# Patient Record
Sex: Female | Born: 1955
Health system: Southern US, Community
[De-identification: ages and names within clinical notes are randomized; demographics above are authoritative.]

## PROBLEM LIST (undated history)

## (undated) DIAGNOSIS — N943 Premenstrual tension syndrome: Secondary | ICD-10-CM

## (undated) DIAGNOSIS — N189 Chronic kidney disease, unspecified: Secondary | ICD-10-CM

## (undated) DIAGNOSIS — I10 Essential (primary) hypertension: Secondary | ICD-10-CM

## (undated) DIAGNOSIS — J309 Allergic rhinitis, unspecified: Secondary | ICD-10-CM

## (undated) DIAGNOSIS — E7800 Pure hypercholesterolemia, unspecified: Secondary | ICD-10-CM

## (undated) DIAGNOSIS — N951 Menopausal and female climacteric states: Secondary | ICD-10-CM

## (undated) DIAGNOSIS — D649 Anemia, unspecified: Secondary | ICD-10-CM

## (undated) DIAGNOSIS — Z8249 Family history of ischemic heart disease and other diseases of the circulatory system: Secondary | ICD-10-CM

## (undated) DIAGNOSIS — E039 Hypothyroidism, unspecified: Secondary | ICD-10-CM

## (undated) DIAGNOSIS — J45909 Unspecified asthma, uncomplicated: Secondary | ICD-10-CM

## (undated) DIAGNOSIS — R519 Headache, unspecified: Secondary | ICD-10-CM

## (undated) DIAGNOSIS — K219 Gastro-esophageal reflux disease without esophagitis: Secondary | ICD-10-CM

## (undated) DIAGNOSIS — R51 Headache: Secondary | ICD-10-CM

## (undated) DIAGNOSIS — E669 Obesity, unspecified: Secondary | ICD-10-CM

## (undated) DIAGNOSIS — R0602 Shortness of breath: Secondary | ICD-10-CM

## (undated) DIAGNOSIS — G8929 Other chronic pain: Secondary | ICD-10-CM

## (undated) HISTORY — DX: Headache, unspecified: R51.9

## (undated) HISTORY — DX: Anemia, unspecified: D64.9

## (undated) HISTORY — DX: Pure hypercholesterolemia, unspecified: E78.00

## (undated) HISTORY — DX: Chronic kidney disease, unspecified: N18.9

## (undated) HISTORY — DX: Allergic rhinitis, unspecified: J30.9

## (undated) HISTORY — DX: Premenstrual tension syndrome: N94.3

## (undated) HISTORY — DX: Shortness of breath: R06.02

## (undated) HISTORY — DX: Family history of ischemic heart disease and other diseases of the circulatory system: Z82.49

## (undated) HISTORY — DX: Other chronic pain: G89.29

## (undated) HISTORY — DX: Obesity, unspecified: E66.9

## (undated) HISTORY — DX: Essential (primary) hypertension: I10

## (undated) HISTORY — DX: Unspecified asthma, uncomplicated: J45.909

## (undated) HISTORY — DX: Headache: R51

## (undated) HISTORY — PX: OTHER SURGICAL HISTORY: SHX169

## (undated) HISTORY — PX: CARDIOVASCULAR STRESS TEST: SHX262

## (undated) HISTORY — DX: Gastro-esophageal reflux disease without esophagitis: K21.9

## (undated) HISTORY — DX: Hypothyroidism, unspecified: E03.9

## (undated) HISTORY — PX: TONSILLECTOMY: SUR1361

## (undated) HISTORY — DX: Menopausal and female climacteric states: N95.1

---

## 2006-12-25 ENCOUNTER — Ambulatory Visit: Payer: Self-pay | Admitting: Family Medicine

## 2006-12-25 LAB — CONVERTED CEMR LAB
AST: 19 units/L (ref 0–37)
Albumin: 4.1 g/dL (ref 3.5–5.2)
BUN: 14 mg/dL (ref 6–23)
Chloride: 104 meq/L (ref 96–112)
GFR calc non Af Amer: 46 mL/min
HDL: 59.1 mg/dL (ref >39.0)
LDL Cholesterol: 59 mg/dL (ref 0–99)
Potassium: 3.5 meq/L (ref 3.5–5.1)
Sodium: 139 meq/L (ref 135–145)
TSH: 3.93 microintl units/mL (ref 0.35–5.50)
VLDL: 27 mg/dL (ref 0–40)

## 2007-01-08 ENCOUNTER — Other Ambulatory Visit: Admission: RE | Admit: 2007-01-08 | Discharge: 2007-01-08 | Payer: Self-pay | Admitting: Family Medicine

## 2007-01-08 ENCOUNTER — Encounter: Payer: Self-pay | Admitting: Family Medicine

## 2007-01-08 ENCOUNTER — Ambulatory Visit: Payer: Self-pay | Admitting: Family Medicine

## 2007-01-08 LAB — CONVERTED CEMR LAB: Pap Smear: NORMAL

## 2007-01-17 ENCOUNTER — Ambulatory Visit: Payer: Self-pay | Admitting: Family Medicine

## 2007-01-21 ENCOUNTER — Encounter: Payer: Self-pay | Admitting: Family Medicine

## 2007-01-21 DIAGNOSIS — J309 Allergic rhinitis, unspecified: Secondary | ICD-10-CM | POA: Insufficient documentation

## 2007-01-21 DIAGNOSIS — N951 Menopausal and female climacteric states: Secondary | ICD-10-CM | POA: Insufficient documentation

## 2007-01-21 DIAGNOSIS — K219 Gastro-esophageal reflux disease without esophagitis: Secondary | ICD-10-CM | POA: Insufficient documentation

## 2007-01-21 DIAGNOSIS — J45909 Unspecified asthma, uncomplicated: Secondary | ICD-10-CM | POA: Insufficient documentation

## 2007-01-21 DIAGNOSIS — N943 Premenstrual tension syndrome: Secondary | ICD-10-CM | POA: Insufficient documentation

## 2007-01-21 DIAGNOSIS — E7800 Pure hypercholesterolemia, unspecified: Secondary | ICD-10-CM

## 2007-01-21 DIAGNOSIS — I10 Essential (primary) hypertension: Secondary | ICD-10-CM | POA: Insufficient documentation

## 2007-02-01 ENCOUNTER — Ambulatory Visit: Payer: Self-pay | Admitting: Family Medicine

## 2007-02-08 ENCOUNTER — Encounter: Payer: Self-pay | Admitting: Family Medicine

## 2007-04-05 ENCOUNTER — Encounter: Payer: Self-pay | Admitting: Family Medicine

## 2007-05-22 ENCOUNTER — Ambulatory Visit: Payer: Self-pay | Admitting: Family Medicine

## 2007-05-22 DIAGNOSIS — E039 Hypothyroidism, unspecified: Secondary | ICD-10-CM

## 2007-05-23 LAB — CONVERTED CEMR LAB
T3 Uptake Ratio: 38.9 % — ABNORMAL HIGH (ref 22.5–37.0)
T3, Free: 2.6 pg/mL (ref 2.3–4.2)

## 2007-08-05 ENCOUNTER — Encounter: Payer: Self-pay | Admitting: Family Medicine

## 2007-08-13 ENCOUNTER — Encounter: Payer: Self-pay | Admitting: Family Medicine

## 2007-09-04 ENCOUNTER — Telehealth: Payer: Self-pay | Admitting: Family Medicine

## 2007-11-18 ENCOUNTER — Ambulatory Visit: Payer: Self-pay | Admitting: Family Medicine

## 2007-11-22 LAB — CONVERTED CEMR LAB
AST: 24 units/L (ref 0–37)
Albumin: 3.9 g/dL (ref 3.5–5.2)
Alkaline Phosphatase: 56 units/L (ref 39–117)
Total Bilirubin: 0.5 mg/dL (ref 0.3–1.2)

## 2008-02-11 ENCOUNTER — Telehealth: Payer: Self-pay | Admitting: Family Medicine

## 2008-03-11 ENCOUNTER — Telehealth: Payer: Self-pay | Admitting: Family Medicine

## 2008-03-23 ENCOUNTER — Ambulatory Visit: Payer: Self-pay | Admitting: Family Medicine

## 2008-03-25 ENCOUNTER — Telehealth: Payer: Self-pay | Admitting: Family Medicine

## 2008-05-07 ENCOUNTER — Ambulatory Visit: Payer: Self-pay | Admitting: Family Medicine

## 2008-05-13 ENCOUNTER — Ambulatory Visit: Payer: Self-pay | Admitting: Family Medicine

## 2008-05-13 ENCOUNTER — Encounter: Payer: Self-pay | Admitting: Family Medicine

## 2008-05-14 ENCOUNTER — Encounter (INDEPENDENT_AMBULATORY_CARE_PROVIDER_SITE_OTHER): Payer: Self-pay | Admitting: *Deleted

## 2008-10-30 ENCOUNTER — Ambulatory Visit: Payer: Self-pay | Admitting: Family Medicine

## 2009-03-24 ENCOUNTER — Encounter: Payer: Self-pay | Admitting: Family Medicine

## 2009-06-08 ENCOUNTER — Ambulatory Visit: Payer: Self-pay | Admitting: Family Medicine

## 2009-06-09 ENCOUNTER — Encounter: Payer: Self-pay | Admitting: Family Medicine

## 2009-07-23 ENCOUNTER — Ambulatory Visit: Payer: Self-pay | Admitting: Family Medicine

## 2009-07-23 DIAGNOSIS — H6060 Unspecified chronic otitis externa, unspecified ear: Secondary | ICD-10-CM

## 2009-08-03 ENCOUNTER — Ambulatory Visit: Payer: Self-pay | Admitting: Family Medicine

## 2009-08-04 ENCOUNTER — Encounter (INDEPENDENT_AMBULATORY_CARE_PROVIDER_SITE_OTHER): Payer: Self-pay | Admitting: *Deleted

## 2009-08-11 ENCOUNTER — Encounter: Payer: Self-pay | Admitting: Family Medicine

## 2009-08-11 ENCOUNTER — Ambulatory Visit: Payer: Self-pay | Admitting: Family Medicine

## 2009-08-18 ENCOUNTER — Encounter (INDEPENDENT_AMBULATORY_CARE_PROVIDER_SITE_OTHER): Payer: Self-pay | Admitting: *Deleted

## 2009-09-16 ENCOUNTER — Ambulatory Visit: Payer: Self-pay | Admitting: Family Medicine

## 2009-09-17 LAB — CONVERTED CEMR LAB
ALT: 20 units/L (ref 0–35)
AST: 24 units/L (ref 0–37)
Bilirubin, Direct: 0 mg/dL (ref 0.0–0.3)
Chloride: 102 meq/L (ref 96–112)
Potassium: 3.3 meq/L — ABNORMAL LOW (ref 3.5–5.1)
Total Bilirubin: 0.8 mg/dL (ref 0.3–1.2)
Total CHOL/HDL Ratio: 3
VLDL: 41.2 mg/dL — ABNORMAL HIGH (ref 0.0–40.0)

## 2010-04-05 ENCOUNTER — Encounter: Payer: Self-pay | Admitting: Family Medicine

## 2010-05-03 ENCOUNTER — Telehealth: Payer: Self-pay | Admitting: Family Medicine

## 2010-05-04 ENCOUNTER — Encounter (INDEPENDENT_AMBULATORY_CARE_PROVIDER_SITE_OTHER): Payer: Self-pay | Admitting: *Deleted

## 2010-05-12 ENCOUNTER — Encounter (INDEPENDENT_AMBULATORY_CARE_PROVIDER_SITE_OTHER): Payer: Self-pay | Admitting: *Deleted

## 2010-05-12 LAB — CONVERTED CEMR LAB
AST: 21 units/L
Alkaline Phosphatase: 69 units/L
Calcium: 9.9 mg/dL
Creatinine, Ser: 1.19 mg/dL
Magnesium: 1.84 mg/dL
Total Bilirubin: 0.4 mg/dL
Triglyceride fasting, serum: 294 mg/dL — ABNORMAL HIGH

## 2010-08-11 LAB — HM MAMMOGRAPHY: HM Mammogram: NORMAL

## 2010-11-01 ENCOUNTER — Encounter: Payer: Self-pay | Admitting: Family Medicine

## 2010-11-01 ENCOUNTER — Other Ambulatory Visit: Payer: Self-pay | Admitting: Family Medicine

## 2010-11-01 ENCOUNTER — Telehealth (INDEPENDENT_AMBULATORY_CARE_PROVIDER_SITE_OTHER): Payer: Self-pay | Admitting: *Deleted

## 2010-11-01 ENCOUNTER — Ambulatory Visit
Admission: RE | Admit: 2010-11-01 | Discharge: 2010-11-01 | Payer: Self-pay | Source: Home / Self Care | Attending: Family Medicine | Admitting: Family Medicine

## 2010-11-01 LAB — HEPATIC FUNCTION PANEL
ALT: 37 U/L — ABNORMAL HIGH (ref 0–35)
AST: 41 U/L — ABNORMAL HIGH (ref 0–37)
Alkaline Phosphatase: 60 U/L (ref 39–117)
Bilirubin, Direct: 0 mg/dL (ref 0.0–0.3)
Total Protein: 7.1 g/dL (ref 6.0–8.3)

## 2010-11-01 LAB — BASIC METABOLIC PANEL
CO2: 31 mEq/L (ref 19–32)
Calcium: 9.3 mg/dL (ref 8.4–10.5)
Chloride: 100 mEq/L (ref 96–112)
Potassium: 3.7 mEq/L (ref 3.5–5.1)
Sodium: 140 mEq/L (ref 135–145)

## 2010-11-01 LAB — CONVERTED CEMR LAB: LDL Cholesterol: 30 mg/dL

## 2010-11-01 LAB — LIPID PANEL: Total CHOL/HDL Ratio: 2

## 2010-11-08 NOTE — Miscellaneous (Signed)
Summary: med list update- change from vytorin to crestor  Clinical Lists Changes  Medications: Changed medication from VYTORIN 10-20 MG TABS (EZETIMIBE-SIMVASTATIN) Take 1 tablet by mouth once a day to CRESTOR 40 MG TABS (ROSUVASTATIN CALCIUM) take one by mouth at bedtime     Prior Medications: HYDROCHLOROTHIAZIDE 25 MG TABS (HYDROCHLOROTHIAZIDE) Take 1 tablet by mouth once a day IMITREX 20 MG/ACT SOLN (SUMATRIPTAN) Spray 1 spray into one nostril once a day ATENOLOL 50 MG  TABS (ATENOLOL) Take 1 tablet by mouth once a day PRILOSEC OTC 20 MG  TBEC (OMEPRAZOLE MAGNESIUM) .Take 1 tablet by mouth once a day CALCIUM CARBONATE-VITAMIN D 600-400 MG-UNIT  TABS (CALCIUM CARBONATE-VITAMIN D) Take 1 tablet by mouth once a day DICLOFENAC SODIUM 75 MG TBEC (DICLOFENAC SODIUM) 1 tab by mouth two times a day HYDROCORTISONE-ACETIC ACID 1-2 % SOLN (HYDROCORTISONE-ACETIC ACID) 5 gtt in right ear three times a day x 5-7 days Current Allergies: * PENICILLINS GROUP * SULFA (SULFONAMIDES) GROUP

## 2010-11-08 NOTE — Progress Notes (Signed)
Summary: prior Berkley Harvey is needed for vytorin  Phone Note From Pharmacy   Caller: [facility]  [phone]*/ [facility] Summary of Call: Prior Berkley Harvey is needed for vytorin, form is on your desk.  Insurance prefers [employer]. Initial call taken by: Lowella Petties CMA,  May 03, 2010 1:19 PM  Follow-up for Phone Call        let patient know  Insurance company and I agree. High potency statin drugs have been found to be more favorable in preventing cardiac events - the 1 component in Vytorin (Zetia) has not been found to be helpful.  Would recommend med change. Follow-up by: Hannah Beat MD,  May 03, 2010 1:30 PM  Additional Follow-up for Phone Call Additional follow up Details #1::        Advised pt, she is agreeable to change.             Lowella Petties CMA  May 04, 2010 11:04 AM

## 2010-11-08 NOTE — Miscellaneous (Signed)
  Clinical Lists Changes  Observations: Added new observation of MAGNESIUM: 1.84 mg/dL (91/47/8295 6:21) Added new observation of CALCIUM: 9.9 mg/dL (30/86/5784 6:96) Added new observation of CREATININE: 1.19 mg/dL (29/52/8413 2:44) Added new observation of BUN: 23 mg/dL (10/11/7251 6:64) Added new observation of CL SERUM: 103 meq/L (05/12/2010 8:19) Added new observation of K SERUM: 4.2 meq/L (05/12/2010 8:19) Added new observation of NA: 142 meq/L (05/12/2010 8:19) Added new observation of BG RANDOM: 96 mg/dL (40/34/7425 9:56) Added new observation of TSH: 4.94 microintl units/mL (05/12/2010 8:19) Added new observation of BILI TOTAL: 0.4 mg/dL (38/75/6433 2:95) Added new observation of ALK PHOS: 69 units/L (05/12/2010 8:19) Added new observation of SGPT (ALT): 14 units/L (05/12/2010 8:19) Added new observation of SGOT (AST): 21 units/L (05/12/2010 8:19) Added new observation of TRIGLYCERIDE: 294 mg/dL (18/84/1660 6:30) Added new observation of LDL: 146 mg/dL (16/10/930 3:55) Added new observation of HDL: 53 mg/dL (73/22/0254 2:70) Added new observation of CHOLESTEROL: 258 mg/dL (62/37/6283 1:51)

## 2010-11-10 NOTE — Progress Notes (Signed)
----   Converted from flag ---- ---- 10/31/2010 10:54 PM, Kerby Nora MD wrote: Francisca December, lipids Dx 401.1  ---- 10/28/2010 11:31 AM, Liane Comber CMA (AAMA) wrote: Lab orders please! Good Morning! This pt is scheduled for cpx labs Tuesday, which labs to draw and dx codes to use? Thanks Tasha ------------------------------

## 2010-12-02 ENCOUNTER — Other Ambulatory Visit: Payer: Self-pay | Admitting: Family Medicine

## 2010-12-02 ENCOUNTER — Other Ambulatory Visit (HOSPITAL_COMMUNITY)
Admission: RE | Admit: 2010-12-02 | Discharge: 2010-12-02 | Disposition: A | Discharge status: Home / Self Care | Payer: Managed Care, Other (non HMO) | Source: Ambulatory Visit | Attending: Family Medicine | Admitting: Family Medicine

## 2010-12-02 ENCOUNTER — Encounter (INDEPENDENT_AMBULATORY_CARE_PROVIDER_SITE_OTHER): Payer: Managed Care, Other (non HMO) | Admitting: Family Medicine

## 2010-12-02 ENCOUNTER — Encounter: Payer: Self-pay | Admitting: Family Medicine

## 2010-12-02 DIAGNOSIS — Z01419 Encounter for gynecological examination (general) (routine) without abnormal findings: Secondary | ICD-10-CM

## 2010-12-02 DIAGNOSIS — R740 Nonspecific elevation of levels of transaminase and lactic acid dehydrogenase [LDH]: Secondary | ICD-10-CM

## 2010-12-02 DIAGNOSIS — Z1159 Encounter for screening for other viral diseases: Secondary | ICD-10-CM | POA: Insufficient documentation

## 2010-12-02 DIAGNOSIS — Z0000 Encounter for general adult medical examination without abnormal findings: Secondary | ICD-10-CM

## 2010-12-02 DIAGNOSIS — L408 Other psoriasis: Secondary | ICD-10-CM | POA: Insufficient documentation

## 2010-12-02 LAB — CONVERTED CEMR LAB
Cholesterol, target level: 200 mg/dL
HDL goal, serum: 40 mg/dL
LDL Goal: 160 mg/dL

## 2010-12-02 LAB — HM PAP SMEAR

## 2010-12-06 NOTE — Assessment & Plan Note (Signed)
Summary: CPX/AETNA/CYD   Vital Signs:  Patient profile:   55 year old female Height:      67 inches Weight:      201 pounds BMI:     31.59 Temp:     98.3 degrees F oral Pulse rate:   74 / minute Pulse rhythm:   regular BP sitting:   120 / 77  (left arm) Cuff size:   large  Vitals Entered By: Melody Comas (December 02, 2010 2:21 PM) CC: cpx , Lipid Management   History of Present Illness: The patient is here for annual wellness exam and preventative care.    Last seen this pt for CPX in 2010.   She has the following chronic issues:  HTN, well controlled on atenolol and HCTZ.Marland Kitchen good numbers at home as well.  Hypothyroid: well controlled. Last TSH 4.94 05/2010  Has started daily exercsie routine.  Lipid Management History:      Positive NCEP/ATP III risk factors include hypertension.  Negative NCEP/ATP III risk factors include female age less than 66 years old and non-tobacco-user status.        Comments include: LDL very low.. consider decreasing dose from such a high level given elevation of LFTS.  The patient notes symptoms to suggest myopathy or liver disease.  Comments include: Elevated LFTs on crestor 40 mg daily .  Comments: Triglcyerides remain high.   Problems Prior to Update: 1)  Otitis Externa, Chronic  (ICD-380.23) 2)  Trochanteric Bursitis, Left  (ICD-726.5) 3)  Hip Pain, Left  (ICD-719.45) 4)  Routine Gynecological Examination  (ICD-V72.31) 5)  Other Screening Mammogram  (ICD-V76.12) 6)  Well Adult Exam  (ICD-V70.0) 7)  Hypothyroidism Nos  (ICD-244.9) 8)  Family History of Cad Female 1st Degree Relative <50  (ICD-V17.3) 9)  Renal Insufficiency, Chronic 1.2 - 1.3  (ICD-585.9) 10)  Obesity, Bmi 31  (ICD-278.00) 11)  Gerd  (ICD-530.81) 12)  Asthma, Childhood  (ICD-493.00) 13)  Allergic Rhinitis  (ICD-477.9) 14)  Menopausal Syndrome  (ICD-627.2) 15)  Migraine, Menstrual  (ICD-625.4) 16)  Hypertension  (ICD-401.9) 17)  Hypercholesterolemia   (ICD-272.0)  Current Medications (verified): 1)  Hydrochlorothiazide 25 Mg Tabs (Hydrochlorothiazide) .... Take 1 Tablet By Mouth Once A Day 2)  Crestor 40 Mg Tabs (Rosuvastatin Calcium) .... Take One By Mouth At Bedtime 3)  Imitrex 20 Mg/act Soln (Sumatriptan) .... Spray 1 Spray Into One Nostril Once A Day 4)  Atenolol 50 Mg  Tabs (Atenolol) .... Take 1 Tablet By Mouth Once A Day 5)  Prilosec Otc 20 Mg  Tbec (Omeprazole Magnesium) .... .take 1 Tablet By Mouth Once A Day 6)  Calcium Carbonate-Vitamin D 600-400 Mg-Unit  Tabs (Calcium Carbonate-Vitamin D) .... Take 1 Tablet By Mouth Once A Day 7)  Diclofenac Sodium 75 Mg Tbec (Diclofenac Sodium) .Marland Kitchen.. 1 Tab By Mouth Two Times A Day 8)  Hydrocortisone-Acetic Acid 1-2 % Soln (Hydrocortisone-Acetic Acid) .... 5 Gtt in Right Ear Three Times A Day X 5-7 Days 9)  Fluocinonide 0.05 % Crea (Fluocinonide) .... Apply To Scalp 1 To 2 Times Daily  Allergies: 1)  * Penicillins Group 2)  * Sulfa (Sulfonamides) Group  Past History:  Past medical, surgical, family and social histories (including risk factors) reviewed, and no changes noted (except as noted below).  Past Medical History: Reviewed history from 10/30/2008 and no changes required.  HYPOTHYROIDISM NOS (ICD-244.9) FAMILY HISTORY OF CAD FEMALE 1ST DEGREE RELATIVE <50 (ICD-V17.3) RENAL INSUFFICIENCY, CHRONIC  1.2 - 1.3 (ICD-585.9) OBESITY, BMI 31 (ICD-278.00)  GERD (ICD-530.81) ASTHMA, CHILDHOOD (ICD-493.00) ALLERGIC RHINITIS (ICD-477.9) MENOPAUSAL SYNDROME (ICD-627.2) MIGRAINE, MENSTRUAL (ICD-625.4) HYPERTENSION (ICD-401.9) HYPERCHOLESTEROLEMIA (ICD-272.0)    Past Surgical History: Reviewed history from 01/21/2007 and no changes required. 1975 TONSILLECTOMY 1996 EXP. LAP. - LEFT OVARIAN REPAIR 2004 EGD:  GERD  Family History: Reviewed history from 01/21/2007 and no changes required. Father: ALIVE 102 CAD, CABG, DM, MI AT AGE 5 Mother: DIED 18, HTN, CVA, DM Siblings: 3 BROTHERS HTN,  HTN, DRUG ABUSE CV:  PATERNAL SIDE Family History of CAD Female 1st degree relative  Social History: Reviewed history from 01/21/2007 and no changes required. Marital Status: Married Children: 2 GROWN, HEALTHY, FIBROMYALGIA Occupation: Clinical biochemist, [employer] DIET:  OCC. FAST FOOD, FRUITS AND VEGGIES, + H20 Never Smoked Alcohol use-no Regular exercise-no  Review of Systems General:  Denies fatigue and fever. CV:  Denies chest pain or discomfort. Resp:  Denies shortness of breath. GI:  Denies abdominal pain, bloody stools, and constipation. GU:  Denies abnormal vaginal bleeding and dysuria. MS:  intermittant B hip pain.Marland Kitchen in past has had trochanteric bursitis.Marland Kitchen Psych:  Denies anxiety and depression.  Physical Exam  General:  overweight appearing female in NAD  Eyes:  No corneal or conjunctival inflammation noted. EOMI. Perrla. Funduscopic exam benign, without hemorrhages, exudates or papilledema. Vision grossly normal. Ears:  External ear exam shows no significant lesions or deformities.  Otoscopic examination reveals clear canals, tympanic membranes are intact bilaterally without bulging, retraction, inflammation or discharge. Hearing is grossly normal bilaterally. Nose:  External nasal examination shows no deformity or inflammation. Nasal mucosa are pink and moist without lesions or exudates. Mouth:  Oral mucosa and oropharynx without lesions or exudates.  Teeth in good repair. Neck:  no carotid bruit or thyromegaly no cervical or supraclavicular lymphadenopathy  Chest Wall:  No deformities, masses, or tenderness noted. Breasts:  No mass, nodules, thickening, tenderness, bulging, retraction, inflamation, nipple discharge or skin changes noted.   Lungs:  Normal respiratory effort, chest expands symmetrically. Lungs are clear to auscultation, no crackles or wheezes. Heart:  Normal rate and regular rhythm. S1 and S2 normal without gallop, murmur, click, rub or other extra  sounds. Abdomen:  Bowel sounds positive,abdomen soft and non-tender without masses, organomegaly or hernias noted. Genitalia:  Pelvic Exam:        External: normal female genitalia without lesions or masses        Vagina: normal without lesions or masses        Cervix: normal without lesions or masses        Adnexa: normal bimanual exam without masses or fullness        Uterus: normal by palpation        Pap smear: performed Msk:  No deformity or scoliosis noted of thoracic or lumbar spine.   ttp B trochanteric bursa.  Pulses:  R and L posterior tibial pulses are full and equal bilaterally  Extremities:  no edema Skin:  Intact without suspicious lesions or rashes Psych:  Cognition and judgment appear intact. Alert and cooperative with normal attention span and concentration. No apparent delusions, illusions, hallucinations   Impression & Recommendations:  Problem # 1:  WELL ADULT EXAM (ICD-V70.0) The patient's preventative maintenance and recommended screening tests for an annual wellness exam were reviewed in full today. Brought up to date unless services declined.  Counselled on the importance of diet, exercise, and its role in overall health and mortality. The patient's FH and SH was reviewed, including their home life, tobacco status, and drug  and alcohol status.     Problem # 2:  ROUTINE GYNECOLOGICAL EXAMINATION (ICD-V72.31) PAP pending.   Problem # 3:  HYPOTHYROIDISM NOS (ICD-244.9) Well controlled. Continue current medication.   Problem # 4:  HYPERTENSION (ICD-401.9) Well controlled. Continue current medication.  Her updated medication list for this problem includes:    Hydrochlorothiazide 25 Mg Tabs (Hydrochlorothiazide) .Marland Kitchen... Take 1 tablet by mouth once a day    Atenolol 50 Mg Tabs (Atenolol) .Marland Kitchen... Take 1 tablet by mouth once a day  Problem # 5:  HYPERCHOLESTEROLEMIA (ICD-272.0) Slight increase in LFTS... goal LDL simply <100-130... will decrease to crestor 20 mg  daily to see if chol remoians at goal on lower dose and LFTs return to nml.  Her updated medication list for this problem includes:    Crestor 40 Mg Tabs (Rosuvastatin calcium) .Marland Kitchen... Change to 1/2 tab by mouth daily  Problem # 6:  RENAL INSUFFICIENCY, CHRONIC  1.2 - 1.3 (ICD-585.9) Stable.   Complete Medication List: 1)  Hydrochlorothiazide 25 Mg Tabs (Hydrochlorothiazide) .... Take 1 tablet by mouth once a day 2)  Crestor 40 Mg Tabs (Rosuvastatin calcium) .... Change to 1/2 tab by mouth daily 3)  Imitrex 20 Mg/act Soln (Sumatriptan) .... Spray 1 spray into one nostril once a day 4)  Atenolol 50 Mg Tabs (Atenolol) .... Take 1 tablet by mouth once a day 5)  Prilosec Otc 20 Mg Tbec (Omeprazole magnesium) .... .take 1 tablet by mouth once a day 6)  Calcium Carbonate-vitamin D 600-400 Mg-unit Tabs (Calcium carbonate-vitamin d) .... Take 1 tablet by mouth once a day 7)  Diclofenac Sodium 75 Mg Tbec (Diclofenac sodium) .Marland Kitchen.. 1 tab by mouth two times a day 8)  Hydrocortisone-acetic Acid 1-2 % Soln (Hydrocortisone-acetic acid) .... 5 gtt in right ear three times a day x 5-7 days 9)  Fluocinonide 0.05 % Crea (Fluocinonide) .... Apply to scalp 1 to 2 times daily  Other Orders: Radiology Referral (Radiology)  Lipid Assessment/Plan:      Based on NCEP/ATP III, the patient's risk factor category is "0-1 risk factors".  The patient's lipid goals are as follows: Total cholesterol goal is 200; LDL cholesterol goal is 160; HDL cholesterol goal is 40; Triglyceride goal is 150.  Her LDL cholesterol goal has been met.    Patient Instructions: 1)  Start back fish oil 2000 mg daily... try orange or lemon.  2)  Decrease crestor to 1/2 tab by mouth daily. 3)  Recheck fasting LIPIDS, AST, ALT  in 3 months Dx 272.0    4)  Work on [employer] and weight loss. Low chol low fat diet. 5)   Referral Appointment Information 6)  Day/Date: 7)  Time: 8)  Place/MD: 9)  Address: 10)  Phone/Fax: 11)  Patient given  appointment information. Information/Orders faxed/mailed.    Orders Added: 1)  Radiology Referral [Radiology] 2)  Est. Patient 40-64 years [[id number]]    Current Allergies (reviewed today): * PENICILLINS GROUP * SULFA (SULFONAMIDES) GROUP   Past Medical History:    Reviewed history from 10/30/2008 and no changes required:              HYPOTHYROIDISM NOS (ICD-244.9)       FAMILY HISTORY OF CAD FEMALE 1ST DEGREE RELATIVE <50 (ICD-V17.3)       RENAL INSUFFICIENCY, CHRONIC  1.2 - 1.3 (ICD-585.9)       OBESITY, BMI 31 (ICD-278.00)       GERD (ICD-530.81)       ASTHMA, CHILDHOOD (ICD-493.00)  ALLERGIC RHINITIS (ICD-477.9)       MENOPAUSAL SYNDROME (ICD-627.2)       MIGRAINE, MENSTRUAL (ICD-625.4)       HYPERTENSION (ICD-401.9)       HYPERCHOLESTEROLEMIA (ICD-272.0)          Past Surgical History:    Reviewed history from 01/21/2007 and no changes required:       1975 TONSILLECTOMY       1996 EXP. LAP. - LEFT OVARIAN REPAIR       2004 EGD:  GERD   Flu Vaccine Next Due:  Refused Last LDL:  146 (05/12/2010 8:19:28 AM) LDL Result Date:  11/01/2010 LDL Result:  30 LDL Next Due:  12 wk

## 2010-12-06 NOTE — Letter (Signed)
Summary: [facility]: Immunoassay Fecal Occult Blood (iFOB) Order Form  [place] [facility]  [address] [place], [place] [address]   Phone: [phone]  Fax: [phone]      [facility]: Immunoassay Fecal Occult Blood (iFOB) Order Form   December 02, 2010 MRN: [id number]   SOJOURNER BEHRINGER [birth year]   Physicican Name:_______________bedsole__________  Diagnosis Code:________________v76.41__________      Kerby Nora MD

## 2010-12-12 ENCOUNTER — Encounter (INDEPENDENT_AMBULATORY_CARE_PROVIDER_SITE_OTHER): Payer: Self-pay | Admitting: *Deleted

## 2010-12-20 NOTE — Letter (Signed)
Summary: Results Follow up Letter  [place] [facility]  [address] [place], [place] [address]   Phone: [phone]  Fax: [phone]    12/12/2010 MRN: [id number]     [facility] [address] [place], [place]  [address]  [place]    Dear Ms. BAZIN,  The following are the results of your recent test(s):  Test         Result    Pap Smear:        Normal __x___  Not Normal _____ Comments:Repeat in 1 year ______________________________________________________ Cholesterol: LDL(Bad cholesterol):         Your goal is less than:         HDL (Good cholesterol):       Your goal is more than: Comments:  ______________________________________________________ Mammogram:        Normal _____  Not Normal _____ Comments:  ___________________________________________________________________ Hemoccult:        Normal _____  Not normal _______ Comments:    _____________________________________________________________________ Other Tests:    We routinely do not discuss normal results over the telephone.  If you desire a copy of the results, or you have any questions about this information we can discuss them at your next office visit.   Sincerely,  Kerby Nora MD

## 2011-01-04 ENCOUNTER — Other Ambulatory Visit: Payer: Managed Care, Other (non HMO)

## 2011-01-04 ENCOUNTER — Other Ambulatory Visit (INDEPENDENT_AMBULATORY_CARE_PROVIDER_SITE_OTHER): Payer: Managed Care, Other (non HMO) | Admitting: Family Medicine

## 2011-01-04 DIAGNOSIS — Z1211 Encounter for screening for malignant neoplasm of colon: Secondary | ICD-10-CM

## 2011-01-04 LAB — FECAL OCCULT BLOOD, IMMUNOCHEMICAL: Fecal Occult Bld: NEGATIVE

## 2011-01-26 ENCOUNTER — Other Ambulatory Visit: Payer: Self-pay | Admitting: Podiatry

## 2011-01-30 ENCOUNTER — Ambulatory Visit: Payer: Self-pay | Admitting: Family Medicine

## 2011-02-21 ENCOUNTER — Encounter: Payer: Self-pay | Admitting: Family Medicine

## 2011-02-23 ENCOUNTER — Ambulatory Visit (INDEPENDENT_AMBULATORY_CARE_PROVIDER_SITE_OTHER): Payer: Managed Care, Other (non HMO) | Admitting: Family Medicine

## 2011-02-23 ENCOUNTER — Encounter: Payer: Self-pay | Admitting: Family Medicine

## 2011-02-23 VITALS — BP 110/80 | HR 60 | Temp 97.8°F | Ht 67.0 in | Wt 199.8 lb

## 2011-02-23 DIAGNOSIS — M21619 Bunion of unspecified foot: Secondary | ICD-10-CM

## 2011-02-23 DIAGNOSIS — M2020 Hallux rigidus, unspecified foot: Secondary | ICD-10-CM

## 2011-02-23 DIAGNOSIS — M109 Gout, unspecified: Secondary | ICD-10-CM | POA: Insufficient documentation

## 2011-02-23 MED ORDER — ALLOPURINOL 300 MG PO TABS: 300.0000 mg | ORAL_TABLET | Freq: Every day | ORAL | Status: DC

## 2011-02-23 MED ORDER — ALLOPURINOL 100 MG PO TABS: ORAL_TABLET | ORAL | Status: DC

## 2011-02-23 NOTE — Patient Instructions (Signed)
Allopurinol 100 mg, take one tablet for 2 weeks, then increase to 2 tablets a day for 2 weeks.  Then fill the refill: 300 mg tablet once a day

## 2011-02-23 NOTE — Progress Notes (Signed)
55 year old, f/u gout:  Since March, started slowly, went to see Dr. Ether Griffins.  Went on a week of steroids, and then on the thrid visit. Found out it wsa gout.   Better, but it wsa still not great.  Right now, improved but some baseline MTP pain  Gout: Patient here for evaluation of acute gouty arthritis. The patient reports 3 attacks involving the left first MTP joint. Attacks occur primarily in the left first MTP joint. Patient reports her chronic pain is improved, her joint stiffness is ongoing and her joint swelling is improved. Limitation on activities include none. The patient is not avoiding high purine foods and reports consuming 0 alcoholic drinks per days.  Crystal proven gout on aspirate. S/p rounds of colchicine, NSAIDS, oral steroids, most recently with intraarticular MTP injection. Mostly improved, but patient has her baseline pain and a baseline significant bunion and bunionette formation.  The PMH, PSH, Social History, Family History, Medications, and allergies have been reviewed in [facility], and have been updated if relevant.  REVIEW OF SYSTEMS  GEN: No fevers, chills. Nontoxic. Primarily MSK c/o today. MSK: Detailed in the HPI GI: tolerating PO intake without difficulty Neuro: No numbness, parasthesias, or tingling associated. Otherwise the pertinent positives of the ROS are noted above.   GEN: Well-developed,well-nourished,in no acute distress; alert,appropriate and cooperative throughout examination HEENT: Normocephalic and atraumatic without obvious abnormalities. Ears, externally no deformities PULM: Breathing comfortably in no respiratory distress EXT: No clubbing, cyanosis, or edema PSYCH: Normally interactive. Cooperative during the interview. Pleasant. Friendly and conversant. Not anxious or depressed appearing. Normal, full affect.  FEET: B Echymosis: no Edema: no ROM: full LE B Gait: heel toe, non-antalgic MT pain: no Lateral Mall: NT Medial Mall: NT Talus:  NT Navicular: NT Cuboid: NT Calcaneous: NT Metatarsals: NT 5th MT: NT Phalanges: NT Achilles: NT Plantar Fascia: NT Fat Pad: NT Peroneals: NT Post Tib: NT Great Toe: decreased motion, significant bunion, L > R, Mild MTP joint line pain at 1st on L Ant Drawer: neg ATFL: NT CFL: NT Deltoid: NT Sensation: intact  A/P: chronic gout. The patient seems as if she is over her acute flare. We'll start allopurinol and titrate up dosing until 300 mg is achieved. Recheck uric acid level at next office visit.  Significant bunion formation. Notable bunionette formation as well. Significant transverse arch collapse. She is considering bunionectomy. Recommended that she fully discuss surgical outcome literature with operating physician and recovery time.  Hallux rigidus. Suspect a significant amount of her pain is due to chronic changes in the MTP as well as bunion formation as well.  If the patient does acutely flared on allopurinol, in the next few days, we'll have to stop this and then reinitiate aggressive acute gout treatment.

## 2011-02-24 NOTE — Assessment & Plan Note (Signed)
[facility]                           [facility] OFFICE NOTE   NAME:Virginia Wilson, Virginia Wilson                           MRN:          [id number]  DATE:12/25/2006                            DOB:          [birth year]    NEW PATIENT HISTORY AND PHYSICAL:   CHIEF COMPLAINT:  [age]-year-old white female, here to establish new  doctor.   HISTORY OF PRESENT ILLNESS:  [name] moved from [place] with her  husband to be closer to her daughter.  She comes to clinic for the  following chronic issues:   1. Hypercholesterolemia, well-controlled:  She currently takes Vytorin      10/20 mg daily with well-controlled cholesterol.  Her cholesterol      was last evaluated in June 2007 and LDL was 83, HDL 47 and      triglycerides slightly elevated at 193.  She denies any myalgia or      arthralgia on Vytorin.  2. Hypertension, poor control:  She stopped temporarily her blood      pressure medication off and on since she moved, but noticed that      her blood pressure did increase and her migraines did increase, as      well, with the elevation in blood pressure.  She did have a blood      pressure measurement of 153/112.  When she takes the      hydrochlorothiazide 25 mg daily, she states her blood pressure      usually runs 130/90.  She denies any side effects from      hydrochlorothiazide.  She has been out of her medication now for      the last week.  3. Elevated TSH:  She comes to clinic with her last blood test that      shows an elevated TSH of 6.56 that suggests possible      hypothyroidism.  She denies fatigue, cold intolerance, coarse hair      or new dry skin.  She is unsure of any family history of low      thyroid.  She states that it had been elevated somewhat, but it was      being followed by her previous doctor.   REVIEW OF SYSTEMS:  Headaches, approximately one every few months.  Wears glasses.  No hearing problems.  No shortness of breath, no recent  chest pain, no cough, no nausea, vomiting or diarrhea, diarrhea,  constipation or rectal bleeding.  She does have some dysphagia if she  misses her Nexium dose and has been unable to wean off this in the past.  She did have an EGD in the past that was normal, except for reflux.   PAST MEDICAL HISTORY:  1. Hypercholesterolemia.  2. Hypertension.  3. Menstrual migraines.  4. Menopausal syndrome.  5. Allergic rhinitis.  6. Childhood asthma.  7. GERD.   HOSPITALIZATIONS SURGERIES PROCEDURES:  1. In 1975, tonsillectomy.  2. In 1996, exploratory laparotomy, laparoscopy, left ovarian repair.  3. In 2004, EGD, showing GERD.   ALLERGIES:  To SULFA and PENICILLIN.  MEDICATIONS:  1. Hydrochlorothiazide 25 mg daily.  2. Nexium 40 mg daily.  3. Vytorin 10/20 mg daily.  4. Imitrex 20 mg nasal spray p.r.n.   SOCIAL HISTORY:  No tobacco, no alcohol, no drug use.  She works in  Clinical biochemist at [employer].  She is married.  She has two  grown kids, who are healthy, except one does have fibromyalgia.  She  does not get regular exercise.  She occasionally eats fast food and only  occasionally gets fruits and vegetables.  She describes her diet as  poor.  She does drink lots of water.   FAMILY HISTORY:  Father alive at age 46 with first heart attack at age  11.  He has coronary artery disease and is now status post two coronary  artery bypass grafts.  He also has diabetes.  Mother deceased at age 33  with hypertension, massive stroke and diabetes.  She has three brothers,  two with hypertension and one with drug abuse.  There is also a family  history of coronary artery disease on her paternal side.  There is no  family history of any type of cancer.   PHYSICAL EXAM:  VITAL SIGNS:  Height 5 feet 7 inches, weight 207, making  BMI 31.  Blood pressure 135/95, pulse 76, temperature 98.3.  GENERAL:  Healthy-appearing female, in no apparent distress.  HEENT:  PERRLA.  Extraocular  muscles are intact.  Oropharynx clear.  Tympanic membranes clear.  No papilledema, no thyromegaly, no  lymphadenopathy.  CARDIOVASCULAR:  Regular rate and rhythm, no murmurs, rubs or gallops.  Normal PMI, 2+ peripheral pulses, no peripheral edema.  PULMONARY:  Clear to auscultation bilaterally, no wheezes, rales or  rhonchi.  ABDOMEN:  Soft, nontender, no active bowel sounds, no  hepatosplenomegaly.  MUSCULOSKELETAL:  Strength 5/5 in upper and lower extremities.  NEUROLOGIC:  Cranial nerves II through XII grossly intact.  Reflexes:  Patellar 2+, sensation intact in upper and lower extremities.   ASSESSMENT AND PLAN:  1. Hypertension, poor control:  We will restart her on      hydrochlorothiazide 25 mg daily.  She will let me know if this does      not bring her blood pressure down over the next few weeks.  We also      checked a basic metabolic panel to make sure her electrolytes are      in balance.  2. Hypercholesterolemia, well-controlled:  We will continue Vytorin.      She is due for a check on her LFTs and would also like to have her      fasting lipid panel evaluated.  Refill of Vytorin given.  3. Elevated TSH:  I will re-evaluate a TSH and if this still is      elevated or is further elevated, we can consider evaluating with      free T3 and free T4.  The patient is asymptomatic at this point in      time.  4. Migraines, chronic:  Refill of Imitrex nasal spray given.   PREVENTION:  She will have a mammogram scheduled.  She is scheduled to  return for a Pap smear.  We briefly discussed colon cancer screening and  she will consider her options between colonoscopy and Hemoccult cards.  I will obtain records to review when her last tetanus was done and if  anything else is pending.  We did discuss healthy eating habits and  regular exercise and I have  instructed her to begin addressing these.   NOTE:  Nexium was also refilled today.  Patient will return following an  appointment for her Pap smear for six-  months high blood pressure check.     Kerby Nora, MD  Electronically Signed    AB/MedQ  DD: 12/25/2006  DT: 12/25/2006  Job #: [id number]

## 2011-03-01 ENCOUNTER — Encounter: Payer: Self-pay | Admitting: Family Medicine

## 2011-03-01 ENCOUNTER — Other Ambulatory Visit (INDEPENDENT_AMBULATORY_CARE_PROVIDER_SITE_OTHER): Payer: Managed Care, Other (non HMO) | Admitting: Family Medicine

## 2011-03-01 DIAGNOSIS — E7800 Pure hypercholesterolemia, unspecified: Secondary | ICD-10-CM

## 2011-03-01 LAB — LIPID PANEL
Cholesterol: 188 mg/dL (ref 0–200)
Total CHOL/HDL Ratio: 3

## 2011-03-01 LAB — AST: AST: 31 U/L (ref 0–37)

## 2011-03-01 LAB — LDL CHOLESTEROL, DIRECT: Direct LDL: 67.8 mg/dL

## 2011-03-02 ENCOUNTER — Other Ambulatory Visit: Payer: Managed Care, Other (non HMO)

## 2011-03-05 ENCOUNTER — Telehealth: Payer: Self-pay | Admitting: Family Medicine

## 2011-03-05 ENCOUNTER — Other Ambulatory Visit: Payer: Self-pay | Admitting: Family Medicine

## 2011-03-05 DIAGNOSIS — E782 Mixed hyperlipidemia: Secondary | ICD-10-CM

## 2011-03-05 NOTE — Telephone Encounter (Signed)
Notify pt that liver function tests are back to normla on lower dose of crestor 20 mg daily. Triglycerides are elevated further though. LDL remains at goal.  Have her start fish oil 2000 mg divided daily to treat and continue lower dose crestor. Recheck chol in 3 months

## 2011-03-07 NOTE — Telephone Encounter (Signed)
Patient advised and will call for appt later

## 2011-04-05 ENCOUNTER — Telehealth: Payer: Self-pay | Admitting: *Deleted

## 2011-04-05 NOTE — Telephone Encounter (Signed)
Patient dropped off lab results from labs she had done at work. Copy of labs are on your desk.

## 2011-04-07 LAB — LIPID PANEL: Triglycerides: 173 mg/dL — AB (ref 40–160)

## 2011-04-07 LAB — BASIC METABOLIC PANEL: Glucose: 111 mg/dL

## 2011-04-27 ENCOUNTER — Encounter: Payer: Self-pay | Admitting: Family Medicine

## 2011-04-28 ENCOUNTER — Other Ambulatory Visit: Payer: Self-pay | Admitting: *Deleted

## 2011-04-28 MED ORDER — ALLOPURINOL 300 MG PO TABS: 300.0000 mg | ORAL_TABLET | Freq: Every day | ORAL | Status: DC

## 2011-05-17 ENCOUNTER — Ambulatory Visit: Payer: Self-pay | Admitting: Podiatry

## 2011-06-08 ENCOUNTER — Other Ambulatory Visit: Payer: Self-pay | Admitting: *Deleted

## 2011-06-08 MED ORDER — ROSUVASTATIN CALCIUM 20 MG PO TABS: 20.0000 mg | ORAL_TABLET | Freq: Every day | ORAL | Status: DC

## 2011-06-15 ENCOUNTER — Telehealth: Payer: Self-pay | Admitting: *Deleted

## 2011-06-15 NOTE — Telephone Encounter (Signed)
Received fax stating that Crestor 20mg  is not covered under patients insurance plan without prior authorization.  They offer several alternatives, form in your IN box.  Please advise.

## 2011-06-22 ENCOUNTER — Telehealth: Payer: Self-pay | Admitting: *Deleted

## 2011-06-22 NOTE — Telephone Encounter (Signed)
Prior Virginia Wilson is needed for crestor, unless it's changed to a preferred alternative.  Forms for prior auth or changing are on your desk.

## 2011-06-22 NOTE — Telephone Encounter (Signed)
Forms completed and in inbox.

## 2011-06-27 ENCOUNTER — Other Ambulatory Visit (INDEPENDENT_AMBULATORY_CARE_PROVIDER_SITE_OTHER): Payer: Managed Care, Other (non HMO)

## 2011-06-27 DIAGNOSIS — E782 Mixed hyperlipidemia: Secondary | ICD-10-CM

## 2011-06-27 LAB — COMPREHENSIVE METABOLIC PANEL
BUN: 18 mg/dL (ref 6–23)
CO2: 27 mEq/L (ref 19–32)
Calcium: 9.5 mg/dL (ref 8.4–10.5)
Chloride: 103 mEq/L (ref 96–112)
Creatinine, Ser: 1.1 mg/dL (ref 0.4–1.2)
GFR: 54.25 mL/min — ABNORMAL LOW (ref >60.00)
Total Bilirubin: 0.5 mg/dL (ref 0.3–1.2)

## 2011-06-27 LAB — LIPID PANEL
Cholesterol: 274 mg/dL — ABNORMAL HIGH (ref 0–200)
HDL: 53.3 mg/dL (ref >39.00)
Triglycerides: 251 mg/dL — ABNORMAL HIGH (ref 0.0–149.0)

## 2011-06-28 ENCOUNTER — Other Ambulatory Visit: Payer: Self-pay | Admitting: Family Medicine

## 2011-06-28 DIAGNOSIS — E7800 Pure hypercholesterolemia, unspecified: Secondary | ICD-10-CM

## 2011-06-28 MED ORDER — ATORVASTATIN CALCIUM 40 MG PO TABS: 40.0000 mg | ORAL_TABLET | Freq: Every day | ORAL | Status: DC

## 2011-08-10 ENCOUNTER — Other Ambulatory Visit: Payer: Self-pay | Admitting: Family Medicine

## 2011-09-25 ENCOUNTER — Other Ambulatory Visit (INDEPENDENT_AMBULATORY_CARE_PROVIDER_SITE_OTHER): Payer: Managed Care, Other (non HMO)

## 2011-09-25 DIAGNOSIS — E7800 Pure hypercholesterolemia, unspecified: Secondary | ICD-10-CM

## 2011-09-25 LAB — LIPID PANEL
LDL Cholesterol: 72 mg/dL (ref 0–99)
Total CHOL/HDL Ratio: 3
Triglycerides: 166 mg/dL — ABNORMAL HIGH (ref 0.0–149.0)

## 2011-09-25 LAB — COMPREHENSIVE METABOLIC PANEL
ALT: 19 U/L (ref 0–35)
AST: 19 U/L (ref 0–37)
Calcium: 9.2 mg/dL (ref 8.4–10.5)
Chloride: 107 mEq/L (ref 96–112)
Creatinine, Ser: 1.1 mg/dL (ref 0.4–1.2)
Sodium: 142 mEq/L (ref 135–145)
Total Protein: 7.1 g/dL (ref 6.0–8.3)

## 2012-02-05 ENCOUNTER — Other Ambulatory Visit: Payer: Self-pay | Admitting: Family Medicine

## 2012-04-09 ENCOUNTER — Other Ambulatory Visit: Payer: Self-pay | Admitting: Family Medicine

## 2012-04-25 ENCOUNTER — Other Ambulatory Visit: Payer: Self-pay | Admitting: Family Medicine

## 2012-05-23 ENCOUNTER — Other Ambulatory Visit: Payer: Self-pay | Admitting: Family Medicine

## 2012-05-23 NOTE — Telephone Encounter (Signed)
Left message advising patient to call and schedule appt for physical and advise up front to let me know this has been scheduled so medication can be refilled until then

## 2012-05-23 NOTE — Telephone Encounter (Signed)
Overdue for CPX, not seen in over a year. MAke appt and refill until then.

## 2012-06-07 ENCOUNTER — Encounter: Payer: Self-pay | Admitting: Family Medicine

## 2012-06-07 ENCOUNTER — Ambulatory Visit (INDEPENDENT_AMBULATORY_CARE_PROVIDER_SITE_OTHER): Payer: Managed Care, Other (non HMO) | Admitting: Family Medicine

## 2012-06-07 VITALS — BP 110/78 | HR 64 | Temp 97.9°F | Resp 20 | Ht 67.0 in | Wt 201.5 lb

## 2012-06-07 DIAGNOSIS — E7800 Pure hypercholesterolemia, unspecified: Secondary | ICD-10-CM

## 2012-06-07 DIAGNOSIS — E039 Hypothyroidism, unspecified: Secondary | ICD-10-CM

## 2012-06-07 DIAGNOSIS — M109 Gout, unspecified: Secondary | ICD-10-CM

## 2012-06-07 DIAGNOSIS — I10 Essential (primary) hypertension: Secondary | ICD-10-CM

## 2012-06-07 NOTE — Progress Notes (Signed)
  Subjective:    Patient ID: Virginia Wilson, female    DOB: [birth year], 56 y.o.   MRN: [id number]  HPI  56 year old female presents for follow up.  She has not been seen in over a year.   Gout:  Taking allopurinol daily for prevention, no longer on colcrys.  Had surgery on left  foot 05/2011.  No problems since.  Has noted  week of swelling in left lateral ankle. Nontender, no redness.  Hypertension:  Well controlled out of atenolol or the last week.  Using medication without problems or lightheadedness: None Chest pain with exertion:none Edema:once after traveling Short of breath:None Average home BPs:120/70-80 off med. Other issues:  Cholesterol:  On lipitor an fish oil daily, overdue for re-eval.  Hypothyroid: Due for re-eval.   Regular exercsie, healthy diet.    Review of Systems  Constitutional: Negative for fever and fatigue.  HENT: Negative for ear pain.   Eyes: Negative for pain.  Respiratory: Negative for chest tightness and shortness of breath.   Cardiovascular: Negative for chest pain, palpitations and leg swelling.  Gastrointestinal: Negative for abdominal pain.  Genitourinary: Negative for dysuria.       Objective:   Physical Exam  Constitutional: Vital signs are normal. She appears well-developed and well-nourished. She is cooperative.  Non-toxic appearance. She does not appear ill. No distress.  HENT:  Head: Normocephalic.  Right Ear: Hearing, tympanic membrane, external ear and ear canal normal. Tympanic membrane is not erythematous, not retracted and not bulging.  Left Ear: Hearing, tympanic membrane, external ear and ear canal normal. Tympanic membrane is not erythematous, not retracted and not bulging.  Nose: No mucosal edema or rhinorrhea. Right sinus exhibits no maxillary sinus tenderness and no frontal sinus tenderness. Left sinus exhibits no maxillary sinus tenderness and no frontal sinus tenderness.  Mouth/Throat: Uvula is midline, oropharynx is  clear and moist and mucous membranes are normal.  Eyes: Conjunctivae, EOM and lids are normal. Pupils are equal, round, and reactive to light. No foreign bodies found.  Neck: Trachea normal and normal range of motion. Neck supple. Carotid bruit is not present. No mass and no thyromegaly present.  Cardiovascular: Normal rate, regular rhythm, S1 normal, S2 normal, normal heart sounds, intact distal pulses and normal pulses.  Exam reveals no gallop and no friction rub.   No murmur heard. Pulmonary/Chest: Effort normal and breath sounds normal. Not tachypneic. No respiratory distress. She has no decreased breath sounds. She has no wheezes. She has no rhonchi. She has no rales.  Abdominal: Soft. Normal appearance and bowel sounds are normal. There is no tenderness.  Musculoskeletal:       Left ankle: She exhibits swelling. She exhibits normal range of motion, no ecchymosis, no deformity and no laceration. no tenderness. No lateral malleolus and no medial malleolus tenderness found.       Lateral swelling at left lateral malleolus but no pain  Neurological: She is alert.  Skin: Skin is warm, dry and intact. No rash noted.  Psychiatric: Her speech is normal and behavior is normal. Judgment and thought content normal. Her mood appears not anxious. Cognition and memory are normal. She does not exhibit a depressed mood.          Assessment & Plan:

## 2012-06-07 NOTE — Assessment & Plan Note (Signed)
Left ankle swelling does not appear associated with redness and pain so doubt due to gout. We will check uric acid to make sure allopurinol at correct dose.

## 2012-06-07 NOTE — Patient Instructions (Addendum)
Stop atenolol given BP running well off this medication. Follow BP daily at home .Marland Kitchen Goal BP <140/90. Return for fasting labs. Elevate left foot, given swelling more time to resolve.

## 2012-06-07 NOTE — Assessment & Plan Note (Signed)
Due for re-eval. Encouraged exercise, weight loss, healthy eating habits.  

## 2012-06-07 NOTE — Assessment & Plan Note (Signed)
Improved control off atenolol. Stay off this med and follow Bps closely at home.

## 2012-06-07 NOTE — Assessment & Plan Note (Signed)
Due for re-eval. 

## 2012-06-13 ENCOUNTER — Other Ambulatory Visit (INDEPENDENT_AMBULATORY_CARE_PROVIDER_SITE_OTHER): Payer: Managed Care, Other (non HMO)

## 2012-06-13 DIAGNOSIS — E7800 Pure hypercholesterolemia, unspecified: Secondary | ICD-10-CM

## 2012-06-13 DIAGNOSIS — M109 Gout, unspecified: Secondary | ICD-10-CM

## 2012-06-13 DIAGNOSIS — E039 Hypothyroidism, unspecified: Secondary | ICD-10-CM

## 2012-06-13 LAB — CBC WITH DIFFERENTIAL/PLATELET
Eosinophils Relative: 2.6 % (ref 0.0–5.0)
HCT: 40.3 % (ref 36.0–46.0)
Hemoglobin: 13.2 g/dL (ref 12.0–15.0)
Lymphocytes Relative: 23.3 % (ref 12.0–46.0)
Lymphs Abs: 1.7 10*3/uL (ref 0.7–4.0)
Monocytes Relative: 6.4 % (ref 3.0–12.0)
Neutro Abs: 5 10*3/uL (ref 1.4–7.7)
RBC: 4.27 Mil/uL (ref 3.87–5.11)
WBC: 7.5 10*3/uL (ref 4.5–10.5)

## 2012-06-13 LAB — COMPREHENSIVE METABOLIC PANEL
ALT: 28 U/L (ref 0–35)
AST: 23 U/L (ref 0–37)
Albumin: 4 g/dL (ref 3.5–5.2)
BUN: 19 mg/dL (ref 6–23)
Calcium: 9.2 mg/dL (ref 8.4–10.5)
Chloride: 106 mEq/L (ref 96–112)
Potassium: 4.1 mEq/L (ref 3.5–5.1)
Sodium: 140 mEq/L (ref 135–145)
Total Protein: 6.9 g/dL (ref 6.0–8.3)

## 2012-06-13 LAB — TSH: TSH: 6.87 u[IU]/mL — ABNORMAL HIGH (ref 0.35–5.50)

## 2012-06-13 LAB — URIC ACID: Uric Acid, Serum: 4.7 mg/dL (ref 2.4–7.0)

## 2012-06-13 LAB — LIPID PANEL: Total CHOL/HDL Ratio: 3

## 2012-07-02 ENCOUNTER — Ambulatory Visit (INDEPENDENT_AMBULATORY_CARE_PROVIDER_SITE_OTHER): Payer: Managed Care, Other (non HMO) | Admitting: Family Medicine

## 2012-07-02 ENCOUNTER — Encounter: Payer: Self-pay | Admitting: Family Medicine

## 2012-07-02 VITALS — BP 101/77 | HR 67 | Temp 98.2°F | Ht 67.0 in | Wt 201.0 lb

## 2012-07-02 DIAGNOSIS — E7800 Pure hypercholesterolemia, unspecified: Secondary | ICD-10-CM

## 2012-07-02 DIAGNOSIS — E039 Hypothyroidism, unspecified: Secondary | ICD-10-CM

## 2012-07-02 DIAGNOSIS — I10 Essential (primary) hypertension: Secondary | ICD-10-CM

## 2012-07-02 DIAGNOSIS — M109 Gout, unspecified: Secondary | ICD-10-CM

## 2012-07-02 DIAGNOSIS — Z1231 Encounter for screening mammogram for malignant neoplasm of breast: Secondary | ICD-10-CM

## 2012-07-02 DIAGNOSIS — Z1212 Encounter for screening for malignant neoplasm of rectum: Secondary | ICD-10-CM

## 2012-07-02 MED ORDER — ATORVASTATIN CALCIUM 40 MG PO TABS: 40.0000 mg | ORAL_TABLET | Freq: Every day | ORAL | Status: DC

## 2012-07-02 MED ORDER — ALLOPURINOL 300 MG PO TABS: 300.0000 mg | ORAL_TABLET | Freq: Every day | ORAL | Status: DC

## 2012-07-02 NOTE — Assessment & Plan Note (Signed)
Well controlled. Continue current medication.  

## 2012-07-02 NOTE — Assessment & Plan Note (Signed)
Pt has never been on med.. Not sure if this is correct diagnosis. Will check t3 and t4 since tsh up.

## 2012-07-02 NOTE — Progress Notes (Signed)
Subjective:    Patient ID: Virginia Wilson, female    DOB: December 21, 1955, 56 y.o.   MRN: [id number]  HPI The patient is here for annual wellness exam and preventative care.     Hypothyroid:  Slightly above goal on no medication. Lab Results  Component Value Date   TSH 6.87* 06/13/2012   Hypertension:  Well controlled on no medicaiton.  Using medication without problems or lightheadedness: None Chest pain with exertion:None Edema:None Short of breath:None Average home BPs:123/80s Other issues:  Elevated Cholesterol: at goal on lipitor 40 mg daily Lab Results  Component Value Date   CHOL 141 06/13/2012   HDL 54.00 06/13/2012   LDLCALC 47 06/13/2012   LDLDIRECT 128.4 06/27/2011   TRIG 199.0* 06/13/2012   CHOLHDL 3 06/13/2012    Using medications without problems:No Muscle aches: None Diet compliance:Good Exercise:walking twice a week Other complaints:  Gout: on allopurinol. Uric acid 4.7    Review of Systems  Constitutional: Negative for fever, fatigue and unexpected weight change.  HENT: Negative for ear pain, congestion, sore throat, sneezing, trouble swallowing and sinus pressure.   Eyes: Negative for pain and itching.  Respiratory: Negative for cough, shortness of breath and wheezing.   Cardiovascular: Negative for chest pain, palpitations and leg swelling.  Gastrointestinal: Negative for nausea, abdominal pain, diarrhea, constipation and blood in stool.  Genitourinary: Negative for dysuria, hematuria, vaginal discharge, difficulty urinating and menstrual problem.  Skin: Negative for rash.  Neurological: Negative for syncope, weakness, light-headedness, numbness and headaches.  Psychiatric/Behavioral: Negative for confusion and dysphoric mood. The patient is not nervous/anxious.    Hemorrhoids.    Objective:   Physical Exam  Constitutional: Vital signs are normal. She appears well-developed and well-nourished. She is cooperative.  Non-toxic appearance. She does not appear ill. No  distress.  HENT:  Head: Normocephalic.  Right Ear: Hearing, tympanic membrane, external ear and ear canal normal.  Left Ear: Hearing, tympanic membrane, external ear and ear canal normal.  Nose: Nose normal.  Eyes: Conjunctivae normal, EOM and lids are normal. Pupils are equal, round, and reactive to light. No foreign bodies found.  Neck: Trachea normal and normal range of motion. Neck supple. Carotid bruit is not present. No mass and no thyromegaly present.  Cardiovascular: Normal rate, regular rhythm, S1 normal, S2 normal, normal heart sounds and intact distal pulses.  Exam reveals no gallop.   No murmur heard. Pulmonary/Chest: Effort normal and breath sounds normal. No respiratory distress. She has no wheezes. She has no rhonchi. She has no rales.  Abdominal: Soft. Normal appearance and bowel sounds are normal. She exhibits no distension, no fluid wave, no abdominal bruit and no mass. There is no hepatosplenomegaly. There is no tenderness. There is no rebound, no guarding and no CVA tenderness. No hernia.  Genitourinary: Vagina normal and uterus normal. No breast swelling, tenderness, discharge or bleeding. Pelvic exam was performed with patient supine. There is no rash, tenderness or lesion on the right labia. There is no rash, tenderness or lesion on the left labia. Uterus is not enlarged and not tender. Right adnexum displays no mass, no tenderness and no fullness. Left adnexum displays no mass, no tenderness and no fullness.  Lymphadenopathy:    She has no cervical adenopathy.    She has no axillary adenopathy.  Neurological: She is alert. She has normal strength. No cranial nerve deficit or sensory deficit.  Skin: Skin is warm, dry and intact. No rash noted.  Psychiatric: Her speech is normal and behavior is  normal. Judgment normal. Her mood appears not anxious. Cognition and memory are normal. She does not exhibit a depressed mood.          Assessment & Plan:  The patient's  preventative maintenance and recommended screening tests for an annual wellness exam were reviewed in full today. Brought up to date unless services declined.  Counselled on the importance of diet, exercise, and its role in overall health and mortality. The patient's FH and SH was reviewed, including their home life, tobacco status, and drug and alcohol status.   Vaccines:uptodate with TD, refuses flu  Mammo:last nml 2012 PAP/DVE: last pap 2012, on q 2 year schedule, yearly DVE. ifob 2012  Nonsmoker

## 2012-07-02 NOTE — Assessment & Plan Note (Signed)
Stable on allopurinol. Uric acid 4.7

## 2012-07-02 NOTE — Assessment & Plan Note (Signed)
Well controlled on no medication  

## 2012-07-02 NOTE — Patient Instructions (Addendum)
Stop by lab on way out to get T3 and t4 drawn, and to pick up ifob.  Stop at front desk on your way out to schedule mammogram.

## 2012-07-03 LAB — T3, FREE: T3, Free: 2.5 pg/mL (ref 2.3–4.2)

## 2012-07-05 ENCOUNTER — Encounter: Payer: Managed Care, Other (non HMO) | Admitting: Family Medicine

## 2012-08-06 ENCOUNTER — Ambulatory Visit: Payer: Self-pay | Admitting: Family Medicine

## 2012-08-08 ENCOUNTER — Encounter: Payer: Self-pay | Admitting: Family Medicine

## 2013-04-08 LAB — LIPID PANEL
Cholesterol: 141 mg/dL (ref 0–200)
LDL Cholesterol: 67 mg/dL
Triglycerides: 154 mg/dL (ref 40–160)

## 2013-04-08 LAB — BASIC METABOLIC PANEL: Glucose: 87 mg/dL

## 2013-04-16 ENCOUNTER — Encounter: Payer: Self-pay | Admitting: Family Medicine

## 2013-07-03 ENCOUNTER — Other Ambulatory Visit: Payer: Self-pay | Admitting: *Deleted

## 2013-07-03 MED ORDER — ATORVASTATIN CALCIUM 40 MG PO TABS: 40.0000 mg | ORAL_TABLET | Freq: Every day | ORAL | Status: DC

## 2013-07-03 MED ORDER — ALLOPURINOL 300 MG PO TABS: 300.0000 mg | ORAL_TABLET | Freq: Every day | ORAL | Status: DC

## 2013-10-22 ENCOUNTER — Encounter: Payer: Self-pay | Admitting: Family Medicine

## 2013-10-22 ENCOUNTER — Ambulatory Visit (INDEPENDENT_AMBULATORY_CARE_PROVIDER_SITE_OTHER): Payer: Managed Care, Other (non HMO) | Admitting: Family Medicine

## 2013-10-22 VITALS — BP 120/90 | HR 80 | Temp 98.8°F | Ht 67.0 in | Wt 185.5 lb

## 2013-10-22 DIAGNOSIS — H109 Unspecified conjunctivitis: Secondary | ICD-10-CM

## 2013-10-22 DIAGNOSIS — H1089 Other conjunctivitis: Secondary | ICD-10-CM

## 2013-10-22 DIAGNOSIS — H60399 Other infective otitis externa, unspecified ear: Secondary | ICD-10-CM

## 2013-10-22 DIAGNOSIS — A499 Bacterial infection, unspecified: Secondary | ICD-10-CM

## 2013-10-22 DIAGNOSIS — B9689 Other specified bacterial agents as the cause of diseases classified elsewhere: Secondary | ICD-10-CM

## 2013-10-22 MED ORDER — CIPROFLOXACIN-DEXAMETHASONE 0.3-0.1 % OT SUSP: 4.0000 [drp] | Freq: Two times a day (BID) | OTIC | Status: DC

## 2013-10-22 MED ORDER — MOXIFLOXACIN HCL 0.5 % OP SOLN: 1.0000 [drp] | Freq: Three times a day (TID) | OPHTHALMIC | Status: DC

## 2013-10-22 NOTE — Progress Notes (Signed)
Pre-visit discussion using our clinic review tool. No additional management support is needed unless otherwise documented below in the visit note.  

## 2013-10-22 NOTE — Progress Notes (Signed)
Date:  10/22/2013   Name:  Virginia Wilson   DOB:  1956/05/25   MRN:  [id number] Gender: female Age: 58 y.o.  Primary Physician:  Eliezer Lofts, MD   Chief Complaint: Conjunctivitis and Otitis Media   Subjective:   History of Present Illness:  Virginia Wilson is a 58 y.o. very pleasant female patient who presents with the following:  Pleasant lady who over the last 4 or 5 days has had worsening of some significant eye redness and pain. 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 She has no known exposure to conjunctivitis.  She also has some severe pain in her bilateral ear canals without fever. She felt a little bump on the right 1, but then she began to have some diffuse pain in each canal.  Past Medical History, Surgical History, Social History, Family History, Problem List, Medications, and Allergies have been reviewed and updated if relevant.  Review of Systems: ROS: GEN: Acute illness details above GI: Tolerating PO intake GU: maintaining adequate hydration and urination Pulm: No SOB Interactive and getting along well at home.  Otherwise, ROS is as per the HPI.   Objective:   Physical Examination: BP 120/90  Pulse 80  Temp(Src) 98.8 F (37.1 C) (Oral)  Ht 5\' 7"  (1.702 m)  Wt 185 lb 8 oz (84.142 kg)  BMI 29.05 kg/m2   Gen: WDWN, NAD; A & O x3, cooperative. Pleasant.Globally Non-toxic HEENT: Normocephalic and atraumatic. CONJUNCTIVA ARE VERY PINK/RED, MOST MEDIALLY. PERRLA AND EOMI. Throat clear, w/o exudate, R TM clear BUT CANAL IS VERY SWOLLEN AND TENDER, L TM - good landmarks, No fluid present, BUT CANAL IS VERY SWOLLEN AND MOST OF EAR IS TENDER.  rhinnorhea.  MMM Frontal sinuses: NT Max sinuses: NT NECK: Anterior cervical  LAD is absent CV: RRR, No M/G/R, cap refill <2 sec PULM: Breathing comfortably in no respiratory distress. no wheezing, crackles,  rhonchi EXT: No c/c/e PSYCH: Friendly, good eye contact MSK: Nml gait     Laboratory and Imaging Data:  Assessment & Plan:    Otitis, externa, infective  Conjunctivitis, bacterial  Severe appearing B conjunctivitis and B OE, very tender. Do not think OM.  OOW until Friday.  Knows to call if worsen  New medications, updates to list, dose adjustments: Meds ordered this encounter  Medications  . Omega-3 Fatty Acids (FISH OIL) 1200 MG CAPS    Sig: Take 1 capsule by mouth daily.  . ciprofloxacin-dexamethasone (CIPRODEX) otic suspension    Sig: Place 4 drops into both ears 2 (two) times daily.    Dispense:  7.5 mL    Refill:  0  . moxifloxacin (VIGAMOX) 0.5 % ophthalmic solution    Sig: Place 1 drop into both eyes 3 (three) times daily.    Dispense:  3 mL    Refill:  0    Signed,  Shavaughn Seidl T. Aima Mcwhirt, MD, [place] [facility] [place] [address] Phone: [phone] Fax: [phone]    Medication List       This list is accurate as of: 10/22/13  2:48 PM.  Always use your most recent med list.               allopurinol 300 MG tablet  Commonly known as:  ZYLOPRIM  Take 1 tablet (300 mg total) by  mouth daily.     atorvastatin 40 MG tablet  Commonly known as:  LIPITOR  Take 1 tablet (40 mg total) by mouth daily.     ciprofloxacin-dexamethasone otic suspension  Commonly known as:  CIPRODEX  Place 4 drops into both ears 2 (two) times daily.     Fish Oil 1200 MG Caps  Take 1 capsule by mouth daily.     moxifloxacin 0.5 % ophthalmic solution  Commonly known as:  VIGAMOX  Place 1 drop into both eyes 3 (three) times daily.     omeprazole 20 MG capsule  Commonly known as:  PRILOSEC  Take 20 mg by mouth daily.

## 2013-12-02 ENCOUNTER — Ambulatory Visit: Payer: Managed Care, Other (non HMO) | Admitting: Family Medicine

## 2013-12-09 ENCOUNTER — Ambulatory Visit (INDEPENDENT_AMBULATORY_CARE_PROVIDER_SITE_OTHER): Payer: Managed Care, Other (non HMO) | Admitting: Family Medicine

## 2013-12-09 VITALS — BP 130/90 | HR 57 | Temp 98.0°F | Ht 67.0 in | Wt 182.5 lb

## 2013-12-09 DIAGNOSIS — E039 Hypothyroidism, unspecified: Secondary | ICD-10-CM

## 2013-12-09 DIAGNOSIS — E7800 Pure hypercholesterolemia, unspecified: Secondary | ICD-10-CM

## 2013-12-09 DIAGNOSIS — I10 Essential (primary) hypertension: Secondary | ICD-10-CM

## 2013-12-09 DIAGNOSIS — M109 Gout, unspecified: Secondary | ICD-10-CM

## 2013-12-09 MED ORDER — ALLOPURINOL 300 MG PO TABS: 300.0000 mg | ORAL_TABLET | Freq: Every day | ORAL | Status: DC

## 2013-12-09 MED ORDER — ATORVASTATIN CALCIUM 40 MG PO TABS: 40.0000 mg | ORAL_TABLET | Freq: Every day | ORAL | Status: DC

## 2013-12-09 NOTE — Assessment & Plan Note (Signed)
Due for re-eval. 

## 2013-12-09 NOTE — Assessment & Plan Note (Signed)
Due for re-eval. Tolerating statin.

## 2013-12-09 NOTE — Assessment & Plan Note (Signed)
Well controlled, due for uric acid re-eval

## 2013-12-09 NOTE — Assessment & Plan Note (Signed)
Well controlled on no medication  

## 2013-12-09 NOTE — Progress Notes (Signed)
   58 year old female presents for med refill. Hypothyroid: Due for re-eval.  Lab Results  Component Value Date   TSH 6.87* 06/13/2012    Hypertension: Borderline control on no medicaiton.  BP Readings from Last 3 Encounters:  12/09/13 130/90  10/22/13 120/90  07/02/12 101/77  Using medication without problems or lightheadedness: None  Chest pain with exertion:None  Edema:None  Short of breath:None  Average home BPs:120/80s Other issues:   Elevated Cholesterol:  Due for re-eval on lipitor 40 mg daily  Lab Results  Component Value Date   CHOL 141 04/08/2013   HDL 44 04/08/2013   LDLCALC 67 04/08/2013   LDLDIRECT 128.4 06/27/2011   TRIG 154 04/08/2013   CHOLHDL 3 06/13/2012  Using medications without problems:No  Muscle aches: None  Diet compliance:Good  Exercise:walking twice a week  Other complaints:  Wt Readings from Last 3 Encounters:  12/09/13 182 lb 8 oz (82.781 kg)  10/22/13 185 lb 8 oz (84.142 kg)  07/02/12 201 lb (91.173 kg)    Gout: on allopurinol.due for-re-eval.  No flare in the last year.  Review of Systems  Constitutional: Negative for fever, fatigue and unexpected weight change.  HENT: Negative for ear pain, congestion, sore throat, sneezing, trouble swallowing and sinus pressure.  Eyes: Negative for pain and itching.  Respiratory: Negative for cough, shortness of breath and wheezing.  Cardiovascular: Negative for chest pain, palpitations and leg swelling.  Gastrointestinal: Negative for nausea, abdominal pain, diarrhea, constipation and blood in stool.  Genitourinary: Negative for dysuria, hematuria, vaginal discharge, difficulty urinating and menstrual problem.  Skin: Negative for rash.  Neurological: Negative for syncope, weakness, light-headedness, numbness and headaches.  Psychiatric/Behavioral: Negative for confusion and dysphoric mood. The patient is not nervous/anxious.  Hemorrhoids.  Objective:   Physical Exam  Constitutional: Vital signs are normal.  She appears well-developed and well-nourished. She is cooperative. Non-toxic appearance. She does not appear ill. No distress.  HENT:  Head: Normocephalic.  Right Ear: Hearing, tympanic membrane, external ear and ear canal normal.  Left Ear: Hearing, tympanic membrane, external ear and ear canal normal.  Nose: Nose normal.  Eyes: Conjunctivae normal, EOM and lids are normal. Pupils are equal, round, and reactive to light. No foreign bodies found.  Neck: Trachea normal and normal range of motion. Neck supple. Carotid bruit is not present. No mass and no thyromegaly present.  Cardiovascular: Normal rate, regular rhythm, S1 normal, S2 normal, normal heart sounds and intact distal pulses. Exam reveals no gallop.  No murmur heard.  Pulmonary/Chest: Effort normal and breath sounds normal. No respiratory distress. She has no wheezes. She has no rhonchi. She has no rales.  Abdominal: Soft. Normal appearance and bowel sounds are normal. She exhibits no distension, no fluid wave, no abdominal bruit and no mass. There is no hepatosplenomegaly. There is no tenderness. There is no rebound, no guarding and no CVA tenderness. No hernia.  Lymphadenopathy:  She has no cervical adenopathy.  She has no axillary adenopathy.  Neurological: She is alert. She has normal strength. No cranial nerve deficit or sensory deficit.  Skin: Skin is warm, dry and intact. No rash noted.  Psychiatric: Her speech is normal and behavior is normal. Judgment normal. Her mood appears not anxious. Cognition and memory are normal. She does not exhibit a depressed mood.

## 2013-12-09 NOTE — Patient Instructions (Addendum)
Schedule CPX, no labs prior. Stop at lab on way out. Work on [employer] and regular exercise.

## 2013-12-09 NOTE — Progress Notes (Signed)
Pre visit review using our clinic review tool, if applicable. No additional management support is needed unless otherwise documented below in the visit note. 

## 2013-12-10 ENCOUNTER — Telehealth: Payer: Self-pay | Admitting: Family Medicine

## 2013-12-10 LAB — LIPID PANEL
CHOL/HDL RATIO: 3
CHOLESTEROL: 140 mg/dL (ref 0–200)
HDL: 53.8 mg/dL (ref >39.00)
LDL CALC: 65 mg/dL (ref 0–99)
TRIGLYCERIDES: 108 mg/dL (ref 0.0–149.0)
VLDL: 21.6 mg/dL (ref 0.0–40.0)

## 2013-12-10 LAB — COMPREHENSIVE METABOLIC PANEL
ALK PHOS: 72 U/L (ref 39–117)
ALT: 18 U/L (ref 0–35)
AST: 21 U/L (ref 0–37)
Albumin: 4.2 g/dL (ref 3.5–5.2)
BUN: 16 mg/dL (ref 6–23)
CO2: 28 mEq/L (ref 19–32)
CREATININE: 1.2 mg/dL (ref 0.4–1.2)
Calcium: 9.4 mg/dL (ref 8.4–10.5)
Chloride: 104 mEq/L (ref 96–112)
GFR: 48.68 mL/min — ABNORMAL LOW (ref >60.00)
Glucose, Bld: 68 mg/dL — ABNORMAL LOW (ref 70–99)
Potassium: 4.1 mEq/L (ref 3.5–5.1)
Sodium: 139 mEq/L (ref 135–145)
Total Bilirubin: 1 mg/dL (ref 0.3–1.2)
Total Protein: 7.2 g/dL (ref 6.0–8.3)

## 2013-12-10 LAB — TSH: TSH: 2.23 u[IU]/mL (ref 0.35–5.50)

## 2013-12-10 LAB — URIC ACID: Uric Acid, Serum: 4.8 mg/dL (ref 2.4–7.0)

## 2013-12-10 NOTE — Telephone Encounter (Signed)
Relevant patient education assigned to patient using Emmi. ° °

## 2013-12-12 ENCOUNTER — Encounter: Payer: Self-pay | Admitting: *Deleted

## 2014-02-06 ENCOUNTER — Ambulatory Visit (INDEPENDENT_AMBULATORY_CARE_PROVIDER_SITE_OTHER): Payer: Managed Care, Other (non HMO) | Admitting: Family Medicine

## 2014-02-06 ENCOUNTER — Other Ambulatory Visit (HOSPITAL_COMMUNITY)
Admission: RE | Admit: 2014-02-06 | Discharge: 2014-02-06 | Disposition: A | Discharge status: Home / Self Care | Payer: Managed Care, Other (non HMO) | Source: Ambulatory Visit | Attending: Family Medicine | Admitting: Family Medicine

## 2014-02-06 ENCOUNTER — Encounter: Payer: Self-pay | Admitting: Family Medicine

## 2014-02-06 VITALS — BP 151/94 | HR 94 | Temp 97.9°F | Ht 66.5 in | Wt 182.2 lb

## 2014-02-06 DIAGNOSIS — N183 Chronic kidney disease, stage 3 unspecified: Secondary | ICD-10-CM | POA: Insufficient documentation

## 2014-02-06 DIAGNOSIS — Z1211 Encounter for screening for malignant neoplasm of colon: Secondary | ICD-10-CM

## 2014-02-06 DIAGNOSIS — M109 Gout, unspecified: Secondary | ICD-10-CM

## 2014-02-06 DIAGNOSIS — E039 Hypothyroidism, unspecified: Secondary | ICD-10-CM

## 2014-02-06 DIAGNOSIS — Z1151 Encounter for screening for human papillomavirus (HPV): Secondary | ICD-10-CM | POA: Insufficient documentation

## 2014-02-06 DIAGNOSIS — Z124 Encounter for screening for malignant neoplasm of cervix: Secondary | ICD-10-CM

## 2014-02-06 DIAGNOSIS — E7800 Pure hypercholesterolemia, unspecified: Secondary | ICD-10-CM

## 2014-02-06 DIAGNOSIS — Z0000 Encounter for general adult medical examination without abnormal findings: Secondary | ICD-10-CM

## 2014-02-06 DIAGNOSIS — Z01419 Encounter for gynecological examination (general) (routine) without abnormal findings: Secondary | ICD-10-CM | POA: Insufficient documentation

## 2014-02-06 DIAGNOSIS — I10 Essential (primary) hypertension: Secondary | ICD-10-CM

## 2014-02-06 DIAGNOSIS — N181 Chronic kidney disease, stage 1: Secondary | ICD-10-CM

## 2014-02-06 MED ORDER — FLUTICASONE PROPIONATE 50 MCG/ACT NA SUSP: 2.0000 | Freq: Every day | NASAL | Status: DC

## 2014-02-06 NOTE — Assessment & Plan Note (Signed)
No family history of renal issues.  Follow BP to make sure not running high. No DM.

## 2014-02-06 NOTE — Assessment & Plan Note (Signed)
Elevated today, pt state white coat HTN element... Follow at home.. likely will need to start medication to treat. Need good control given renal insufficiency.

## 2014-02-06 NOTE — Assessment & Plan Note (Signed)
Well controlled on no medication  

## 2014-02-06 NOTE — Assessment & Plan Note (Signed)
Well controlled. Continue current medication.  

## 2014-02-06 NOTE — Addendum Note (Signed)
Addended by: Carter Kitten on: 02/06/2014 03:19 PM   Modules accepted: Orders

## 2014-02-06 NOTE — Progress Notes (Signed)
The patient is here for annual wellness exam and preventative care.   Allergic rhinitis: Claritin not helping much. A lot nasal congestion, sneezing, minimal eye symptoms.  Hypothyroid: Stable control on no medication. Lab Results  Component Value Date   TSH 2.23 12/09/2013    Hypertension: Poor control today on no medicaiton.  BP Readings from Last 3 Encounters:  02/06/14 151/94  12/09/13 130/90  10/22/13 120/90  Using medication without problems or lightheadedness: None  Chest pain with exertion:None  Edema:None  Short of breath:None  Average home BPs:120/75  Wt Readings from Last 3 Encounters:  02/06/14 182 lb 4 oz (82.668 kg)  12/09/13 182 lb 8 oz (82.781 kg)  10/22/13 185 lb 8 oz (84.142 kg)   Other issues:   Elevated Cholesterol: at goal on lipitor 40 mg daily  Lab Results  Component Value Date   CHOL 140 12/09/2013   HDL 53.80 12/09/2013   LDLCALC 65 12/09/2013   LDLDIRECT 128.4 06/27/2011   TRIG 108.0 12/09/2013   CHOLHDL 3 12/09/2013  Using medications without problems:No  Muscle aches: None  Diet compliance:Moderate control. Exercise:walking twice a week  Other complaints:   Gout: On allopurinol. No flares in over 1 year. Uric acid 4.8  Review of Systems  Constitutional: Negative for fever, fatigue and unexpected weight change.  HENT: Negative for ear pain, congestion, sore throat, sneezing, trouble swallowing and sinus pressure.  Eyes: Negative for pain and itching.  Respiratory: Negative for cough, shortness of breath and wheezing.  Cardiovascular: Negative for chest pain, palpitations and leg swelling.  Gastrointestinal: Negative for nausea, abdominal pain, diarrhea, constipation and blood in stool.  Genitourinary: Negative for dysuria, hematuria, vaginal discharge, difficulty urinating and menstrual problem.  Skin: Negative for rash.  Neurological: Negative for syncope, weakness, light-headedness, numbness and headaches.  Psychiatric/Behavioral: Negative for  confusion and dysphoric mood. The patient is not nervous/anxious.  Hemorrhoids.  Objective:   Physical Exam  Constitutional: Vital signs are normal. She appears well-developed and well-nourished. She is cooperative. Non-toxic appearance. She does not appear ill. No distress.  HENT:  Head: Normocephalic.  Right Ear: Hearing, tympanic membrane, external ear and ear canal normal.  Left Ear: Hearing, tympanic membrane, external ear and ear canal normal.  Nose: Nose normal.  Eyes: Conjunctivae normal, EOM and lids are normal. Pupils are equal, round, and reactive to light. No foreign bodies found.  Neck: Trachea normal and normal range of motion. Neck supple. Carotid bruit is not present. No mass and no thyromegaly present.  Cardiovascular: Normal rate, regular rhythm, S1 normal, S2 normal, normal heart sounds and intact distal pulses. Exam reveals no gallop.  No murmur heard.  Pulmonary/Chest: Effort normal and breath sounds normal. No respiratory distress. She has no wheezes. She has no rhonchi. She has no rales.  Abdominal: Soft. Normal appearance and bowel sounds are normal. She exhibits no distension, no fluid wave, no abdominal bruit and no mass. There is no hepatosplenomegaly. There is no tenderness. There is no rebound, no guarding and no CVA tenderness. No hernia.  Genitourinary: Vagina normal and uterus normal. No breast swelling, tenderness, discharge or bleeding. Pelvic exam was performed with patient supine. There is no rash, tenderness or lesion on the right labia. There is no rash, tenderness or lesion on the left labia. Uterus is not enlarged and not tender. Right adnexum displays no mass, no tenderness and no fullness. Left adnexum displays no mass, no tenderness and no fullness.  Lymphadenopathy:  She has no cervical adenopathy.  She  has no axillary adenopathy.  Neurological: She is alert. She has normal strength. No cranial nerve deficit or sensory deficit.  Skin: Skin is warm, dry  and intact. No rash noted.  Psychiatric: Her speech is normal and behavior is normal. Judgment normal. Her mood appears not anxious. Cognition and memory are normal. She does not exhibit a depressed mood.  Assessment & Plan:   The patient's preventative maintenance and recommended screening tests for an annual wellness exam were reviewed in full today.  Brought up to date unless services declined.  Counselled on the importance of diet, exercise, and its role in overall health and mortality.  The patient's FH and SH was reviewed, including their home life, tobacco status, and drug and alcohol status.   Vaccines:uptodate with TD. Mammo:last nml 2013  PAP/DVE: last pap 2012, on q 3 year schedule, due this year, yearly DVE.  ifob 2012  Nonsmoker DEXA: No family history of osteoporosis, low risk, start age 50. Colon: yearly ifob

## 2014-02-06 NOTE — Progress Notes (Signed)
Pre visit review using our clinic review tool, if applicable. No additional management support is needed unless otherwise documented below in the visit note. 

## 2014-02-06 NOTE — Patient Instructions (Addendum)
Change to zyrtec at bedtime. Add nasal steroid spray 2 sprays per nostril daily. Check BP more frequently 1-2 weeks at different times of the day. Call if have 3 measurements >  or equal to 140/90.  Call to schedule mammogram on your own.

## 2014-02-06 NOTE — Assessment & Plan Note (Signed)
Stable on allopurinol

## 2014-02-12 ENCOUNTER — Encounter: Payer: Self-pay | Admitting: *Deleted

## 2014-02-13 ENCOUNTER — Other Ambulatory Visit (INDEPENDENT_AMBULATORY_CARE_PROVIDER_SITE_OTHER): Payer: Managed Care, Other (non HMO)

## 2014-02-13 ENCOUNTER — Other Ambulatory Visit: Payer: Managed Care, Other (non HMO)

## 2014-02-13 DIAGNOSIS — Z1211 Encounter for screening for malignant neoplasm of colon: Secondary | ICD-10-CM

## 2014-02-13 LAB — FECAL OCCULT BLOOD, IMMUNOCHEMICAL: Fecal Occult Bld: NEGATIVE

## 2014-02-25 ENCOUNTER — Other Ambulatory Visit: Payer: Self-pay | Admitting: *Deleted

## 2014-02-25 MED ORDER — ATORVASTATIN CALCIUM 40 MG PO TABS: 40.0000 mg | ORAL_TABLET | Freq: Every day | ORAL | Status: DC

## 2014-02-27 MED ORDER — ATORVASTATIN CALCIUM 40 MG PO TABS: 40.0000 mg | ORAL_TABLET | Freq: Every day | ORAL | Status: DC

## 2014-02-27 NOTE — Addendum Note (Signed)
Addended by: Carter Kitten on: 02/27/2014 04:48 PM   Modules accepted: Orders

## 2014-03-03 ENCOUNTER — Other Ambulatory Visit: Payer: Self-pay

## 2014-03-03 NOTE — Telephone Encounter (Signed)
[facility] left v/m request a refill allopurinol 300 mg. Ref # [id number].

## 2014-03-04 MED ORDER — ALLOPURINOL 300 MG PO TABS: 300.0000 mg | ORAL_TABLET | Freq: Every day | ORAL | Status: DC

## 2014-03-04 NOTE — Telephone Encounter (Signed)
[facility] left v/m requesting refill allopurinol 300 mg electonically to [facility] [id number] [place].Please advise.

## 2014-08-14 ENCOUNTER — Other Ambulatory Visit: Payer: Self-pay | Admitting: Family Medicine

## 2014-08-19 ENCOUNTER — Other Ambulatory Visit: Payer: Self-pay | Admitting: Family Medicine

## 2014-11-10 ENCOUNTER — Other Ambulatory Visit: Payer: Self-pay | Admitting: *Deleted

## 2014-11-10 MED ORDER — ATORVASTATIN CALCIUM 40 MG PO TABS: 40.0000 mg | ORAL_TABLET | Freq: Every day | ORAL | Status: DC

## 2014-12-17 ENCOUNTER — Ambulatory Visit: Payer: Self-pay | Admitting: Family Medicine

## 2014-12-18 ENCOUNTER — Encounter: Payer: Self-pay | Admitting: Family Medicine

## 2014-12-22 ENCOUNTER — Ambulatory Visit: Payer: Self-pay | Admitting: Family Medicine

## 2014-12-23 ENCOUNTER — Encounter: Payer: Self-pay | Admitting: Family Medicine

## 2015-02-10 ENCOUNTER — Telehealth: Payer: Self-pay | Admitting: Family Medicine

## 2015-02-10 ENCOUNTER — Other Ambulatory Visit (INDEPENDENT_AMBULATORY_CARE_PROVIDER_SITE_OTHER): Payer: Managed Care, Other (non HMO)

## 2015-02-10 DIAGNOSIS — E7800 Pure hypercholesterolemia, unspecified: Secondary | ICD-10-CM

## 2015-02-10 DIAGNOSIS — E038 Other specified hypothyroidism: Secondary | ICD-10-CM

## 2015-02-10 DIAGNOSIS — M109 Gout, unspecified: Secondary | ICD-10-CM

## 2015-02-10 LAB — COMPREHENSIVE METABOLIC PANEL
ALBUMIN: 4.2 g/dL (ref 3.5–5.2)
ALT: 23 U/L (ref 0–35)
AST: 29 U/L (ref 0–37)
Alkaline Phosphatase: 74 U/L (ref 39–117)
BUN: 17 mg/dL (ref 6–23)
CHLORIDE: 106 meq/L (ref 96–112)
CO2: 29 mEq/L (ref 19–32)
Calcium: 9.3 mg/dL (ref 8.4–10.5)
Creatinine, Ser: 1.17 mg/dL (ref 0.40–1.20)
GFR: 50.39 mL/min — ABNORMAL LOW (ref >60.00)
Glucose, Bld: 107 mg/dL — ABNORMAL HIGH (ref 70–99)
Potassium: 4.1 mEq/L (ref 3.5–5.1)
SODIUM: 140 meq/L (ref 135–145)
TOTAL PROTEIN: 6.9 g/dL (ref 6.0–8.3)
Total Bilirubin: 0.5 mg/dL (ref 0.2–1.2)

## 2015-02-10 LAB — LIPID PANEL
CHOL/HDL RATIO: 3
Cholesterol: 136 mg/dL (ref 0–200)
HDL: 48.5 mg/dL (ref >39.00)
LDL CALC: 63 mg/dL (ref 0–99)
NonHDL: 87.5
Triglycerides: 125 mg/dL (ref 0.0–149.0)
VLDL: 25 mg/dL (ref 0.0–40.0)

## 2015-02-10 LAB — TSH: TSH: 6.81 u[IU]/mL — ABNORMAL HIGH (ref 0.35–4.50)

## 2015-02-10 LAB — URIC ACID: Uric Acid, Serum: 3.9 mg/dL (ref 2.4–7.0)

## 2015-02-10 NOTE — Telephone Encounter (Signed)
-----   Message from Marchia Bond sent at 02/05/2015  3:58 PM EDT ----- Regarding: cpx labs on Wed 5/4, need orders please :-) Please order  future cpx labs for pt's upcoming lab appt. Thanks Aniceto Boss

## 2015-02-12 ENCOUNTER — Encounter: Payer: Managed Care, Other (non HMO) | Admitting: Family Medicine

## 2015-02-18 ENCOUNTER — Other Ambulatory Visit: Payer: Self-pay | Admitting: *Deleted

## 2015-02-18 MED ORDER — ALLOPURINOL 300 MG PO TABS: 300.0000 mg | ORAL_TABLET | Freq: Every day | ORAL | Status: DC

## 2015-02-18 MED ORDER — ATORVASTATIN CALCIUM 40 MG PO TABS: 40.0000 mg | ORAL_TABLET | Freq: Every day | ORAL | Status: DC

## 2015-02-23 ENCOUNTER — Ambulatory Visit (INDEPENDENT_AMBULATORY_CARE_PROVIDER_SITE_OTHER): Payer: Managed Care, Other (non HMO) | Admitting: Family Medicine

## 2015-02-23 ENCOUNTER — Encounter: Payer: Self-pay | Admitting: Family Medicine

## 2015-02-23 VITALS — BP 128/88 | HR 64 | Temp 97.7°F | Ht 66.5 in | Wt 200.5 lb

## 2015-02-23 DIAGNOSIS — Z0000 Encounter for general adult medical examination without abnormal findings: Secondary | ICD-10-CM

## 2015-02-23 DIAGNOSIS — I10 Essential (primary) hypertension: Secondary | ICD-10-CM

## 2015-02-23 DIAGNOSIS — M109 Gout, unspecified: Secondary | ICD-10-CM | POA: Diagnosis not present

## 2015-02-23 DIAGNOSIS — E038 Other specified hypothyroidism: Secondary | ICD-10-CM | POA: Diagnosis not present

## 2015-02-23 DIAGNOSIS — R7303 Prediabetes: Secondary | ICD-10-CM

## 2015-02-23 DIAGNOSIS — E7800 Pure hypercholesterolemia, unspecified: Secondary | ICD-10-CM

## 2015-02-23 LAB — T4, FREE: Free T4: 0.59 ng/dL — ABNORMAL LOW (ref 0.60–1.60)

## 2015-02-23 LAB — T3, FREE: T3 FREE: 3.1 pg/mL (ref 2.3–4.2)

## 2015-02-23 MED ORDER — FLUOCINONIDE 0.05 % EX SOLN: 1.0000 "application " | Freq: Every day | CUTANEOUS | Status: DC | PRN

## 2015-02-23 MED ORDER — BETAMETHASONE DIPROPIONATE 0.05 % EX CREA: TOPICAL_CREAM | Freq: Two times a day (BID) | CUTANEOUS | Status: DC

## 2015-02-23 NOTE — Assessment & Plan Note (Signed)
Well controlled. Continue current medication.  

## 2015-02-23 NOTE — Assessment & Plan Note (Signed)
Well controlled. Encouraged exercise, weight loss, healthy eating habits.

## 2015-02-23 NOTE — Addendum Note (Signed)
Addended byEliezer Lofts E on: 02/23/2015 04:26 PM   Modules accepted: Miquel Dunn

## 2015-02-23 NOTE — Progress Notes (Signed)
Subjective:    Patient ID: Virginia Wilson, female    DOB: [birth year], 59 y.o.   MRN: [id number]  HPI   The patient is here for annual wellness exam and preventative care.    Doing well overall.  She lost her brother and husband in last 6 months. She has been very emotional. She feels like she is grieving well.  Prediabetes: New issue with weight gain. Wt Readings from Last 3 Encounters:  02/23/15 200 lb 8 oz (90.946 kg)  02/06/14 182 lb 4 oz (82.668 kg)  12/09/13 182 lb 8 oz (82.781 kg)     Elevated Cholesterol:  Well controlled on 40- mg of lipitor Lab Results  Component Value Date   CHOL 136 02/10/2015   HDL 48.50 02/10/2015   LDLCALC 63 02/10/2015   LDLDIRECT 128.4 06/27/2011   TRIG 125.0 02/10/2015   CHOLHDL 3 02/10/2015  Using medications without problems: Muscle aches:  Diet compliance:Moderate Exercise: none Other complaints:   Hypothyroid:  Inadequate control on no med. Lab Results  Component Value Date   TSH 6.81* 02/10/2015   Hypertension:   Well controlled on no med. BP Readings from Last 3 Encounters:  02/23/15 128/88  02/06/14 151/94  12/09/13 130/90  Using medication without problems or lightheadedness:  None Chest pain with exertion: None Edema:None Short of breath:None Average home BP: 120/70s Other issues:   CRI: improved.  Gout:  Low uric acid on allopurinol. No gout flares in last year.  Need refill of psoriasis scalp med lidex sol. She has use diprolene AP  for flaking in ears , need s refill.     Review of Systems  Constitutional: Negative for fever and fatigue.  HENT: Negative for ear pain.   Eyes: Negative for pain.  Respiratory: Negative for chest tightness and shortness of breath.   Cardiovascular: Negative for chest pain, palpitations and leg swelling.  Gastrointestinal: Negative for abdominal pain.  Genitourinary: Negative for dysuria.       Objective:   Physical Exam  Constitutional: Vital signs are normal. She  appears well-developed and well-nourished. She is cooperative.  Non-toxic appearance. She does not appear ill. No distress.  HENT:  Head: Normocephalic.  Right Ear: Hearing, tympanic membrane, external ear and ear canal normal.  Left Ear: Hearing, tympanic membrane, external ear and ear canal normal.  Nose: Nose normal.  Eyes: Conjunctivae, EOM and lids are normal. Pupils are equal, round, and reactive to light. Lids are everted and swept, no foreign bodies found.  Neck: Trachea normal and normal range of motion. Neck supple. Carotid bruit is not present. No thyroid mass and no thyromegaly present.  Cardiovascular: Normal rate, regular rhythm, S1 normal, S2 normal, normal heart sounds and intact distal pulses.  Exam reveals no gallop.   No murmur heard. Pulmonary/Chest: Effort normal and breath sounds normal. No respiratory distress. She has no wheezes. She has no rhonchi. She has no rales.  Abdominal: Soft. Normal appearance and bowel sounds are normal. She exhibits no distension, no fluid wave, no abdominal bruit and no mass. There is no hepatosplenomegaly. There is no tenderness. There is no rebound, no guarding and no CVA tenderness. No hernia.  Genitourinary: Vagina normal and uterus normal. No breast swelling, tenderness, discharge or bleeding. Pelvic exam was performed with patient supine. There is no rash, tenderness or lesion on the right labia. There is no rash, tenderness or lesion on the left labia. Uterus is not enlarged and not tender. Right adnexum displays no mass, no  tenderness and no fullness. Left adnexum displays no mass, no tenderness and no fullness.  Lymphadenopathy:    She has no cervical adenopathy.    She has no axillary adenopathy.  Neurological: She is alert. She has normal strength. No cranial nerve deficit or sensory deficit.  Skin: Skin is warm, dry and intact. No rash noted.  Psychiatric: Her speech is normal and behavior is normal. Judgment normal. Her mood appears  not anxious. Cognition and memory are normal. She does not exhibit a depressed mood.          Assessment & Plan:  The patient's preventative maintenance and recommended screening tests for an annual wellness exam were reviewed in full today. Brought up to date unless services declined.  Counselled on the importance of diet, exercise, and its role in overall health and mortality. The patient's FH and SH was reviewed, including their home life, tobacco status, and drug and alcohol status.   PAP/DVE: 02/2014, nml, no HPV, repeat in 3 years Mammo: nml 12/2014 Colon:Refuses any type of screen.Never had colonoscopy.  Vaccines: Uptodate.  Hep C: refused at this time.  IRC:VELFYBO.

## 2015-02-23 NOTE — Progress Notes (Signed)
Pre visit review using our clinic review tool, if applicable. No additional management support is needed unless otherwise documented below in the visit note. 

## 2015-02-23 NOTE — Assessment & Plan Note (Signed)
Eval free t3 and t4.

## 2015-02-23 NOTE — Patient Instructions (Addendum)
Get back to exercise as able.  Work on [employer]. Stop at lab on way out.

## 2015-02-25 ENCOUNTER — Telehealth: Payer: Self-pay | Admitting: Family Medicine

## 2015-02-25 MED ORDER — LEVOTHYROXINE SODIUM 25 MCG PO TABS: 25.0000 ug | ORAL_TABLET | Freq: Every day | ORAL | Status: DC

## 2015-02-25 NOTE — Telephone Encounter (Signed)
Lab appointment scheduled for 03/24/2015 at 8:35 am to recheck TSH.

## 2015-02-25 NOTE — Telephone Encounter (Signed)
-----   Message from Carter Kitten, [place] sent at 02/25/2015  9:57 AM EDT ----- Ms. Logiudice notified as instructed by telephone.  She would like to go ahead and start a low dose thyroid medication.  She request Rx be sent to [facility].

## 2015-02-25 NOTE — Telephone Encounter (Signed)
Make sure pt has appt to schedule recheck TSH lab only in 4 weeks.

## 2015-03-15 ENCOUNTER — Other Ambulatory Visit: Payer: Self-pay | Admitting: Family Medicine

## 2015-03-15 DIAGNOSIS — E038 Other specified hypothyroidism: Secondary | ICD-10-CM

## 2015-03-24 ENCOUNTER — Other Ambulatory Visit (INDEPENDENT_AMBULATORY_CARE_PROVIDER_SITE_OTHER): Payer: Managed Care, Other (non HMO)

## 2015-03-24 DIAGNOSIS — E038 Other specified hypothyroidism: Secondary | ICD-10-CM | POA: Diagnosis not present

## 2015-03-24 LAB — T3, FREE: T3, Free: 2.8 pg/mL (ref 2.3–4.2)

## 2015-03-24 LAB — TSH: TSH: 7.41 u[IU]/mL — ABNORMAL HIGH (ref 0.35–4.50)

## 2015-03-24 LAB — T4, FREE: Free T4: 0.69 ng/dL (ref 0.60–1.60)

## 2015-03-25 ENCOUNTER — Encounter: Payer: Self-pay | Admitting: *Deleted

## 2015-04-13 ENCOUNTER — Other Ambulatory Visit: Payer: Self-pay | Admitting: Family Medicine

## 2015-09-30 ENCOUNTER — Other Ambulatory Visit: Payer: Self-pay | Admitting: Family Medicine

## 2016-02-23 ENCOUNTER — Other Ambulatory Visit: Payer: Self-pay | Admitting: Family Medicine

## 2016-03-28 ENCOUNTER — Telehealth: Payer: Self-pay | Admitting: Family Medicine

## 2016-03-28 NOTE — Telephone Encounter (Signed)
Patient called and scheduled follow up appointment with Dr.Bedsole to get her medications refilled on 04/06/16.  Patient wants to know if Dr.Bedsole would like her to have lab work done before the appointment.

## 2016-03-28 NOTE — Telephone Encounter (Signed)
yes

## 2016-03-29 ENCOUNTER — Other Ambulatory Visit: Payer: Self-pay | Admitting: Family Medicine

## 2016-03-29 NOTE — Telephone Encounter (Signed)
Lab appointment scheduled for 04/04/16 at 8:35 am.

## 2016-04-02 ENCOUNTER — Other Ambulatory Visit: Payer: Self-pay | Admitting: Family Medicine

## 2016-04-04 ENCOUNTER — Other Ambulatory Visit (INDEPENDENT_AMBULATORY_CARE_PROVIDER_SITE_OTHER): Payer: Managed Care, Other (non HMO)

## 2016-04-04 ENCOUNTER — Telehealth: Payer: Self-pay | Admitting: Family Medicine

## 2016-04-04 DIAGNOSIS — E038 Other specified hypothyroidism: Secondary | ICD-10-CM | POA: Diagnosis not present

## 2016-04-04 DIAGNOSIS — R7303 Prediabetes: Secondary | ICD-10-CM

## 2016-04-04 DIAGNOSIS — E7800 Pure hypercholesterolemia, unspecified: Secondary | ICD-10-CM

## 2016-04-04 DIAGNOSIS — Z1159 Encounter for screening for other viral diseases: Secondary | ICD-10-CM

## 2016-04-04 LAB — COMPREHENSIVE METABOLIC PANEL
ALK PHOS: 82 U/L (ref 39–117)
ALT: 15 U/L (ref 0–35)
AST: 23 U/L (ref 0–37)
Albumin: 4.2 g/dL (ref 3.5–5.2)
BILIRUBIN TOTAL: 0.4 mg/dL (ref 0.2–1.2)
BUN: 18 mg/dL (ref 6–23)
CO2: 30 mEq/L (ref 19–32)
Calcium: 9.3 mg/dL (ref 8.4–10.5)
Chloride: 107 mEq/L (ref 96–112)
Creatinine, Ser: 1.2 mg/dL (ref 0.40–1.20)
GFR: 48.75 mL/min — ABNORMAL LOW (ref >60.00)
GLUCOSE: 99 mg/dL (ref 70–99)
Potassium: 4.2 mEq/L (ref 3.5–5.1)
SODIUM: 141 meq/L (ref 135–145)
TOTAL PROTEIN: 6.8 g/dL (ref 6.0–8.3)

## 2016-04-04 LAB — T4, FREE: Free T4: 0.7 ng/dL (ref 0.60–1.60)

## 2016-04-04 LAB — LIPID PANEL
CHOL/HDL RATIO: 3
Cholesterol: 123 mg/dL (ref 0–200)
HDL: 46 mg/dL (ref >39.00)
LDL CALC: 54 mg/dL (ref 0–99)
NONHDL: 77.32
TRIGLYCERIDES: 118 mg/dL (ref 0.0–149.0)
VLDL: 23.6 mg/dL (ref 0.0–40.0)

## 2016-04-04 LAB — T3, FREE: T3 FREE: 2.5 pg/mL (ref 2.3–4.2)

## 2016-04-04 LAB — TSH: TSH: 4.32 u[IU]/mL (ref 0.35–4.50)

## 2016-04-04 LAB — HEMOGLOBIN A1C: Hgb A1c MFr Bld: 5.9 % (ref 4.6–6.5)

## 2016-04-04 NOTE — Telephone Encounter (Signed)
-----   Message from Marchia Bond sent at 04/03/2016  4:58 PM EDT ----- Regarding: f/u labs tomorrow, need orders. Thanks! :-) Please order  future f/u labs for pt's upcoming lab appt. Thanks Aniceto Boss

## 2016-04-05 LAB — HEPATITIS C ANTIBODY: HCV Ab: NEGATIVE

## 2016-04-06 ENCOUNTER — Ambulatory Visit (INDEPENDENT_AMBULATORY_CARE_PROVIDER_SITE_OTHER): Payer: Managed Care, Other (non HMO) | Admitting: Family Medicine

## 2016-04-06 ENCOUNTER — Encounter: Payer: Self-pay | Admitting: Family Medicine

## 2016-04-06 VITALS — BP 132/82 | HR 64 | Temp 97.9°F | Ht 66.5 in | Wt 193.8 lb

## 2016-04-06 DIAGNOSIS — E038 Other specified hypothyroidism: Secondary | ICD-10-CM

## 2016-04-06 DIAGNOSIS — I10 Essential (primary) hypertension: Secondary | ICD-10-CM | POA: Diagnosis not present

## 2016-04-06 DIAGNOSIS — Z0000 Encounter for general adult medical examination without abnormal findings: Secondary | ICD-10-CM

## 2016-04-06 DIAGNOSIS — N181 Chronic kidney disease, stage 1: Secondary | ICD-10-CM

## 2016-04-06 DIAGNOSIS — N189 Chronic kidney disease, unspecified: Secondary | ICD-10-CM

## 2016-04-06 MED ORDER — LEVOTHYROXINE SODIUM 25 MCG PO TABS: ORAL_TABLET | ORAL | Status: DC

## 2016-04-06 NOTE — Patient Instructions (Addendum)
Work on [employer] exercise 150 min per week. Healthy eating, drinking water and weight loss.

## 2016-04-06 NOTE — Progress Notes (Signed)
The patient is here for annual wellness exam and preventative care.   Doing well overall.  She has been taking care of father with dementia in [place]. She has been very stressed out with these issues.  She is doing fairly well with her mental state. Still dealing with husband's death fairly well.  BP Readings from Last 3 Encounters:  04/06/16 132/82  02/23/15 128/88  02/06/14 151/94   Prediabetes: improved Wt Readings from Last 3 Encounters:  04/06/16 193 lb 12 oz (87.884 kg)  02/23/15 200 lb 8 oz (90.946 kg)  02/06/14 182 lb 4 oz (82.668 kg)   Lab Results  Component Value Date   HGBA1C 5.9 04/04/2016    Elevated Cholesterol: Well controlled on 40- mg of lipitor Lab Results  Component Value Date   CHOL 123 04/04/2016   HDL 46.00 04/04/2016   LDLCALC 54 04/04/2016   LDLDIRECT 128.4 06/27/2011   TRIG 118.0 04/04/2016   CHOLHDL 3 04/04/2016  Using medications without problems: Muscle aches:  Diet compliance:Moderate Exercise: none Other complaints:  Hypothyroid: good control on current dose of medication. Lab Results  Component Value Date   TSH 4.32 04/04/2016   Hypertension:  Well controlled on no med. BP Readings from Last 3 Encounters:  04/06/16 132/82  02/23/15 128/88  02/06/14 151/94  Using medication without problems or lightheadedness: None Chest pain with exertion: None Edema:None Short of breath:None Average home BP: 120/70s Other issues:  Great control of cholesterol.   [id number] on recheck.   Gout: Low uric acid on allopurinol in 2016 No gout flares in last year.  Social History /Family History/Past Medical History reviewed and updated if needed.   Review of Systems  Constitutional: Negative for fever and fatigue.  HENT: Negative for ear pain.  Eyes: Negative for pain.  Respiratory: Negative for chest tightness and shortness of breath.  Cardiovascular: Negative for chest pain, palpitations and leg swelling.   Gastrointestinal: Negative for abdominal pain.  Genitourinary: Negative for dysuria.       Objective:   Physical Exam  Constitutional: Vital signs are normal. She appears well-developed and well-nourished. She is cooperative. Non-toxic appearance. She does not appear ill. No distress.  HENT:  Head: Normocephalic.  Right Ear: Hearing, tympanic membrane, external ear and ear canal normal.  Left Ear: Hearing, tympanic membrane, external ear and ear canal normal.  Nose: Nose normal.  Eyes: Conjunctivae, EOM and lids are normal. Pupils are equal, round, and reactive to light. Lids are everted and swept, no foreign bodies found.  Neck: Trachea normal and normal range of motion. Neck supple. Carotid bruit is not present. No thyroid mass and no thyromegaly present.  Cardiovascular: Normal rate, regular rhythm, S1 normal, S2 normal, normal heart sounds and intact distal pulses. Exam reveals no gallop.  No murmur heard. Pulmonary/Chest: Effort normal and breath sounds normal. No respiratory distress. She has no wheezes. She has no rhonchi. She has no rales.  Abdominal: Soft. Normal appearance and bowel sounds are normal. She exhibits no distension, no fluid wave, no abdominal bruit and no mass. There is no hepatosplenomegaly. There is no tenderness. There is no rebound, no guarding and no CVA tenderness. No hernia.  Genitourinary: Vagina normal and uterus normal. No breast swelling, tenderness, discharge or bleeding. Pelvic exam was performed with patient supine. There is no rash, tenderness or lesion on the right labia. There is no rash, tenderness or lesion on the left labia. Uterus is not enlarged and not tender. Right adnexum displays no mass, no  tenderness and no fullness. Left adnexum displays no mass, no tenderness and no fullness.  Lymphadenopathy:   She has no cervical adenopathy.   She has no axillary adenopathy.  Neurological: She is alert. She has normal strength. No cranial  nerve deficit or sensory deficit.  Skin: Skin is warm, dry and intact. No rash noted.  Psychiatric: Her speech is normal and behavior is normal. Judgment normal. Her mood appears not anxious. Cognition and memory are normal. She does not exhibit a depressed mood.          Assessment & Plan:  The patient's preventative maintenance and recommended screening tests for an annual wellness exam were reviewed in full today. Brought up to date unless services declined.  Counselled on the importance of diet, exercise, and its role in overall health and mortality. The patient's FH and SH was reviewed, including their home life, tobacco status, and drug and alcohol status.   PAP/DVE: 02/2014, nml, no HPV, repeat in 5 years, yearly DVE Mammo: nml 12/2014, plan repeat every 2 years. Colon:Refuses any type of screen.Never had colonoscopy. Vaccines: Uptodate. Hep C: neg [id number]. Nonsmoker

## 2016-04-06 NOTE — Progress Notes (Signed)
Pre visit review using our clinic review tool, if applicable. No additional management support is needed unless otherwise documented below in the visit note. 

## 2016-04-06 NOTE — Assessment & Plan Note (Signed)
Avoid nephrotoxic meds, drink water, watch BP and sugar.

## 2016-04-18 ENCOUNTER — Other Ambulatory Visit: Payer: Self-pay | Admitting: Family Medicine

## 2016-04-25 ENCOUNTER — Other Ambulatory Visit: Payer: Self-pay | Admitting: Family Medicine

## 2016-08-22 ENCOUNTER — Encounter: Payer: Self-pay | Admitting: Family Medicine

## 2016-08-22 ENCOUNTER — Ambulatory Visit (INDEPENDENT_AMBULATORY_CARE_PROVIDER_SITE_OTHER): Payer: Managed Care, Other (non HMO) | Admitting: Family Medicine

## 2016-08-22 VITALS — BP 178/96 | HR 67 | Temp 97.9°F | Ht 66.5 in | Wt 198.0 lb

## 2016-08-22 DIAGNOSIS — G4700 Insomnia, unspecified: Secondary | ICD-10-CM | POA: Insufficient documentation

## 2016-08-22 DIAGNOSIS — F5104 Psychophysiologic insomnia: Secondary | ICD-10-CM | POA: Diagnosis not present

## 2016-08-22 DIAGNOSIS — I10 Essential (primary) hypertension: Secondary | ICD-10-CM

## 2016-08-22 LAB — CBC WITH DIFFERENTIAL/PLATELET
BASOS ABS: 0 10*3/uL (ref 0.0–0.1)
Basophils Relative: 0.5 % (ref 0.0–3.0)
EOS ABS: 0.2 10*3/uL (ref 0.0–0.7)
Eosinophils Relative: 1.7 % (ref 0.0–5.0)
HCT: 36.4 % (ref 36.0–46.0)
Hemoglobin: 11.8 g/dL — ABNORMAL LOW (ref 12.0–15.0)
LYMPHS ABS: 2.2 10*3/uL (ref 0.7–4.0)
LYMPHS PCT: 24.1 % (ref 12.0–46.0)
MCHC: 32.5 g/dL (ref 30.0–36.0)
MCV: 87 fl (ref 78.0–100.0)
MONOS PCT: 4.1 % (ref 3.0–12.0)
Monocytes Absolute: 0.4 10*3/uL (ref 0.1–1.0)
NEUTROS PCT: 69.6 % (ref 43.0–77.0)
Neutro Abs: 6.3 10*3/uL (ref 1.4–7.7)
PLATELETS: 358 10*3/uL (ref 150.0–400.0)
RBC: 4.18 Mil/uL (ref 3.87–5.11)
RDW: 15.9 % — ABNORMAL HIGH (ref 11.5–15.5)
WBC: 9.1 10*3/uL (ref 4.0–10.5)

## 2016-08-22 LAB — COMPREHENSIVE METABOLIC PANEL
ALT: 18 U/L (ref 0–35)
AST: 22 U/L (ref 0–37)
Albumin: 4.4 g/dL (ref 3.5–5.2)
Alkaline Phosphatase: 93 U/L (ref 39–117)
BUN: 16 mg/dL (ref 6–23)
CHLORIDE: 105 meq/L (ref 96–112)
CO2: 28 mEq/L (ref 19–32)
Calcium: 9.6 mg/dL (ref 8.4–10.5)
Creatinine, Ser: 1.13 mg/dL (ref 0.40–1.20)
GFR: 52.18 mL/min — ABNORMAL LOW (ref >60.00)
GLUCOSE: 91 mg/dL (ref 70–99)
POTASSIUM: 4.2 meq/L (ref 3.5–5.1)
SODIUM: 141 meq/L (ref 135–145)
Total Bilirubin: 0.5 mg/dL (ref 0.2–1.2)
Total Protein: 7.3 g/dL (ref 6.0–8.3)

## 2016-08-22 LAB — T4, FREE: Free T4: 0.75 ng/dL (ref 0.60–1.60)

## 2016-08-22 LAB — TSH: TSH: 2.76 u[IU]/mL (ref 0.35–4.50)

## 2016-08-22 LAB — T3, FREE: T3, Free: 2.9 pg/mL (ref 2.3–4.2)

## 2016-08-22 MED ORDER — LISINOPRIL-HYDROCHLOROTHIAZIDE 20-25 MG PO TABS: 1.0000 | ORAL_TABLET | Freq: Every day | ORAL | 3 refills | Status: DC

## 2016-08-22 NOTE — Patient Instructions (Signed)
Work on exercise weight loss and healthy low salt diet.  Stop at lab on way out.  Start lisinopril HCTZ daily.  Follow BP at home , record and bring back to next OV.

## 2016-08-22 NOTE — Addendum Note (Signed)
Addended by: Eliezer Lofts E on: 08/22/2016 11:27 AM   Modules accepted: Orders

## 2016-08-22 NOTE — Progress Notes (Signed)
Subjective:    Patient ID: Virginia Wilson, female    DOB: 1956-06-28, 60 y.o.   MRN: [id number]  HPI    60 year old female with HTN presents with elevated BPs.  Previously well controlled BP on no med. She has noted BP has been elevated in last few weeks.  Intermittently lightheaded and nauseous. Occ headache , mild , no vision change, no new neuro changes.  BP at home has been 170/100. No swelling.   She has been under a lot of stress lately with selling fathers house. Poor sleep at night.  She tried melatonin for sleep.  When she is busy she is not anxious.. She does have some issues.   BP Readings from Last 3 Encounters:  08/22/16 (!) 178/96  04/06/16 132/82  02/23/15 128/88   Wt Readings from Last 3 Encounters:  08/22/16 198 lb (89.8 kg)  04/06/16 193 lb 12 oz (87.9 kg)  02/23/15 200 lb 8 oz (90.9 kg)   Not on any new meds.  No increase in salt in diet.   HX of CKD stage 3  Nonsmoker. Was on atenolol in past and HCTZ.  Review of Systems  Constitutional: Negative for fatigue and fever.  HENT: Negative for ear pain.   Eyes: Negative for pain.  Respiratory: Negative for chest tightness and shortness of breath.   Cardiovascular: Negative for chest pain, palpitations and leg swelling.  Gastrointestinal: Negative for abdominal pain.  Genitourinary: Negative for dysuria.       Objective:   Physical Exam  Constitutional: Vital signs are normal. She appears well-developed and well-nourished. She is cooperative.  Non-toxic appearance. She does not appear ill. No distress.  HENT:  Head: Normocephalic.  Right Ear: Hearing, tympanic membrane, external ear and ear canal normal. Tympanic membrane is not erythematous, not retracted and not bulging.  Left Ear: Hearing, tympanic membrane, external ear and ear canal normal. Tympanic membrane is not erythematous, not retracted and not bulging.  Nose: No mucosal edema or rhinorrhea. Right sinus exhibits no maxillary sinus  tenderness and no frontal sinus tenderness. Left sinus exhibits no maxillary sinus tenderness and no frontal sinus tenderness.  Mouth/Throat: Uvula is midline, oropharynx is clear and moist and mucous membranes are normal.  Eyes: Conjunctivae, EOM and lids are normal. Pupils are equal, round, and reactive to light. Lids are everted and swept, no foreign bodies found.  Fundoscopic exam:      The right eye shows no AV nicking.       The left eye shows no AV nicking.  Neck: Trachea normal and normal range of motion. Neck supple. Carotid bruit is not present. No thyroid mass and no thyromegaly present.  Cardiovascular: Normal rate, regular rhythm, S1 normal, S2 normal, normal heart sounds, intact distal pulses and normal pulses.  Exam reveals no gallop and no friction rub.   No murmur heard. Pulmonary/Chest: Effort normal and breath sounds normal. No tachypnea. No respiratory distress. She has no decreased breath sounds. She has no wheezes. She has no rhonchi. She has no rales.  Abdominal: Soft. Normal appearance and bowel sounds are normal. There is no tenderness.  Neurological: She is alert.  Skin: Skin is warm, dry and intact. No rash noted.  Psychiatric: Her speech is normal and behavior is normal. Judgment and thought content normal. Her mood appears not anxious. Cognition and memory are normal. She does not exhibit a depressed mood.          Assessment & Plan:

## 2016-08-22 NOTE — Progress Notes (Signed)
Pre visit review using our clinic review tool, if applicable. No additional management support is needed unless otherwise documented below in the visit note. 

## 2016-08-22 NOTE — Assessment & Plan Note (Addendum)
Eval for secondary cause with labs. May be due to stress increase and weight gain.  Start ACEI/duretic given CKD. Recheck cr in 2 weeks.

## 2016-08-22 NOTE — Assessment & Plan Note (Signed)
Decrease stress.  Work on [employer]. Info on sleep protocol provided.

## 2016-09-07 ENCOUNTER — Ambulatory Visit (INDEPENDENT_AMBULATORY_CARE_PROVIDER_SITE_OTHER): Payer: Managed Care, Other (non HMO) | Admitting: Family Medicine

## 2016-09-07 ENCOUNTER — Encounter: Payer: Self-pay | Admitting: Family Medicine

## 2016-09-07 VITALS — BP 115/75 | HR 59 | Temp 97.6°F | Ht 66.5 in | Wt 194.5 lb

## 2016-09-07 DIAGNOSIS — I10 Essential (primary) hypertension: Secondary | ICD-10-CM | POA: Diagnosis not present

## 2016-09-07 LAB — BASIC METABOLIC PANEL
BUN: 26 mg/dL — AB (ref 6–23)
CALCIUM: 10.2 mg/dL (ref 8.4–10.5)
CO2: 28 mEq/L (ref 19–32)
CREATININE: 1.45 mg/dL — AB (ref 0.40–1.20)
Chloride: 102 mEq/L (ref 96–112)
GFR: 39.13 mL/min — AB (ref >60.00)
Glucose, Bld: 95 mg/dL (ref 70–99)
Potassium: 4.7 mEq/L (ref 3.5–5.1)
Sodium: 139 mEq/L (ref 135–145)

## 2016-09-07 NOTE — Patient Instructions (Signed)
Continue current BP medication.   Please stop at the front desk or lab to set up referral or to have labs drawn.

## 2016-09-07 NOTE — Assessment & Plan Note (Signed)
Excellent control on lisinopril HCTZ. Still slight dizziness but improving.  Check Cr today.

## 2016-09-07 NOTE — Progress Notes (Signed)
Pre visit review using our clinic review tool, if applicable. No additional management support is needed unless otherwise documented below in the visit note. 

## 2016-09-07 NOTE — Progress Notes (Signed)
   Subjective:    Patient ID: Virginia Wilson, female    DOB: 1955/12/13, 60 y.o.   MRN: [id number]  HPI  60 year old female presents for  2 week follow up HTN.   Hypertension:  Excellent control on lisinopril/HCTZ.  No SE.   BP Readings from Last 3 Encounters:  09/07/16 115/75  08/22/16 (!) 178/96  04/06/16 132/82  Using medication without problems or lightheadedness:  Occ when she is active. This SE may be improving some with time. Chest pain with exertion: None Edema:None Short of breath: None Average home BPs: 115-139/69-81 Other issues:   Sleep issues have improved .  Stress improved overall.   Review of Systems  Constitutional: Negative for fatigue and fever.  HENT: Negative for ear pain.   Eyes: Negative for pain.  Respiratory: Negative for chest tightness and shortness of breath.   Cardiovascular: Negative for chest pain, palpitations and leg swelling.  Gastrointestinal: Negative for abdominal pain.  Genitourinary: Negative for dysuria.       Objective:   Physical Exam  Constitutional: Vital signs are normal. She appears well-developed and well-nourished. She is cooperative.  Non-toxic appearance. She does not appear ill. No distress.  HENT:  Head: Normocephalic.  Right Ear: Hearing, tympanic membrane, external ear and ear canal normal. Tympanic membrane is not erythematous, not retracted and not bulging.  Left Ear: Hearing, tympanic membrane, external ear and ear canal normal. Tympanic membrane is not erythematous, not retracted and not bulging.  Nose: No mucosal edema or rhinorrhea. Right sinus exhibits no maxillary sinus tenderness and no frontal sinus tenderness. Left sinus exhibits no maxillary sinus tenderness and no frontal sinus tenderness.  Mouth/Throat: Uvula is midline, oropharynx is clear and moist and mucous membranes are normal.  Eyes: Conjunctivae, EOM and lids are normal. Pupils are equal, round, and reactive to light. Lids are everted and swept, no  foreign bodies found.  Neck: Trachea normal and normal range of motion. Neck supple. Carotid bruit is not present. No thyroid mass and no thyromegaly present.  Cardiovascular: Normal rate, regular rhythm, S1 normal, S2 normal, normal heart sounds, intact distal pulses and normal pulses.  Exam reveals no gallop and no friction rub.   No murmur heard. Pulmonary/Chest: Effort normal and breath sounds normal. No tachypnea. No respiratory distress. She has no decreased breath sounds. She has no wheezes. She has no rhonchi. She has no rales.  Abdominal: Soft. Normal appearance and bowel sounds are normal. There is no tenderness.  Neurological: She is alert.  Skin: Skin is warm, dry and intact. No rash noted.  Psychiatric: Her speech is normal and behavior is normal. Judgment and thought content normal. Her mood appears not anxious. Cognition and memory are normal. She does not exhibit a depressed mood.          Assessment & Plan:

## 2016-09-11 ENCOUNTER — Other Ambulatory Visit: Payer: Self-pay | Admitting: *Deleted

## 2016-09-11 MED ORDER — LISINOPRIL-HYDROCHLOROTHIAZIDE 20-25 MG PO TABS: 1.0000 | ORAL_TABLET | Freq: Every day | ORAL | 1 refills | Status: DC

## 2016-09-19 ENCOUNTER — Other Ambulatory Visit: Payer: Self-pay | Admitting: Family Medicine

## 2016-09-19 DIAGNOSIS — N289 Disorder of kidney and ureter, unspecified: Secondary | ICD-10-CM

## 2016-09-20 ENCOUNTER — Other Ambulatory Visit (INDEPENDENT_AMBULATORY_CARE_PROVIDER_SITE_OTHER): Payer: Managed Care, Other (non HMO)

## 2016-09-20 DIAGNOSIS — I10 Essential (primary) hypertension: Secondary | ICD-10-CM

## 2016-09-20 LAB — BASIC METABOLIC PANEL
BUN: 32 mg/dL — ABNORMAL HIGH (ref 6–23)
CHLORIDE: 102 meq/L (ref 96–112)
CO2: 27 mEq/L (ref 19–32)
Calcium: 9.6 mg/dL (ref 8.4–10.5)
Creatinine, Ser: 1.46 mg/dL — ABNORMAL HIGH (ref 0.40–1.20)
GFR: 38.82 mL/min — AB (ref >60.00)
GLUCOSE: 105 mg/dL — AB (ref 70–99)
POTASSIUM: 4.2 meq/L (ref 3.5–5.1)
SODIUM: 137 meq/L (ref 135–145)

## 2016-09-21 NOTE — Addendum Note (Signed)
Addended by: Carter Kitten on: 09/21/2016 01:23 PM   Modules accepted: Orders

## 2016-11-21 ENCOUNTER — Telehealth: Payer: Self-pay

## 2016-11-21 ENCOUNTER — Other Ambulatory Visit: Payer: Self-pay | Admitting: Family Medicine

## 2016-11-21 MED ORDER — LISINOPRIL 10 MG PO TABS: 10.0000 mg | ORAL_TABLET | Freq: Every day | ORAL | 3 refills | Status: DC

## 2016-11-21 NOTE — Telephone Encounter (Signed)
Pt left v/m requesting lower dosage of lisnopril HCTZ to [facility]. See lab result note 09/20/16.

## 2016-11-21 NOTE — Telephone Encounter (Signed)
Let pt know.. I was misleading with my last recommendation. There is no smaller size tablet of the lisinopril HCTZ.. We would simply eliminate one of the medications ( the HCTZ ) from the combo pill. If BP still running low on lisinopril 10 mg alone.. We could decrease to lisinopril 5 mg daily.  Let me know if Virginia Wilson is agreeable.

## 2016-11-21 NOTE — Telephone Encounter (Signed)
Virginia Wilson notified as instructed by telephone.  She is agreeable to changing to Lisinopril 10 mg daily.  Please send in prescription to [facility].

## 2017-03-12 ENCOUNTER — Other Ambulatory Visit: Payer: Self-pay | Admitting: Family Medicine

## 2017-04-24 ENCOUNTER — Emergency Department
Admission: EM | Admit: 2017-04-24 | Discharge: 2017-04-24 | Disposition: A | Discharge status: Home / Self Care | Payer: 59 | Attending: Emergency Medicine | Admitting: Emergency Medicine

## 2017-04-24 ENCOUNTER — Encounter: Payer: Self-pay | Admitting: Emergency Medicine

## 2017-04-24 ENCOUNTER — Ambulatory Visit: Payer: Self-pay | Admitting: Primary Care

## 2017-04-24 DIAGNOSIS — R609 Edema, unspecified: Secondary | ICD-10-CM | POA: Diagnosis present

## 2017-04-24 DIAGNOSIS — Z0289 Encounter for other administrative examinations: Secondary | ICD-10-CM

## 2017-04-24 DIAGNOSIS — J45909 Unspecified asthma, uncomplicated: Secondary | ICD-10-CM | POA: Insufficient documentation

## 2017-04-24 DIAGNOSIS — Z79899 Other long term (current) drug therapy: Secondary | ICD-10-CM | POA: Diagnosis not present

## 2017-04-24 DIAGNOSIS — T782XXA Anaphylactic shock, unspecified, initial encounter: Secondary | ICD-10-CM | POA: Diagnosis not present

## 2017-04-24 DIAGNOSIS — I129 Hypertensive chronic kidney disease with stage 1 through stage 4 chronic kidney disease, or unspecified chronic kidney disease: Secondary | ICD-10-CM | POA: Insufficient documentation

## 2017-04-24 DIAGNOSIS — N181 Chronic kidney disease, stage 1: Secondary | ICD-10-CM | POA: Insufficient documentation

## 2017-04-24 MED ORDER — METHYLPREDNISOLONE SODIUM SUCC 125 MG IJ SOLR
125.0000 mg | Freq: Once | INTRAMUSCULAR | Status: AC
  Administered 2017-04-24: 125 mg via INTRAVENOUS

## 2017-04-24 MED ORDER — EPINEPHRINE 0.3 MG/0.3ML IJ SOAJ
0.3000 mg | Freq: Once | INTRAMUSCULAR | Status: AC
  Administered 2017-04-24: 0.3 mg via INTRAMUSCULAR

## 2017-04-24 MED ORDER — FAMOTIDINE IN NACL 20-0.9 MG/50ML-% IV SOLN
INTRAVENOUS | Status: AC
  Administered 2017-04-24: 20 mg via INTRAVENOUS
  Filled 2017-04-24: qty 50

## 2017-04-24 MED ORDER — EPINEPHRINE 0.3 MG/0.3ML IJ SOAJ: 0.3000 mg | Freq: Once | INTRAMUSCULAR | 1 refills | Status: AC

## 2017-04-24 MED ORDER — DIPHENHYDRAMINE HCL 50 MG/ML IJ SOLN
25.0000 mg | Freq: Once | INTRAMUSCULAR | Status: AC
  Administered 2017-04-24: 25 mg via INTRAVENOUS

## 2017-04-24 MED ORDER — FAMOTIDINE 20 MG PO TABS: 20.0000 mg | ORAL_TABLET | Freq: Every day | ORAL | 0 refills | Status: DC

## 2017-04-24 MED ORDER — METHYLPREDNISOLONE SODIUM SUCC 125 MG IJ SOLR
INTRAMUSCULAR | Status: AC
  Filled 2017-04-24: qty 2

## 2017-04-24 MED ORDER — FAMOTIDINE IN NACL 20-0.9 MG/50ML-% IV SOLN
20.0000 mg | Freq: Once | INTRAVENOUS | Status: AC
  Administered 2017-04-24: 20 mg via INTRAVENOUS

## 2017-04-24 MED ORDER — SODIUM CHLORIDE 0.9 % IV BOLUS (SEPSIS)
1000.0000 mL | Freq: Once | INTRAVENOUS | Status: AC
  Administered 2017-04-24: 1000 mL via INTRAVENOUS

## 2017-04-24 MED ORDER — ALBUTEROL SULFATE (2.5 MG/3ML) 0.083% IN NEBU
5.0000 mg | INHALATION_SOLUTION | Freq: Once | RESPIRATORY_TRACT | Status: AC
  Administered 2017-04-24: 5 mg via RESPIRATORY_TRACT

## 2017-04-24 MED ORDER — ALBUTEROL SULFATE (2.5 MG/3ML) 0.083% IN NEBU
INHALATION_SOLUTION | RESPIRATORY_TRACT | Status: AC
  Administered 2017-04-24: 5 mg via RESPIRATORY_TRACT
  Filled 2017-04-24: qty 6

## 2017-04-24 MED ORDER — PREDNISONE 20 MG PO TABS: 60.0000 mg | ORAL_TABLET | Freq: Every day | ORAL | 0 refills | Status: AC

## 2017-04-24 MED ORDER — DIPHENHYDRAMINE HCL 50 MG/ML IJ SOLN
INTRAMUSCULAR | Status: AC
  Administered 2017-04-24: 25 mg via INTRAVENOUS
  Filled 2017-04-24: qty 1

## 2017-04-24 MED ORDER — EPINEPHRINE 0.3 MG/0.3ML IJ SOAJ
INTRAMUSCULAR | Status: AC
  Filled 2017-04-24: qty 0.3

## 2017-04-24 NOTE — ED Provider Notes (Signed)
[facility] Emergency Department Provider Note  ____________________________________________  Time seen: Approximately 1:10 PM  I have reviewed the triage vital signs and the nursing notes.   HISTORY  Chief Complaint Angioedema   HPI Virginia Wilson is a 61 y.o. female with history of allergic rhinitis, GERD, asthma, hypertension who presents for evaluation of swelling to her eyelids and difficulty breathing. Patient reports that she was sitting outside her office next to a construction site where a roof was being redone when she started feeling itching around her eyes. When she walked back into her office she noticed that her eyes were swollen and itchy. She is also complaining of mild swelling of her tongue, chest tightness, and difficulty breathing. She denies wheezing. She is complaining of itching sensation in her palms. She denies hives. She denies prior history of anaphylaxis. Patient is on lisinopril. No new medications. No new foods. Patient denies vomiting or diarrhea.  Past Medical History:  Diagnosis Date  . Allergic rhinitis, cause unspecified   . Chronic kidney disease, unspecified   . Esophageal reflux   . Extrinsic asthma, unspecified   . Family history of ischemic heart disease   . Obesity, unspecified   . Premenstrual tension syndromes   . Pure hypercholesterolemia   . Symptomatic menopausal or female climacteric states   . Unspecified essential hypertension   . Unspecified hypothyroidism     Patient Active Problem List   Diagnosis Date Noted  . Anaphylactic syndrome 04/29/2017  . Insomnia 08/22/2016  . Prediabetes 02/23/2015  . Chronic renal insufficiency, stage I 02/06/2014  . Gout with manifestations 02/23/2011  . PSORIASIS, SCALP 12/02/2010  . Hypothyroidism 05/22/2007  . HYPERCHOLESTEROLEMIA 01/21/2007  . Essential hypertension, benign 01/21/2007  . ALLERGIC RHINITIS 01/21/2007  . ASTHMA, CHILDHOOD 01/21/2007  . GERD  01/21/2007  . MIGRAINE, MENSTRUAL 01/21/2007  . MENOPAUSAL SYNDROME 01/21/2007    Past Surgical History:  Procedure Laterality Date  . ovarian repair    . TONSILLECTOMY      Prior to Admission medications   Medication Sig Start Date End Date Taking? Authorizing Provider  allopurinol (ZYLOPRIM) 300 MG tablet Take 1 tablet by mouth  daily 04/18/16  Yes Bedsole, Amy E, MD  atorvastatin (LIPITOR) 40 MG tablet Take 1 tablet by mouth  daily 04/18/16  Yes Bedsole, Amy E, MD  betamethasone dipropionate (DIPROLENE) 0.05 % cream Apply topically 2 (two) times daily. 02/23/15  Yes Bedsole, Amy E, MD  fluocinonide (LIDEX) 0.05 % external solution Apply 1 application topically daily as needed. 02/23/15  Yes Bedsole, Amy E, MD  levothyroxine (SYNTHROID, LEVOTHROID) 25 MCG tablet Take 1 tablet (25 mcg total) by mouth daily before breakfast. 03/13/17  Yes Bedsole, Amy E, MD  lisinopril (PRINIVIL,ZESTRIL) 10 MG tablet Take 1 tablet (10 mg total) by mouth daily. 11/21/16  Yes Bedsole, Amy E, MD  Omega-3 Fatty Acids (FISH OIL) 1200 MG CAPS Take 1 capsule by mouth daily.   Yes [provider]  omeprazole (PRILOSEC) 20 MG capsule Take 20 mg by mouth daily.     Yes [provider]  famotidine (PEPCID) 20 MG tablet Take 1 tablet (20 mg total) by mouth daily. 04/24/17 04/28/17  Rudene Re, MD    Allergies Penicillins and Sulfonamide derivatives  Family History  Problem Relation Age of Onset  . Hypertension Mother   . Diabetes Mother   . Stroke Mother   . Diabetes Father   . Coronary artery disease Father   . Heart attack Father   .  Hypertension Brother   . Hypertension Brother   . Drug abuse Brother     Social History Social History  Substance Use Topics  . Smoking status: Never Smoker  . Smokeless tobacco: Never Used  . Alcohol use No    Review of Systems  Constitutional: Negative for fever. Eyes: Negative for visual changes. ENT: Negative for sore throat. + tongue  swelling Neck: No neck pain  Cardiovascular: Negative for chest pain. Respiratory: + shortness of breath. Gastrointestinal: Negative for abdominal pain, vomiting or diarrhea. Genitourinary: Negative for dysuria. Musculoskeletal: Negative for back pain. Skin: Negative for rash. + itchy hands Neurological: Negative for headaches, weakness or numbness. Psych: No SI or HI  ____________________________________________   PHYSICAL EXAM:  VITAL SIGNS: ED Triage Vitals  Enc Vitals Group     BP 04/24/17 1237 133/75     Pulse Rate 04/24/17 1237 83     Resp 04/24/17 1237 20     Temp 04/24/17 1237 97.8 F (36.6 C)     Temp Source 04/24/17 1237 Oral     SpO2 04/24/17 1237 98 %     Weight 04/24/17 1237 195 lb (88.5 kg)     Height 04/24/17 1237 5\' 7"  (1.702 m)     Head Circumference --      Peak Flow --      Pain Score 04/24/17 1236 6     Pain Loc --      Pain Edu? --      Excl. in [place]? --     Constitutional: Alert and oriented. Well appearing and in no apparent distress. HEENT:      Head: Normocephalic and atraumatic.         Eyes: Conjunctivae are normal. Sclera is non-icteric. Bilateral eyelids are swollen       Mouth/Throat: Mucous membranes are moist. No angioedema, normal size tongue, uvula is midline with no swelling, lips are normal, no stridor, airways patent       Neck: Supple with no signs of meningismus. Cardiovascular: Regular rate and rhythm. No murmurs, gallops, or rubs. 2+ symmetrical distal pulses are present in all extremities. No JVD. Respiratory: Normal respiratory effort. Lungs are clear to auscultation bilaterally. No wheezes, crackles, or rhonchi.  Gastrointestinal: Soft, non tender, and non distended with positive bowel sounds. No rebound or guarding. Musculoskeletal: Nontender with normal range of motion in all extremities. No edema, cyanosis, or erythema of extremities. Neurologic: Normal speech and language. Face is symmetric. Moving all extremities. No gross  focal neurologic deficits are appreciated. Skin: Skin is warm, dry and intact. No rash noted. Psychiatric: Mood and affect are normal. Speech and behavior are normal.  ____________________________________________   LABS (all labs ordered are listed, but only abnormal results are displayed)  Labs Reviewed - No data to display ____________________________________________  EKG  ED ECG REPORT I, Rudene Re, the attending physician, personally viewed and interpreted this ECG.  Normal sinus rhythm, rate of 93, normal intervals, normal axis, no ST elevations or depressions. Normal EKG. ____________________________________________  RADIOLOGY  none  ____________________________________________   PROCEDURES  Procedure(s) performed: None Procedures Critical Care performed:  None ____________________________________________   INITIAL IMPRESSION / ASSESSMENT AND PLAN / ED COURSE  61 y.o. female with history of allergic rhinitis, GERD, asthma, hypertension who presents for evaluation of swelling to her eyelids and difficulty breathing, and itching to her hands after being exposed to construction dust earlier today. Patient is well-appearing, normal work of breathing, normal vital signs, normal sats, no stridor, no wheezing,  moving great air, no hives, no angioedema. She does have swelling of her eyelids bilaterally. I do not believe at this time that this is due to lisinopril and I do think this is most likely environmental exposure and anaphylaxis. Patient will be given epipen, solumedrol, pepcid, and benadryl and will closely monitored in the ED on telemetry.  Clinical Course as of Apr 30 1409  Tue Apr 24, 2017  1649 Patient appears improved, she is stable for outpatient follow-up.  [JW]    Clinical Course User Index [JW] Earleen Newport, MD   ----------------------------------------- 1:16 PM on 04/24/2017 -----------------------------------------   OBSERVATION  CARE: This patient is being placed under observation care for the following reasons: Allergic reaction/anaphylaxis requiring treatment and serial exams to determine response to treatment  ----------------------------------------- 2:16 PM on 04/24/2017 ----------------------------------------- Swelling improved after epinephrine, itching resolved, SOB improved. Will continue to monitor. Due to patient's response to epinephrine again I am more concerned that this anaphylactic reaction and not an ACE inhibitor induced reaction.  _________________________ 3:27 PM on 04/24/2017 -----------------------------------------  Patient will be monitored until 4PM and plan to dc home with epipen, pepcid, steroids, and close f/u with PCP. Care transferred to Dr. Jimmye Norman  _________________________ 5:22 PM on 04/24/2017 ----------------------------------------- Patient is improved, she'll be discharged as previously planned. Jimmye Norman, Algis Liming, MD, 04/24/17 2237)   Pertinent labs & imaging results that were available during my care of the patient were reviewed by me and considered in my medical decision making (see chart for details).    ____________________________________________   FINAL CLINICAL IMPRESSION(S) / ED DIAGNOSES  Final diagnoses:  Anaphylaxis, initial encounter      NEW MEDICATIONS STARTED DURING THIS VISIT:  Discharge Medication List as of 04/24/2017  4:55 PM    START taking these medications   Details  EPINEPHrine 0.3 mg/0.3 mL IJ SOAJ injection Inject 0.3 mLs (0.3 mg total) into the muscle once., Starting Tue 04/24/2017, Print    famotidine (PEPCID) 20 MG tablet Take 1 tablet (20 mg total) by mouth daily., Starting Tue 04/24/2017, Until Sat 04/28/2017, Print    predniSONE (DELTASONE) 20 MG tablet Take 3 tablets (60 mg total) by mouth daily., Starting Tue 04/24/2017, Until Sat 04/28/2017, Print         Note:  This document was prepared using Dragon voice recognition  software and may include unintentional dictation errors.    Alfred Levins, [place], MD 04/30/17 [phone]

## 2017-04-24 NOTE — ED Triage Notes (Signed)
Pt come into the ED via POV c/o allergic reaction after sitting outside at work.  Patient unsure what she is having an allergic reaction to.  Patient has noticeable swelling to her eyes and states she feels as though her throat is swollen.  C/o shortness of breath associated with the swelling and noticeable swelling to the tongue.  Patient calm and cooperative at this time and took 25 mg benadryl 20 minutes prior to arrival.

## 2017-04-24 NOTE — ED Provider Notes (Signed)
Patient is improved, she'll be discharged as previously planned.   Earleen Newport, MD 04/24/17 2237

## 2017-04-24 NOTE — ED Notes (Signed)
Patient denies pain and is resting comfortably.  

## 2017-04-27 ENCOUNTER — Encounter: Payer: Self-pay | Admitting: Family Medicine

## 2017-04-27 ENCOUNTER — Ambulatory Visit (INDEPENDENT_AMBULATORY_CARE_PROVIDER_SITE_OTHER): Payer: 59 | Admitting: Family Medicine

## 2017-04-27 VITALS — BP 122/82 | HR 80 | Temp 98.4°F | Wt 202.5 lb

## 2017-04-27 DIAGNOSIS — T782XXD Anaphylactic shock, unspecified, subsequent encounter: Secondary | ICD-10-CM

## 2017-04-27 NOTE — Patient Instructions (Signed)
Rosaria Ferries will call about your referral. If you have any similar symptoms then use the epipen and go to the ER.  Take care.  Glad to see you.  I'll update Bedsole.

## 2017-04-27 NOTE — Progress Notes (Signed)
Presumed environmental allergy with itching, SOB, eyelid swelling, B finger swelling. Sx started and evolved within minutes.  This happened when she was at work. Clearly improved with epinephrine at ER.   Emergency room course discussed with patient. No hx allergy sx like this prev.  She clearly feels better now.  She has an epipen.    PMH and SH reviewed FH noted.  Grandson with severe nut allergy.    ROS: Per HPI unless specifically indicated in ROS section   Meds, vitals, and allergies reviewed.   GEN: nad, alert and oriented HEENT: mucous membranes moist, she reports that her lower eyelid still feels slightly puffy but it does not look obvious to me on exam. No lip or tongue swelling NECK: supple w/o LA, no stridor CV: rrr.  no murmur PULM: ctab, no inc wob ABD: soft, +bs EXT: no edema SKIN: no acute rash

## 2017-04-29 ENCOUNTER — Encounter: Payer: Self-pay | Admitting: Family Medicine

## 2017-04-29 DIAGNOSIS — T782XXA Anaphylactic shock, unspecified, initial encounter: Secondary | ICD-10-CM | POA: Insufficient documentation

## 2017-04-29 NOTE — Assessment & Plan Note (Signed)
Discussed with patient about allergy/anaphylaxis pathophysiology. Presumed environmental allergy. Continue with histamine blockade. She is finishing prednisone treatment. Refer to allergy clinic. She has EpiPen to use. Discussed routine indication for use with ER evaluation if needed. Okay for outpatient follow-up. She agrees. Routed to PCP for FYI. >25 minutes spent in face to face time with patient, >50% spent in counselling or coordination of care.

## 2017-05-17 ENCOUNTER — Ambulatory Visit (INDEPENDENT_AMBULATORY_CARE_PROVIDER_SITE_OTHER): Payer: 59 | Admitting: Family Medicine

## 2017-05-17 ENCOUNTER — Encounter: Payer: Self-pay | Admitting: Family Medicine

## 2017-05-17 ENCOUNTER — Telehealth: Payer: Self-pay | Admitting: Internal Medicine

## 2017-05-17 DIAGNOSIS — R252 Cramp and spasm: Secondary | ICD-10-CM | POA: Diagnosis not present

## 2017-05-17 DIAGNOSIS — M79671 Pain in right foot: Secondary | ICD-10-CM | POA: Insufficient documentation

## 2017-05-17 DIAGNOSIS — R059 Cough, unspecified: Secondary | ICD-10-CM | POA: Insufficient documentation

## 2017-05-17 DIAGNOSIS — R05 Cough: Secondary | ICD-10-CM

## 2017-05-17 LAB — CBC
HCT: 25.2 % — ABNORMAL LOW (ref 36.0–46.0)
MCHC: 31.2 g/dL (ref 30.0–36.0)
MCV: 84.3 fl (ref 78.0–100.0)
Platelets: 481 10*3/uL — ABNORMAL HIGH (ref 150.0–400.0)
RBC: 2.99 Mil/uL — ABNORMAL LOW (ref 3.87–5.11)
RDW: 15.9 % — AB (ref 11.5–15.5)
WBC: 7.5 10*3/uL (ref 4.0–10.5)

## 2017-05-17 LAB — TSH: TSH: 2.06 u[IU]/mL (ref 0.35–4.50)

## 2017-05-17 LAB — CK: Total CK: 185 U/L — ABNORMAL HIGH (ref 7–177)

## 2017-05-17 LAB — BASIC METABOLIC PANEL
BUN: 16 mg/dL (ref 6–23)
CHLORIDE: 107 meq/L (ref 96–112)
CO2: 28 meq/L (ref 19–32)
Calcium: 9.2 mg/dL (ref 8.4–10.5)
Creatinine, Ser: 1.16 mg/dL (ref 0.40–1.20)
GFR: 50.51 mL/min — ABNORMAL LOW (ref >60.00)
Glucose, Bld: 88 mg/dL (ref 70–99)
Potassium: 4.1 mEq/L (ref 3.5–5.1)
SODIUM: 140 meq/L (ref 135–145)

## 2017-05-17 NOTE — Progress Notes (Signed)
Subjective:    Patient ID: Virginia Wilson, female    DOB: 04/17/1956, 61 y.o.   MRN: [id number]  HPI   61 year old female patient with history do HTN, CRI, hypothyroid, prediabetes presents with new onset leg cramps.  She has hx of leg cramps off and on for years, worse in last few weeks.  Has improved some now Tightness, burning in calves with activity, bilaterally.   She is also having foot pain  on right x 2 weeks. Pain over dorsum of foot.  no fall or no injury  Hx of gout.. No redness or swelling in foot.  No new medications.  Drinks a lot of water. Has been on Lipitor for a long time.  She was also seen at urgent care 4 days ago for dry cough x 2 weeks.Marland Kitchen  VIRAL BRONCHITIS.Marland Kitchentreated with cough syrup.  Feels good otherwise. No fever. No SOB, occ chest heaviness. No ear pain, no face pain.   She stopped her lisinopril given possible anaphylactic response.  Blood pressure 120/86, pulse 79, temperature 98.8 F (37.1 C), temperature source Oral, height 5' 6.5" (1.689 m), weight 201 lb (91.2 kg).   Review of Systems  Constitutional: Positive for fatigue. Negative for fever.  HENT: Negative for ear pain.   Eyes: Negative for pain.  Respiratory: Negative for chest tightness and shortness of breath.   Cardiovascular: Negative for chest pain, palpitations and leg swelling.  Gastrointestinal: Negative for abdominal pain.  Genitourinary: Negative for dysuria.       Objective:   Physical Exam  Constitutional: Vital signs are normal. She appears well-developed and well-nourished. She is cooperative.  Non-toxic appearance. She does not appear ill. No distress.  overweight  HENT:  Head: Normocephalic.  Right Ear: Hearing, tympanic membrane, external ear and ear canal normal. Tympanic membrane is not erythematous, not retracted and not bulging.  Left Ear: Hearing, tympanic membrane, external ear and ear canal normal. Tympanic membrane is not erythematous, not retracted and not  bulging.  Nose: No mucosal edema or rhinorrhea. Right sinus exhibits no maxillary sinus tenderness and no frontal sinus tenderness. Left sinus exhibits no maxillary sinus tenderness and no frontal sinus tenderness.  Mouth/Throat: Uvula is midline, oropharynx is clear and moist and mucous membranes are normal.  Eyes: Pupils are equal, round, and reactive to light. Conjunctivae, EOM and lids are normal. Lids are everted and swept, no foreign bodies found.  Neck: Trachea normal and normal range of motion. Neck supple. Carotid bruit is not present. No thyroid mass and no thyromegaly present.  Cardiovascular: Normal rate, regular rhythm, S1 normal, S2 normal, normal heart sounds, intact distal pulses and normal pulses.  Exam reveals no gallop and no friction rub.   No murmur heard. Pulmonary/Chest: Effort normal and breath sounds normal. No tachypnea. No respiratory distress. She has no decreased breath sounds. She has no wheezes. She has no rhonchi. She has no rales.  Abdominal: Soft. Normal appearance and bowel sounds are normal. There is no tenderness.  Musculoskeletal:       Right ankle: Normal.       Right foot: Normal. There is normal range of motion, no tenderness, no bony tenderness and no swelling.  Neurological: She is alert.  Skin: Skin is warm, dry and intact. No rash noted.  Psychiatric: Her speech is normal and behavior is normal. Judgment and thought content normal. Her mood appears not anxious. Cognition and memory are normal. She does not exhibit a depressed mood.  Assessment & Plan:

## 2017-05-17 NOTE — Assessment & Plan Note (Addendum)
Likely post infectious bronchospasm. Use cough suppressant prn.

## 2017-05-17 NOTE — Telephone Encounter (Signed)
Received page from [facility] regarding critical lab  Hgb 7.9 (baseline has previously been about 11 - 8 mo ago)  No overt bleeding by notes; has hx of CKD, HTN, and c/o burning to calves bilat with new onset leg cramping and fatigue, not found to be orthostatic or o/w hemodynamically unstable  A/P; Anemia, Normocytic of uncertain etiology         - ? possible but uncertain mild symptomatic anemia with leg complaints on ambulation, but o/w not orthostatic or unstable and no overt bleeding       -   Suspect likely relatively recent slowly evolving anemia process      - will notify PCP, will need further evaluation including iron panel and repeat CBC to further gauge chronicity, and consider transfusion for < 7 or worsening symptomatology  Dr [name] - to note above

## 2017-05-17 NOTE — Assessment & Plan Note (Signed)
Not clearly appearing like gout. Will check urin acid level. Previously controlled on allopurinol.

## 2017-05-17 NOTE — Assessment & Plan Note (Signed)
Eval with labs.  Hold statin  For 1 week as may be contributing. Restart if no improvement.  Increase water.

## 2017-05-17 NOTE — Patient Instructions (Signed)
Please stop at the lab to have labs drawn. If lab are negative..Hold liptor for 1 week.  Keep up with water intake to avoid dehydration. Use cough suppressant for cough.

## 2017-05-18 ENCOUNTER — Other Ambulatory Visit (INDEPENDENT_AMBULATORY_CARE_PROVIDER_SITE_OTHER): Payer: 59

## 2017-05-18 ENCOUNTER — Other Ambulatory Visit: Payer: Self-pay | Admitting: Family Medicine

## 2017-05-18 DIAGNOSIS — R7989 Other specified abnormal findings of blood chemistry: Secondary | ICD-10-CM

## 2017-05-18 DIAGNOSIS — R5383 Other fatigue: Secondary | ICD-10-CM | POA: Diagnosis not present

## 2017-05-18 LAB — IBC PANEL
IRON: 4 ug/dL — AB (ref 42–145)
SATURATION RATIOS: 0.8 % — AB (ref 20.0–50.0)
TRANSFERRIN: 358 mg/dL (ref 212.0–360.0)

## 2017-05-18 LAB — FERRITIN: FERRITIN: 4.1 ng/mL — AB (ref 10.0–291.0)

## 2017-05-18 NOTE — Telephone Encounter (Signed)
PLEASE NOTE: All timestamps contained within this report are represented as [place] Standard Time. CONFIDENTIALTY NOTICE: This fax transmission is intended only for the addressee. It contains information that is legally privileged, confidential or otherwise protected from use or disclosure. If you are not the intended recipient, you are strictly prohibited from reviewing, disclosing, copying using or disseminating any of this information or taking any action in reliance on or regarding this information. If you have received this fax in error, please notify us immediately by telephone so that we can arrange for its return to [place]. Phone: [phone], Toll-Free: [phone], Fax: [phone] Page: 1 of 1 Call Id: [id number] [place] Patient Name: Virginia Wilson Gender: Female DOB: [birth year] Age: 61 Y 10 M 4 D Return Phone Number: City/State/Zip: [place] [employer] [place] Night - Client Client Site [place] Physician Eliezer Lofts - MD Who Is Calling Lab Lab Name [facility] Lab Phone Number [phone] Lab Tech Name Santiago Glad Lab Reference Number Chief Complaint Lab Result (Critical or Stat) Call Type Lab Send to RN Reason for Call Report lab results Initial Comment Santiago Glad with [employer] [facility] Lab at [phone]. Caller has a critical value to report. Nurse Assessment Nurse: Cox, RN, [place] Date/Time (Eastern Time): 05/17/2017 5:25:08 PM Is there an on-call provider listed? ---Yes Please list name of person reporting value (Lab Employee) and a contact number. ---Santiago Glad [phone] Please document the following items: Lab name Lab value (read back to lab to verify) Reference range for lab value Date and time blood was drawn Collect time of birth for bilirubin results ---Hemoglobin 7.9 Collected today, 05/17/17 at 1:12 today,  received at 4:40 Please collect the patient contact information from the lab. (name, phone number and address) ---[phone] Guidelines Guideline Title Affirmed Question Disp. Time Eilene Ghazi Time) Disposition Final User 05/17/2017 5:59:27 PM Clinical Call Yes Cox, RN, [place] Phone DateTime Action Result/Outcome Notes Cathlean Cower- MD [phone] 05/17/2017 5:29:18 PM Doctor Paged Paged On Call Back to Call Center Cathlean Cower- MD 05/17/2017 5:58:32 PM Message Result Spoke with On Call - General MD informed of lab result. Verbalized understanding. No orders recieved.

## 2017-05-18 NOTE — Telephone Encounter (Signed)
Agreed -

## 2017-05-19 NOTE — Telephone Encounter (Signed)
Have pt schedule in next few weeks, CPX with labs prior.. Can re check hemoglobin then too.  Will refill

## 2017-05-19 NOTE — Telephone Encounter (Signed)
Last office visit 05/17/17.  Last CPE & Lipid 04/06/2016.  Refill?

## 2017-05-21 NOTE — Telephone Encounter (Signed)
Patient notified as instructed by telephone. Patient stated that she will schedule appointments when she stops by to pick up the stool cards.

## 2017-05-25 NOTE — Addendum Note (Signed)
Addended by: Ellamae Sia on: 05/25/2017 07:50 AM   Modules accepted: Orders

## 2017-05-28 ENCOUNTER — Telehealth: Payer: Self-pay | Admitting: Family Medicine

## 2017-05-28 ENCOUNTER — Other Ambulatory Visit: Payer: 59

## 2017-05-28 DIAGNOSIS — K921 Melena: Secondary | ICD-10-CM

## 2017-05-28 DIAGNOSIS — R7989 Other specified abnormal findings of blood chemistry: Secondary | ICD-10-CM

## 2017-05-28 LAB — HEMOCCULT SLIDES (X 3 CARDS)
Fecal Occult Blood: NEGATIVE
OCCULT 1: POSITIVE — AB
OCCULT 2: POSITIVE — AB
OCCULT 3: NEGATIVE
OCCULT 4: NEGATIVE
OCCULT 5: NEGATIVE

## 2017-05-28 NOTE — Telephone Encounter (Signed)
Virginia Wilson notified that results are not back yet.  She ask that we make a note that her stools are black and she vomited on Saturday and it was black also.

## 2017-05-28 NOTE — Telephone Encounter (Signed)
Pt called checking on results for the stool test Best number to call [phone]

## 2017-05-29 ENCOUNTER — Encounter: Payer: Self-pay | Admitting: Physician Assistant

## 2017-05-29 ENCOUNTER — Encounter: Payer: Self-pay | Admitting: Gastroenterology

## 2017-05-29 ENCOUNTER — Telehealth: Payer: Self-pay | Admitting: Family Medicine

## 2017-05-29 ENCOUNTER — Other Ambulatory Visit (INDEPENDENT_AMBULATORY_CARE_PROVIDER_SITE_OTHER): Payer: 59

## 2017-05-29 DIAGNOSIS — K921 Melena: Secondary | ICD-10-CM

## 2017-05-29 DIAGNOSIS — N289 Disorder of kidney and ureter, unspecified: Secondary | ICD-10-CM

## 2017-05-29 DIAGNOSIS — M79671 Pain in right foot: Secondary | ICD-10-CM | POA: Diagnosis not present

## 2017-05-29 LAB — CBC WITH DIFFERENTIAL/PLATELET
BASOS ABS: 0.1 10*3/uL (ref 0.0–0.1)
Basophils Relative: 0.7 % (ref 0.0–3.0)
EOS ABS: 0.2 10*3/uL (ref 0.0–0.7)
Eosinophils Relative: 2.3 % (ref 0.0–5.0)
HCT: 27.5 % — ABNORMAL LOW (ref 36.0–46.0)
LYMPHS ABS: 1.6 10*3/uL (ref 0.7–4.0)
Lymphocytes Relative: 19 % (ref 12.0–46.0)
MCHC: 30.7 g/dL (ref 30.0–36.0)
MCV: 86.1 fl (ref 78.0–100.0)
MONO ABS: 0.7 10*3/uL (ref 0.1–1.0)
Monocytes Relative: 8.4 % (ref 3.0–12.0)
NEUTROS PCT: 69.6 % (ref 43.0–77.0)
Neutro Abs: 5.8 10*3/uL (ref 1.4–7.7)
Platelets: 497 10*3/uL — ABNORMAL HIGH (ref 150.0–400.0)
RBC: 3.19 Mil/uL — AB (ref 3.87–5.11)
RDW: 18.3 % — ABNORMAL HIGH (ref 11.5–15.5)
WBC: 8.3 10*3/uL (ref 4.0–10.5)

## 2017-05-29 LAB — BASIC METABOLIC PANEL
BUN: 15 mg/dL (ref 6–23)
CO2: 29 mEq/L (ref 19–32)
CREATININE: 1.25 mg/dL — AB (ref 0.40–1.20)
Calcium: 9.2 mg/dL (ref 8.4–10.5)
Chloride: 107 mEq/L (ref 96–112)
GFR: 46.33 mL/min — AB (ref >60.00)
Glucose, Bld: 85 mg/dL (ref 70–99)
Potassium: 3.8 mEq/L (ref 3.5–5.1)
Sodium: 142 mEq/L (ref 135–145)

## 2017-05-29 LAB — URIC ACID: Uric Acid, Serum: 4.7 mg/dL (ref 2.4–7.0)

## 2017-05-29 NOTE — Telephone Encounter (Signed)
Notify pt that there was blood seen in her stool.   If further emesis or if she is dizzy or short of breath ...go to ER now, otherwise I will refer to GI ASAP AND Have her come ASAP for cbc stat..  Stop any aspirin, NSAIDS, blood thinners etc. ( none on her med list)

## 2017-05-29 NOTE — Telephone Encounter (Signed)
Lab results discussed with patient.  See result note from labs drawn 05/29/2017.

## 2017-05-29 NOTE — Telephone Encounter (Signed)
Virginia Wilson notified as instructed by telephone.  She denies anymore emesis or dizziness.  She does have SOB but that is not new.  Lab appointment scheduled for today at 10:30 am for STAT CBC.

## 2017-05-29 NOTE — Telephone Encounter (Signed)
Patient returned Donna's call. °

## 2017-05-31 ENCOUNTER — Encounter: Payer: Self-pay | Admitting: Family Medicine

## 2017-05-31 NOTE — Telephone Encounter (Signed)
Letter placed up front for Ms. Mruk to pick up.

## 2017-05-31 NOTE — Telephone Encounter (Signed)
Letter ready for pick up. Place at front as patient  Notified by MyChart.

## 2017-06-04 ENCOUNTER — Telehealth: Payer: Self-pay | Admitting: Family Medicine

## 2017-06-04 NOTE — Telephone Encounter (Signed)
Left message asking pt to call office  ? About FMLA

## 2017-06-05 ENCOUNTER — Encounter: Payer: Self-pay | Admitting: Family Medicine

## 2017-06-05 DIAGNOSIS — D509 Iron deficiency anemia, unspecified: Secondary | ICD-10-CM | POA: Insufficient documentation

## 2017-06-05 DIAGNOSIS — Z7689 Persons encountering health services in other specified circumstances: Secondary | ICD-10-CM

## 2017-06-05 NOTE — Telephone Encounter (Signed)
It is most appropriate if I do.

## 2017-06-05 NOTE — Telephone Encounter (Signed)
Paperwork in dr bedsole's in box

## 2017-06-05 NOTE — Telephone Encounter (Signed)
In outbox

## 2017-06-05 NOTE — Telephone Encounter (Signed)
Spoke with pt.  She stated she wanted this to start 06/12/17.  She has appointment with [facility] 06/12/17.  Do you want to fill out FMLA or see if  GI will.  Pt stated she would know how much or if any time she would need off until he talks to GI

## 2017-06-06 NOTE — Telephone Encounter (Signed)
Paperwork faxed Copy for file Copy for scan Copy for pt Copy for billing Left message letting pt know paperwork has been faxed and there is a copy here for her

## 2017-06-07 ENCOUNTER — Ambulatory Visit: Payer: 59 | Admitting: Gastroenterology

## 2017-06-12 ENCOUNTER — Ambulatory Visit (INDEPENDENT_AMBULATORY_CARE_PROVIDER_SITE_OTHER): Payer: 59 | Admitting: Physician Assistant

## 2017-06-12 ENCOUNTER — Encounter: Payer: Self-pay | Admitting: Physician Assistant

## 2017-06-12 ENCOUNTER — Telehealth: Payer: Self-pay

## 2017-06-12 ENCOUNTER — Other Ambulatory Visit (INDEPENDENT_AMBULATORY_CARE_PROVIDER_SITE_OTHER): Payer: 59

## 2017-06-12 VITALS — BP 126/80 | HR 92 | Ht 66.14 in | Wt 197.5 lb

## 2017-06-12 DIAGNOSIS — R0609 Other forms of dyspnea: Secondary | ICD-10-CM

## 2017-06-12 DIAGNOSIS — D508 Other iron deficiency anemias: Secondary | ICD-10-CM

## 2017-06-12 DIAGNOSIS — R5383 Other fatigue: Secondary | ICD-10-CM | POA: Diagnosis not present

## 2017-06-12 DIAGNOSIS — R195 Other fecal abnormalities: Secondary | ICD-10-CM

## 2017-06-12 DIAGNOSIS — R079 Chest pain, unspecified: Secondary | ICD-10-CM | POA: Diagnosis not present

## 2017-06-12 LAB — CBC WITH DIFFERENTIAL/PLATELET
Basophils Absolute: 0.1 10*3/uL (ref 0.0–0.1)
Basophils Relative: 1.1 % (ref 0.0–3.0)
EOS ABS: 0.2 10*3/uL (ref 0.0–0.7)
Eosinophils Relative: 2.3 % (ref 0.0–5.0)
HEMATOCRIT: 37.8 % (ref 36.0–46.0)
Hemoglobin: 11.7 g/dL — ABNORMAL LOW (ref 12.0–15.0)
LYMPHS ABS: 1.2 10*3/uL (ref 0.7–4.0)
LYMPHS PCT: 15.4 % (ref 12.0–46.0)
MCHC: 30.9 g/dL (ref 30.0–36.0)
MCV: 89.3 fl (ref 78.0–100.0)
MONOS PCT: 8.3 % (ref 3.0–12.0)
Monocytes Absolute: 0.6 10*3/uL (ref 0.1–1.0)
NEUTROS ABS: 5.6 10*3/uL (ref 1.4–7.7)
Neutrophils Relative %: 72.9 % (ref 43.0–77.0)
PLATELETS: 362 10*3/uL (ref 150.0–400.0)
RBC: 4.23 Mil/uL (ref 3.87–5.11)
RDW: 21.9 % — AB (ref 11.5–15.5)
WBC: 7.7 10*3/uL (ref 4.0–10.5)

## 2017-06-12 NOTE — Telephone Encounter (Signed)
Spoke with the patient. She says she has seen her results on the patient portal. She is pleased with the results. She will call tomorrow with an update after seeing the cardiologist.

## 2017-06-12 NOTE — Progress Notes (Signed)
Subjective:    Patient ID: Virginia Wilson, female    DOB: Oct 25, 1955, 61 y.o.   MRN: [id number]  HPI [name] is a pleasant 61 year old white female, new to GI today referred by Dr. [name] for evaluation of iron deficiency anemia and dark stools. Patient has history of hypertension, asthma and GERD. Review of her records show that she had an endoscopy done in 2000 for in [place] which did show gastric erosions on the she was H. pylori positive and appear she was treated. She has not had prior colonoscopy. Patient states that she has developed significant fatigue, shortness of breath with exertion and aching in her legs with exertion over the past couple of months. She is also having exertional chest pain. 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 she will get a pressure in her chest and she has to sit down to rest. She was seen by Dr. [name] and had labs done on 05/29/2017 showing hemoglobin 8.5 hematocrit of 27.5 MCV of 86, week or so prior to that hemoglobin was 7.9. Hemoglobin in December 2017 was 11.8. She has done Hemoccults 2 out of 3 were positive, and serum iron for TIBC 358's iron saturation of 0.8 and ferritin of 4.1 Patient says her stools have been consistently dark over the past month and she had 1 episode of vomiting a couple of weeks back that she says was very dark and blackish in color. Not occurred. She complains of abdominal bloating and discomfort and some mild mid to lower abdominal discomfort. Appetite has  been fair ,her weight is down a few pounds. She denies any heartburn indigestion or dysphagia. She had been taking quite a bit of Excedrin Migraine long-term for headaches which she stopped a couple of weeks ago. She is now on an iron supplement and Prilosec 40 mg by mouth daily. Family history is negative for GI disease far she is aware.  Review of Systems Pertinent positive and negative review of systems were noted in the above HPI section.  All other  review of systems was otherwise negative.  Outpatient Encounter Prescriptions as of 06/12/2017  Medication Sig  . allopurinol (ZYLOPRIM) 300 MG tablet TAKE 1 TABLET BY MOUTH  DAILY  . atorvastatin (LIPITOR) 40 MG tablet TAKE 1 TABLET BY MOUTH  DAILY  . betamethasone dipropionate (DIPROLENE) 0.05 % cream Apply topically 2 (two) times daily.  Marland Kitchen EPINEPHrine 0.3 mg/0.3 mL IJ SOAJ injection   . famotidine (PEPCID) 20 MG tablet Take 1 tablet (20 mg total) by mouth daily.  . ferrous sulfate 325 (65 FE) MG tablet Take 325 mg by mouth 3 (three) times daily with meals.  . fluocinonide (LIDEX) 0.05 % external solution Apply 1 application topically daily as needed.  Marland Kitchen levothyroxine (SYNTHROID, LEVOTHROID) 25 MCG tablet Take 1 tablet (25 mcg total) by mouth daily before breakfast.  . Omega-3 Fatty Acids (FISH OIL) 1200 MG CAPS Take 1 capsule by mouth daily.  Marland Kitchen omeprazole (PRILOSEC) 40 MG capsule Take 40 mg by mouth daily.  . promethazine-dextromethorphan (PROMETHAZINE-DM) 6.25-15 MG/5ML syrup   . [DISCONTINUED] lisinopril (PRINIVIL,ZESTRIL) 10 MG tablet Take 1 tablet (10 mg total) by mouth daily. (Patient not taking: Reported on 05/17/2017)  . [DISCONTINUED] omeprazole (PRILOSEC) 20 MG capsule Take 20 mg by mouth daily.     No facility-administered encounter medications on file as of 06/12/2017.    Allergies  Allergen Reactions  . Penicillins     Has patient had a PCN reaction  causing immediate rash, facial/tongue/throat swelling, SOB or lightheadedness with hypotension: Unknown Has patient had a PCN reaction causing severe rash involving mucus membranes or skin necrosis: Unknown Has patient had a PCN reaction that required hospitalization: no Has patient had a PCN reaction occurring within the last 10 years: No If all of the above answers are "NO", then may proceed with Cephalosporin use.   . Sulfonamide Derivatives     REACTION: unspecified   Patient Active Problem List   Diagnosis Date Noted  .  Anemia due to GI blood loss 06/05/2017  . Right foot pain 05/17/2017  . Leg cramps 05/17/2017  . Cough 05/17/2017  . Anaphylactic syndrome 04/29/2017  . Insomnia 08/22/2016  . Prediabetes 02/23/2015  . Chronic renal insufficiency, stage I 02/06/2014  . Gout with manifestations 02/23/2011  . PSORIASIS, SCALP 12/02/2010  . Hypothyroidism 05/22/2007  . HYPERCHOLESTEROLEMIA 01/21/2007  . Essential hypertension, benign 01/21/2007  . ALLERGIC RHINITIS 01/21/2007  . ASTHMA, CHILDHOOD 01/21/2007  . GERD 01/21/2007  . MIGRAINE, MENSTRUAL 01/21/2007  . MENOPAUSAL SYNDROME 01/21/2007   Social History   Social History  . Marital status: Widowed    Spouse name: N/A  . Number of children: 2  . Years of education: N/A   Occupational History  . 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 Smoking status: Never Smoker  . Smokeless tobacco: Never Used  . Alcohol use No  . Drug use: No  . Sexual activity: Not on file   Other Topics Concern  . Not on file   Social History Narrative  . No narrative on file    Ms. Rinke's family history includes Coronary artery disease in her father; Diabetes in her father and mother; Heart attack in her father; Hypertension in her brother, brother, and mother; Stroke in her mother.      Objective:    Vitals:   06/12/17 0952  BP: 126/80  Pulse: 92    Physical Exam  well-developed white female in no acute distress, pleasant accompanied by her daughter, blood pressure 126/80 pulse 92, height 5 foot 6, weight 197, BMI of 31.7. HEENT; nontraumatic normocephalic EOMI PERRLA sclera anicteric, Cardiovascular ;regular rate and rhythm with S1-S2 no murmur or gallop, Pulmonary ;clear bilaterally Abd; soft , she has some tenderness in the epigastrium and also in the right mid and right lower quadrant there is no guarding or rebound no palpable mass or hepatosplenomegaly bowel sounds are present, Rectal ;exam -recent Hemoccults positive,  extremities no clubbing cyanosis or edema skin warm and dry, Neuropsych; mood and affect appropriate       Assessment & Plan:   #43 61 year old female with new iron deficiency anemia, heme positive stool, and symptomatic with fatigue, exertional dyspnea dark stools and exertional chest pain over the past one to one and a half months. Patient has no previous cardiac history. Suspect anemia induced angina, rule out obstructive coronary disease Rule out gastric or colonic neoplasm/lesion, peptic ulcer disease  #2 history of hypertension #3 asthma #4 history of GERD  Plan; Patient needs cardiac evaluation, we have arranged for cardiac consultation later this week.; Continue omeprazole 40 mg by mouth every morning No aspirin or NSAIDs Repeat CBC today Continue ferrous sulfate 325 mg by mouth 3 times a day Patient will be scheduled for EGD and colonoscopy with Dr. Havery Moros. We tentatively have this scheduled for September 17 but will  need cardiac clearance prior to sedation. All the above plans were discussed in detail with  the patient and her daughter today, procedures discussed in detail including risks and benefits and she is agreeable to proceed.   Amy S Esterwood PA-C 06/12/2017   Cc: Jinny Sanders, MD

## 2017-06-12 NOTE — Telephone Encounter (Signed)
-----   Message from Virginia Ferguson, PA-C sent at 06/12/2017 12:30 PM EDT ----- Please let pt know her hgb is much better at 11 .7 - she has appt with cardiology tomorrow and is to call us after that appt to let us know their plan. Tell her we have scheduled her for EGD and Colon with DR Havery Moros on 9/17 at 10 am  She will need prep instructions etc  Which you can do or ask Pam to do . I wanted to know cardiology plan before we went over everything..she has been having exertional chest pain  With her anemia

## 2017-06-12 NOTE — Progress Notes (Signed)
Agree with assessment and plan as outlined,  

## 2017-06-12 NOTE — Patient Instructions (Addendum)
Please go to the basement level to have your labs drawn.  Continue Prilosec 40 mg, take 1 tablet by mouth every morning.  No aspirin or NSAIDS ( Ibuprofen,  Advil, Motren, ).   You have an appointment with [facility], Dr. Agustin Cree.  [address] [place] [address] Phone [phone].  Time 10:00 am, arrive at 9:45 am. Bring your insurance cards, medication list and co-pay.   Call us after your cardiology appointment, ask to speak with Amy's nurse Altha.  We will decide about the colonoscopy and endoscopy at a later time.

## 2017-06-13 ENCOUNTER — Encounter: Payer: Self-pay | Admitting: Cardiology

## 2017-06-13 ENCOUNTER — Ambulatory Visit (INDEPENDENT_AMBULATORY_CARE_PROVIDER_SITE_OTHER): Payer: 59 | Admitting: Cardiology

## 2017-06-13 VITALS — BP 118/76 | HR 64 | Resp 12 | Ht 66.5 in | Wt 198.8 lb

## 2017-06-13 DIAGNOSIS — I209 Angina pectoris, unspecified: Secondary | ICD-10-CM

## 2017-06-13 DIAGNOSIS — D500 Iron deficiency anemia secondary to blood loss (chronic): Secondary | ICD-10-CM

## 2017-06-13 DIAGNOSIS — R079 Chest pain, unspecified: Secondary | ICD-10-CM | POA: Diagnosis not present

## 2017-06-13 DIAGNOSIS — I10 Essential (primary) hypertension: Secondary | ICD-10-CM | POA: Diagnosis not present

## 2017-06-13 DIAGNOSIS — E7800 Pure hypercholesterolemia, unspecified: Secondary | ICD-10-CM

## 2017-06-13 MED ORDER — RANOLAZINE ER 500 MG PO TB12: 500.0000 mg | ORAL_TABLET | Freq: Two times a day (BID) | ORAL | 11 refills | Status: DC

## 2017-06-13 MED ORDER — NITROGLYCERIN 0.4 MG SL SUBL: 0.4000 mg | SUBLINGUAL_TABLET | SUBLINGUAL | 1 refills | Status: AC | PRN

## 2017-06-13 NOTE — Patient Instructions (Signed)
Medication Instructions:  Your physician has recommended you make the following change in your medication:  1.) START Ranexa 500 mg twice daily.  2.) START Nitroglycerin tablets 0.4 mg as needed. Place under the tongue. The proper use and anticipated side effects of nitroglycerine has been carefully explained.  If a single episode of chest pain is not relieved by one tablet, the patient will try another within 5 minutes; and if this doesn't relieve the pain, the patient is instructed to call 911 for transportation to an emergency department.  Labwork: None   Testing/Procedures: Your physician has requested that you have en exercise stress myoview. For further information please visit [email or web address]. Please follow instruction sheet, as given.   Follow-Up: Your physician recommends that you schedule a follow-up appointment in: 3 weeks   Any Other Special Instructions Will Be Listed Below (If Applicable).  Please note that any paperwork needing to be filled out by the provider will need to be addressed at the front desk prior to seeing the provider. Please note that any paperwork FMLA, Disability or other documents regarding health condition is subject to a $25.00 charge that must be received prior to completion of paperwork in the form of a money order or check.     If you need a refill on your cardiac medications before your next appointment, please call your pharmacy.

## 2017-06-13 NOTE — Telephone Encounter (Signed)
See the cardiology note. The patient has been started on new medications and will have a stress test. She agrees to make an appointment for the pre-visit and keep the procedure date. Understands if the results of the stress test indicates she cannot safely have her procedures at the [facility], she will be rescheduled.

## 2017-06-13 NOTE — Progress Notes (Signed)
Cardiology Consultation:    Date:  06/13/2017   ID:  Virginia Wilson, DOB April 24, 1956, MRN [id number]  PCP:  Jinny Sanders, MD  Cardiologist:  [name], MD   Referring MD: Jinny Sanders, MD   Chief Complaint  Patient presents with  . Chest Pain  I'm having chest pain  History of Present Illness:    Virginia Wilson is a 61 y.o. female who is being seen today for the evaluation of Chest pain at the request of Virginia Wilson, Amy E, MD. Patient is a 58 years a woman that for last few months being experiencing exertional tightness in the chest. Virginia Wilson it is predictable anytime she will try to do some effort tightness will happen she stops and minutes later sensation goes away it is associated with some shortness of breath as well as some sweating. Recently she was diagnosed with significant anemia she actually so gastroenterologist will refer her to assess. Her hemoglobin dropped below 8 luckily no blood transfusions be needed. She has still guaiac positive for blood. Overall symptoms of chest pain and not getting significantly worse it appears to be stable pattern. She does have some family history of premature coronary artery disease, she does have dyslipidemia but is successfully managed with statin, there is some borderline hypertension as well as borderline diabetes. She never smoked.  Past Medical History:  Diagnosis Date  . Allergic rhinitis, cause unspecified   . Anemia   . Chronic headaches   . Chronic kidney disease, unspecified   . Esophageal reflux   . Extrinsic asthma, unspecified   . Family history of ischemic heart disease   . Obesity, unspecified   . Premenstrual tension syndromes   . Pure hypercholesterolemia   . Symptomatic menopausal or female climacteric states   . Unspecified essential hypertension   . Unspecified hypothyroidism     Past Surgical History:  Procedure Laterality Date  . ovarian repair Left   . TONSILLECTOMY      Current Medications: Current Meds    Medication Sig  . allopurinol (ZYLOPRIM) 300 MG tablet TAKE 1 TABLET BY MOUTH  DAILY  . atorvastatin (LIPITOR) 40 MG tablet TAKE 1 TABLET BY MOUTH  DAILY  . betamethasone dipropionate (DIPROLENE) 0.05 % cream Apply topically 2 (two) times daily.  Marland Kitchen EPINEPHrine 0.3 mg/0.3 mL IJ SOAJ injection   . famotidine (PEPCID) 20 MG tablet Take 1 tablet (20 mg total) by mouth daily.  . ferrous sulfate 325 (65 FE) MG tablet Take 325 mg by mouth 3 (three) times daily with meals.  . fluocinonide (LIDEX) 0.05 % external solution Apply 1 application topically daily as needed.  Marland Kitchen levothyroxine (SYNTHROID, LEVOTHROID) 25 MCG tablet Take 1 tablet (25 mcg total) by mouth daily before breakfast.  . Omega-3 Fatty Acids (FISH OIL) 1200 MG CAPS Take 1 capsule by mouth daily.  Marland Kitchen omeprazole (PRILOSEC) 40 MG capsule Take 40 mg by mouth daily.  . promethazine-dextromethorphan (PROMETHAZINE-DM) 6.25-15 MG/5ML syrup      Allergies:   Penicillins and Sulfonamide derivatives   Social History   Social History  . Marital status: Widowed    Spouse name: N/A  . Number of children: 2  . Years of education: N/A   Occupational History  . 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 Smoking status: Never Smoker  . Smokeless tobacco: Never Used  . Alcohol use No  . Drug use: No  . Sexual activity: Not Asked   Other Topics  Concern  . None   Social History Narrative  . None     Family History: The patient's family history includes Coronary artery disease in her father; Diabetes in her father and mother; Heart attack in her father; Hypertension in her brother, brother, and mother; Stroke in her mother. ROS:   Please see the history of present illness.    All 14 point review of systems negative except as described per history of present illness.  EKGs/Labs/Other Studies Reviewed:      Recent Labs: 08/22/2016: ALT 18 05/17/2017: TSH 2.06 05/29/2017: BUN 15; Creatinine, Ser 1.25; Potassium  3.8; Sodium 142 06/12/2017: Hemoglobin 11.7; Platelets 362.0  Recent Lipid Panel    Component Value Date/Time   CHOL 123 04/04/2016 0828   TRIG 118.0 04/04/2016 0828   TRIG 294 (H) 05/12/2010 0819   HDL 46.00 04/04/2016 0828   CHOLHDL 3 04/04/2016 0828   VLDL 23.6 04/04/2016 0828   LDLCALC 54 04/04/2016 0828   LDLDIRECT 128.4 06/27/2011 1317    Physical Exam:    VS:  BP 118/76   Pulse 64   Resp 12   Ht 5' 6.5" (1.689 m)   Wt 198 lb 12.8 oz (90.2 kg)   BMI 31.61 kg/m     Wt Readings from Last 3 Encounters:  06/13/17 198 lb 12.8 oz (90.2 kg)  06/12/17 197 lb 8 oz (89.6 kg)  05/17/17 201 lb (91.2 kg)     GEN:  Well nourished, well developed in no acute distress HEENT: Normal NECK: No JVD; No carotid bruits LYMPHATICS: No lymphadenopathy CARDIAC: RRR, no murmurs, no rubs, no gallops RESPIRATORY:  Clear to auscultation without rales, wheezing or rhonchi  ABDOMEN: Soft, non-tender, non-distended MUSCULOSKELETAL:  No edema; No deformity  SKIN: Warm and dry NEUROLOGIC:  Alert and oriented x 3 PSYCHIATRIC:  Normal affect   ASSESSMENT:    1. Angina pectoris ([place])   2. Essential hypertension, benign   3. Anemia due to GI blood loss   4. HYPERCHOLESTEROLEMIA    PLAN:    In order of problems listed above:  1. Angina pectoris: Fairly typical description she gave me. Of this he still wishes quite complicated. She could be accounted different cardiac catheterization however with her GI bleeding that is ongoing I don't think it's a good option since we will not be able to do any stenting up with anticoagulation. I think we need to get some more data about her problem and in this situation stress test in my. Will be the best approach. We'll ask her to have EKG today: She is not on aspirin because of GI bleed: I will ask her to start taking her ranolazine 500 mg twice daily and she will be scheduled to have a stress test again stressed this will be trying to assess her symptoms that  could be simply related to significant anemia as well as if this truly ischemia to determine extent of the problem. If her stress test will be normal will proceed with gastroscopy, a for stressed this will show only small abnormality will proceed with gastroscopy. 2. Essential hypertension: Well-controlled with medications will continue. 3. Anemia scheduled to have gastroscopy will do stress test before. 4. His lipidemia: On statin therapy   Medication Adjustments/Labs and Tests Ordered: Current medicines are reviewed at length with the patient today.  Concerns regarding medicines are outlined above.  No orders of the defined types were placed in this encounter.  No orders of the defined types were placed in this  encounter.   Signed, Park Liter, MD, [facility]. 06/13/2017 10:46 AM    [place]

## 2017-06-14 ENCOUNTER — Telehealth (HOSPITAL_COMMUNITY): Payer: Self-pay | Admitting: *Deleted

## 2017-06-14 NOTE — Telephone Encounter (Signed)
Understands if the results of the stress test indicates she cannot safely have her procedures at the [facility], she will be rescheduled. We discussed this at length.

## 2017-06-14 NOTE — Telephone Encounter (Signed)
Virginia Wilson - looks like stress test scheduled for 9/11- we will need results   Prior to going ahead with Colon

## 2017-06-14 NOTE — Telephone Encounter (Signed)
Left message on voicemail per DPR in reference to upcoming appointment scheduled on 06/19/17 with detailed instructions given per Myocardial Perfusion Study Information Sheet for the test. LM to arrive 15 minutes early, and that it is imperative to arrive on time for appointment to keep from having the test rescheduled. If you need to cancel or reschedule your appointment, please call the office within 24 hours of your appointment. Failure to do so may result in a cancellation of your appointment, and a $50 no show fee. Phone number given for call back for any questions. Virginia Wilson, [name]    

## 2017-06-19 ENCOUNTER — Ambulatory Visit (HOSPITAL_COMMUNITY): Payer: 59 | Attending: Cardiology

## 2017-06-19 ENCOUNTER — Telehealth: Payer: Self-pay | Admitting: *Deleted

## 2017-06-19 DIAGNOSIS — R079 Chest pain, unspecified: Secondary | ICD-10-CM | POA: Diagnosis not present

## 2017-06-19 LAB — MYOCARDIAL PERFUSION IMAGING
CHL CUP MPHR: 160 {beats}/min
CHL CUP NUCLEAR SRS: 7
CHL CUP NUCLEAR SSS: 12
CHL CUP RESTING HR STRESS: 71 {beats}/min
CHL RATE OF PERCEIVED EXERTION: 19
CSEPED: 5 min
CSEPEDS: 26 s
CSEPEW: 7 METS
LV dias vol: 87 mL (ref 46–106)
LV sys vol: 33 mL
Peak HR: 144 {beats}/min
Percent HR: 90 %
RATE: 0.32
SDS: 5
TID: 1.05

## 2017-06-19 MED ORDER — TECHNETIUM TC 99M TETROFOSMIN IV KIT
10.2000 | PACK | Freq: Once | INTRAVENOUS | Status: AC | PRN
  Administered 2017-06-19: 10.2 via INTRAVENOUS
  Filled 2017-06-19: qty 11

## 2017-06-19 MED ORDER — TECHNETIUM TC 99M TETROFOSMIN IV KIT
32.5000 | PACK | Freq: Once | INTRAVENOUS | Status: AC | PRN
  Administered 2017-06-19: 32.5 via INTRAVENOUS
  Filled 2017-06-19: qty 33

## 2017-06-19 NOTE — Progress Notes (Signed)
Pt had stress test 06/19/17 with normal results. Per Dr. Havery Moros ok to proceed with colonoscopy

## 2017-06-19 NOTE — Telephone Encounter (Signed)
Okay, yes let's proceed as planned, thanks for letting me know

## 2017-06-19 NOTE — Telephone Encounter (Signed)
Dr. Havery Moros,  This patient completed her stress test and it appears the study was normal. Just an FYI, as we discussed you stated that if it was normal to proceed with colonoscopy.  Thank you. Olivia Mackie

## 2017-06-20 ENCOUNTER — Ambulatory Visit (AMBULATORY_SURGERY_CENTER): Payer: Self-pay

## 2017-06-20 ENCOUNTER — Telehealth: Payer: Self-pay | Admitting: *Deleted

## 2017-06-20 ENCOUNTER — Other Ambulatory Visit (INDEPENDENT_AMBULATORY_CARE_PROVIDER_SITE_OTHER): Payer: 59

## 2017-06-20 ENCOUNTER — Telehealth (INDEPENDENT_AMBULATORY_CARE_PROVIDER_SITE_OTHER): Payer: 59 | Admitting: Family Medicine

## 2017-06-20 VITALS — Ht 66.5 in | Wt 198.8 lb

## 2017-06-20 DIAGNOSIS — M109 Gout, unspecified: Secondary | ICD-10-CM

## 2017-06-20 DIAGNOSIS — N181 Chronic kidney disease, stage 1: Secondary | ICD-10-CM

## 2017-06-20 DIAGNOSIS — E7800 Pure hypercholesterolemia, unspecified: Secondary | ICD-10-CM

## 2017-06-20 DIAGNOSIS — D500 Iron deficiency anemia secondary to blood loss (chronic): Secondary | ICD-10-CM | POA: Diagnosis not present

## 2017-06-20 DIAGNOSIS — D509 Iron deficiency anemia, unspecified: Secondary | ICD-10-CM

## 2017-06-20 DIAGNOSIS — N2889 Other specified disorders of kidney and ureter: Secondary | ICD-10-CM

## 2017-06-20 DIAGNOSIS — R7303 Prediabetes: Secondary | ICD-10-CM

## 2017-06-20 DIAGNOSIS — E038 Other specified hypothyroidism: Secondary | ICD-10-CM

## 2017-06-20 DIAGNOSIS — R195 Other fecal abnormalities: Secondary | ICD-10-CM

## 2017-06-20 LAB — COMPREHENSIVE METABOLIC PANEL
ALBUMIN: 4.3 g/dL (ref 3.5–5.2)
ALK PHOS: 74 U/L (ref 39–117)
ALT: 9 U/L (ref 0–35)
AST: 15 U/L (ref 0–37)
BILIRUBIN TOTAL: 0.4 mg/dL (ref 0.2–1.2)
BUN: 18 mg/dL (ref 6–23)
CO2: 27 mEq/L (ref 19–32)
CREATININE: 1.35 mg/dL — AB (ref 0.40–1.20)
Calcium: 9.6 mg/dL (ref 8.4–10.5)
Chloride: 103 mEq/L (ref 96–112)
GFR: 42.38 mL/min — ABNORMAL LOW (ref >60.00)
Glucose, Bld: 105 mg/dL — ABNORMAL HIGH (ref 70–99)
POTASSIUM: 4.2 meq/L (ref 3.5–5.1)
Sodium: 139 mEq/L (ref 135–145)
TOTAL PROTEIN: 6.2 g/dL (ref 6.0–8.3)

## 2017-06-20 LAB — CBC WITH DIFFERENTIAL/PLATELET
Basophils Absolute: 0 10*3/uL (ref 0.0–0.1)
Basophils Relative: 0.6 % (ref 0.0–3.0)
EOS ABS: 0.2 10*3/uL (ref 0.0–0.7)
EOS PCT: 2.7 % (ref 0.0–5.0)
HCT: 38 % (ref 36.0–46.0)
HEMOGLOBIN: 11.8 g/dL — AB (ref 12.0–15.0)
LYMPHS ABS: 1.3 10*3/uL (ref 0.7–4.0)
Lymphocytes Relative: 23 % (ref 12.0–46.0)
MCHC: 31.1 g/dL (ref 30.0–36.0)
MCV: 89.4 fl (ref 78.0–100.0)
MONO ABS: 0.5 10*3/uL (ref 0.1–1.0)
MONOS PCT: 8.5 % (ref 3.0–12.0)
Neutro Abs: 3.8 10*3/uL (ref 1.4–7.7)
Neutrophils Relative %: 65.2 % (ref 43.0–77.0)
Platelets: 319 10*3/uL (ref 150.0–400.0)
RBC: 4.25 Mil/uL (ref 3.87–5.11)
RDW: 21.3 % — AB (ref 11.5–15.5)
WBC: 5.9 10*3/uL (ref 4.0–10.5)

## 2017-06-20 LAB — LIPID PANEL
CHOLESTEROL: 116 mg/dL (ref 0–200)
HDL: 54.7 mg/dL (ref >39.00)
LDL Cholesterol: 31 mg/dL (ref 0–99)
NonHDL: 61.54
Total CHOL/HDL Ratio: 2
Triglycerides: 151 mg/dL — ABNORMAL HIGH (ref 0.0–149.0)
VLDL: 30.2 mg/dL (ref 0.0–40.0)

## 2017-06-20 LAB — HEMOGLOBIN A1C: HEMOGLOBIN A1C: 5 % (ref 4.6–6.5)

## 2017-06-20 MED ORDER — NA SULFATE-K SULFATE-MG SULF 17.5-3.13-1.6 GM/177ML PO SOLN: ORAL | 0 refills | Status: DC

## 2017-06-20 NOTE — Telephone Encounter (Signed)
The patient saw Nicoletta Ba PA on 06-12-2017. We scheduled the patient for an Endo/colonoscopy on 06-25-2017 with Dr. Havery Moros. We referred her to [facility] The patient had a stress test with her cardiologist on 06-19-2017.  The test results were normal. Dr Havery Moros said the patient can proceed with having the EGD and Colonoscopy.

## 2017-06-20 NOTE — Telephone Encounter (Signed)
-----   Message from Ellamae Sia sent at 06/15/2017 11:31 AM EDT ----- Regarding: Lab orders for Wednesday, 9.12.18 Patient is scheduled for CPX labs, please order future labs, Thanks , Karna Christmas

## 2017-06-20 NOTE — Progress Notes (Signed)
Per pt, no allergies to soy or egg products.Pt not taking any weight loss meds or using  O2 at home.   Pt refused Emmi video. 

## 2017-06-25 ENCOUNTER — Ambulatory Visit (AMBULATORY_SURGERY_CENTER): Payer: 59 | Admitting: Gastroenterology

## 2017-06-25 ENCOUNTER — Encounter: Payer: Self-pay | Admitting: Gastroenterology

## 2017-06-25 VITALS — BP 133/80 | HR 61 | Temp 97.1°F | Resp 18 | Ht 66.5 in | Wt 198.0 lb

## 2017-06-25 DIAGNOSIS — D122 Benign neoplasm of ascending colon: Secondary | ICD-10-CM

## 2017-06-25 DIAGNOSIS — R195 Other fecal abnormalities: Secondary | ICD-10-CM | POA: Diagnosis not present

## 2017-06-25 DIAGNOSIS — D123 Benign neoplasm of transverse colon: Secondary | ICD-10-CM | POA: Diagnosis not present

## 2017-06-25 DIAGNOSIS — D508 Other iron deficiency anemias: Secondary | ICD-10-CM | POA: Diagnosis present

## 2017-06-25 MED ORDER — SODIUM CHLORIDE 0.9 % IV SOLN: 500.0000 mL | INTRAVENOUS | Status: DC

## 2017-06-25 NOTE — Progress Notes (Signed)
To recovery, report to RN, VSS. 

## 2017-06-25 NOTE — Patient Instructions (Signed)
YOU HAD AN ENDOSCOPIC PROCEDURE TODAY AT [facility]:   Refer to the procedure report that was given to you for any specific questions about what was found during the examination.  If the procedure report does not answer your questions, please call your gastroenterologist to clarify.  If you requested that your care partner not be given the details of your procedure findings, then the procedure report has been included in a sealed envelope for you to review at your convenience later.  YOU SHOULD EXPECT: Some feelings of bloating in the abdomen. Passage of more gas than usual.  Walking can help get rid of the air that was put into your GI tract during the procedure and reduce the bloating. If you had a lower endoscopy (such as a colonoscopy or flexible sigmoidoscopy) you may notice spotting of blood in your stool or on the toilet paper. If you underwent a bowel prep for your procedure, you may not have a normal bowel movement for a few days.  Please Note:  You might notice some irritation and congestion in your nose or some drainage.  This is from the oxygen used during your procedure.  There is no need for concern and it should clear up in a day or so.  SYMPTOMS TO REPORT IMMEDIATELY:   Following lower endoscopy (colonoscopy or flexible sigmoidoscopy):  Excessive amounts of blood in the stool  Significant tenderness or worsening of abdominal pains  Swelling of the abdomen that is new, acute  Fever of 100F or higher   Following upper endoscopy (EGD)  Vomiting of blood or coffee ground material  New chest pain or pain under the shoulder blades  Painful or persistently difficult swallowing  New shortness of breath  Fever of 100F or higher  Black, tarry-looking stools  For urgent or emergent issues, a gastroenterologist can be reached at any hour by calling [phone].   DIET:  We do recommend a small meal at first, but then you may proceed to your regular diet.  Drink  plenty of fluids but you should avoid alcoholic beverages for 24 hours.  ACTIVITY:  You should plan to take it easy for the rest of today and you should NOT DRIVE or use heavy machinery until tomorrow (because of the sedation medicines used during the test).    FOLLOW UP: Our staff will call the number listed on your records the next business day following your procedure to check on you and address any questions or concerns that you may have regarding the information given to you following your procedure. If we do not reach you, we will leave a message.  However, if you are feeling well and you are not experiencing any problems, there is no need to return our call.  We will assume that you have returned to your regular daily activities without incident.  If any biopsies were taken you will be contacted by phone or by letter within the next 1-3 weeks.  Please call us at [phone] if you have not heard about the biopsies in 3 weeks.    SIGNATURES/CONFIDENTIALITY: You and/or your care partner have signed paperwork which will be entered into your electronic medical record.  These signatures attest to the fact that that the information above on your After Visit Summary has been reviewed and is understood.  Full responsibility of the confidentiality of this discharge information lies with you and/or your care-partner.    Handouts were given to your care partner on a  hiatal hernia, polyps, diverticulosis, and hemorrhoids. NO ASPIRIN, ASPIRIN CONTAINING PRODUCTS (BC OR GOODY POWDERS) OR NSAIDS (IBUPROFEN, ADVIL, ALEVE, AND MOTRIN) FOR 14 days; TYLENOL IS OK TO TAKE. You may resume your other current medications today. Await biopsy results. Consider capsule endoscopy pending pathology results. Please call if any questions or concerns.

## 2017-06-25 NOTE — Progress Notes (Signed)
Called to room to assist during endoscopic procedure.  Patient ID and intended procedure confirmed with present staff. Received instructions for my participation in the procedure from the performing physician.  

## 2017-06-25 NOTE — Progress Notes (Signed)
No problems noted in the recovery room. maw 

## 2017-06-25 NOTE — Op Note (Signed)
[facility] Patient Name: Virginia Wilson Procedure Date: 06/25/2017 10:06 AM MRN: [id number] Endoscopist: Remo Lipps P. Armbruster MD, MD Age: 61 Referring MD:  Date of Birth: Apr 19, 1956 Gender: Female Account #: [id number] Procedure:                Colonoscopy Indications:              This is the patient's first colonoscopy, Iron                            deficiency anemia Medicines:                Monitored Anesthesia Care Procedure:                Pre-Anesthesia Assessment:                           - Prior to the procedure, a History and Physical                            was performed, and patient medications and                            allergies were reviewed. The patient's tolerance of                            previous anesthesia was also reviewed. The risks                            and benefits of the procedure and the sedation                            options and risks were discussed with the patient.                            All questions were answered, and informed consent                            was obtained. Prior Anticoagulants: The patient has                            taken no previous anticoagulant or antiplatelet                            agents. ASA Grade Assessment: III - A patient with                            severe systemic disease. After reviewing the risks                            and benefits, the patient was deemed in                            satisfactory condition to undergo the procedure.  After obtaining informed consent, the colonoscope                            was passed under direct vision. Throughout the                            procedure, the patient's blood pressure, pulse, and                            oxygen saturations were monitored continuously. The                            Colonoscope was introduced through the anus and                            advanced to the the cecum, identified  by                            appendiceal orifice and ileocecal valve. The                            colonoscopy was performed without difficulty. The                            patient tolerated the procedure well. The quality                            of the bowel preparation was good. The ileocecal                            valve, appendiceal orifice, and rectum were                            photographed. Scope In: 10:21:34 AM Scope Out: 10:41:02 AM Scope Withdrawal Time: 0 hours 13 minutes 2 seconds  Total Procedure Duration: 0 hours 19 minutes 28 seconds  Findings:                 The perianal and digital rectal examinations were                            normal.                           Many large-mouthed diverticula were found in the                            sigmoid colon.                           A 4 mm polyp was found in the transverse colon. The                            polyp was sessile. The polyp was removed with a  cold snare. Resection and retrieval were complete.                           A 5 mm polyp was found in the ascending colon. The                            polyp was sessile. The polyp was removed with a                            cold snare. Resection and retrieval were complete.                           Internal hemorrhoids were found during retroflexion.                           The exam was otherwise without abnormality. IC                            valve was quitte angulated and was not intubated                            following multiple attempts. Complications:            No immediate complications. Estimated blood loss:                            Minimal. Estimated Blood Loss:     Estimated blood loss was minimal. Impression:               - Diverticulosis in the sigmoid colon.                           - One 4 mm polyp in the transverse colon, removed                            with a cold snare. Resected and  retrieved.                           - One 5 mm polyp in the ascending colon, removed                            with a cold snare. Resected and retrieved.                           - Internal hemorrhoids.                           - The examination was otherwise normal.                           No cause for iron deficiency on colonoscopy. Recommendation:           - Patient has a contact number available for  emergencies. The signs and symptoms of potential                            delayed complications were discussed with the                            patient. Return to normal activities tomorrow.                            Written discharge instructions were provided to the                            patient.                           - Resume previous diet.                           - Continue present medications.                           - Await pathology results.                           - Repeat colonoscopy is recommended for                            surveillance. The colonoscopy date will be                            determined after pathology results from today's                            exam become available for review.                           - No ibuprofen, naproxen, or other non-steroidal                            anti-inflammatory drugs for 2 weeks after polyp                            removal.                           - Consideration for capsule endoscopy pending                            pathology results from EGD [place]. Armbruster MD, MD 06/25/2017 10:47:06 AM This report has been signed electronically.

## 2017-06-25 NOTE — Progress Notes (Signed)
Pt's states no medical or surgical changes since previsit or office visit. 

## 2017-06-25 NOTE — Op Note (Signed)
[facility] Patient Name: Virginia Wilson Procedure Date: 06/25/2017 10:06 AM MRN: [id number] Endoscopist: Remo Lipps P. [name] MD, MD Age: 61 Referring MD:  Date of Birth: 1956/08/26 Gender: Female Account #: [id number] Procedure:                Upper GI endoscopy Indications:              Iron deficiency anemia Medicines:                Monitored Anesthesia Care Procedure:                Pre-Anesthesia Assessment:                           - Prior to the procedure, a History and Physical                            was performed, and patient medications and                            allergies were reviewed. The patient's tolerance of                            previous anesthesia was also reviewed. The risks                            and benefits of the procedure and the sedation                            options and risks were discussed with the patient.                            All questions were answered, and informed consent                            was obtained. Prior Anticoagulants: The patient has                            taken no previous anticoagulant or antiplatelet                            agents. ASA Grade Assessment: III - A patient with                            severe systemic disease. After reviewing the risks                            and benefits, the patient was deemed in                            satisfactory condition to undergo the procedure.                           After obtaining informed consent, the endoscope was  passed under direct vision. Throughout the                            procedure, the patient's blood pressure, pulse, and                            oxygen saturations were monitored continuously. The                            Endoscope was introduced through the mouth, and                            advanced to the second part of duodenum. The upper                            GI endoscopy was  accomplished without difficulty.                            The patient tolerated the procedure well. Scope In: Scope Out: Findings:                 Esophagogastric landmarks were identified: the                            Z-line was found at 32 cm, the gastroesophageal                            junction was found at 32 cm and the upper extent of                            the gastric folds was found at 40 cm from the                            incisors.                           An 8 cm hiatal hernia was present.                           The exam of the esophagus was otherwise normal.                           The entire examined stomach was normal. Biopsies                            were taken from the antrum, body, and incisura with                            a cold forceps biopsy for H pylori testing given                            iron deficiency.  Patchy mildly erythematous mucosa was found in the                            duodenal bulb. Biopsies for histology were taken                            with a cold forceps for evaluation of celiac                            disease.                           The exam of the duodenum was otherwise normal. Complications:            No immediate complications. Estimated blood loss:                            Minimal. Estimated Blood Loss:     Estimated blood loss was minimal. Impression:               - Esophagogastric landmarks identified.                           - 8 cm hiatal hernia.                           - Normal stomach. Biopsies taken to rule out H                            pylori.                           - Erythematous duodenopathy. Biopsied.                           No overt cause for anemia on this exam, await                            pathology results. Recommendation:           - Patient has a contact number available for                            emergencies. The signs and symptoms of  potential                            delayed complications were discussed with the                            patient. Return to normal activities tomorrow.                            Written discharge instructions were provided to the                            patient.                           -  Resume previous diet.                           - Continue present medications.                           - Await pathology results.                           - Consideration for capsule endoscopy pending                            pathology results Remo Lipps P. Barbie Croston MD, MD 06/25/2017 10:49:48 AM This report has been signed electronically.

## 2017-06-26 ENCOUNTER — Telehealth: Payer: Self-pay

## 2017-06-26 ENCOUNTER — Encounter: Payer: Self-pay | Admitting: Family Medicine

## 2017-06-26 ENCOUNTER — Ambulatory Visit (INDEPENDENT_AMBULATORY_CARE_PROVIDER_SITE_OTHER): Payer: 59 | Admitting: Family Medicine

## 2017-06-26 VITALS — BP 116/82 | HR 72 | Temp 98.3°F | Ht 66.25 in | Wt 197.0 lb

## 2017-06-26 DIAGNOSIS — R079 Chest pain, unspecified: Secondary | ICD-10-CM

## 2017-06-26 DIAGNOSIS — M109 Gout, unspecified: Secondary | ICD-10-CM | POA: Diagnosis not present

## 2017-06-26 DIAGNOSIS — I10 Essential (primary) hypertension: Secondary | ICD-10-CM | POA: Diagnosis not present

## 2017-06-26 DIAGNOSIS — R7303 Prediabetes: Secondary | ICD-10-CM | POA: Diagnosis not present

## 2017-06-26 DIAGNOSIS — R1031 Right lower quadrant pain: Secondary | ICD-10-CM | POA: Diagnosis not present

## 2017-06-26 DIAGNOSIS — D509 Iron deficiency anemia, unspecified: Secondary | ICD-10-CM

## 2017-06-26 DIAGNOSIS — E7800 Pure hypercholesterolemia, unspecified: Secondary | ICD-10-CM

## 2017-06-26 DIAGNOSIS — E038 Other specified hypothyroidism: Secondary | ICD-10-CM

## 2017-06-26 DIAGNOSIS — Z0000 Encounter for general adult medical examination without abnormal findings: Secondary | ICD-10-CM | POA: Diagnosis not present

## 2017-06-26 DIAGNOSIS — N181 Chronic kidney disease, stage 1: Secondary | ICD-10-CM

## 2017-06-26 DIAGNOSIS — I209 Angina pectoris, unspecified: Secondary | ICD-10-CM | POA: Insufficient documentation

## 2017-06-26 NOTE — Assessment & Plan Note (Signed)
Improving with iron, currently holding with recent procedures. Pt will restart iron but at lower dose.  No clear GI cause at this point. Pt had mentioned melena in past but now is not sure it was blood. ? Possible other cause of iron def anemia? No other clear bleeding

## 2017-06-26 NOTE — Assessment & Plan Note (Signed)
Well controlled. Continue current medication.  

## 2017-06-26 NOTE — Assessment & Plan Note (Signed)
Good control on no medication. 

## 2017-06-26 NOTE — Patient Instructions (Addendum)
When able to schedule set mammogram.  Call once evaluation completed by Gi and cardiology if lower right abdominal pain continued for consideration of US imaging of right ovary/etc.\ Work on healthy low fat, low carb diet and exercise as tolerated.

## 2017-06-26 NOTE — Telephone Encounter (Signed)
  Follow up Call-  Call back number 06/25/2017  Post procedure Call Back phone  # [phone]  Permission to leave phone message Yes  Some recent data might be hidden     Patient questions:  Do you have a fever, pain , or abdominal swelling? No. Pain Score  0 *  Have you tolerated food without any problems? Yes.    Have you been able to return to your normal activities? Yes.    Do you have any questions about your discharge instructions: Diet   No. Medications  No. Follow up visit  No.  Do you have questions or concerns about your Care? No.  Actions: * If pain score is 4 or above: No action needed, pain <4.

## 2017-06-26 NOTE — Assessment & Plan Note (Signed)
Slightly worsened control. Pt reports no NSAIDs, adequate water intake.

## 2017-06-26 NOTE — Assessment & Plan Note (Signed)
Has upcoming follow up with cardiology for further assessment.  low risk stress test.

## 2017-06-26 NOTE — Assessment & Plan Note (Signed)
Stable well controlled Encouraged exercise when cleared, weight loss, healthy eating habits.

## 2017-06-26 NOTE — Assessment & Plan Note (Signed)
Unclear cause.. May consider further eval with US/CT if not improving once GI  eval completed.

## 2017-06-26 NOTE — Assessment & Plan Note (Signed)
Stable uric acid, no flares on allopurinol.

## 2017-06-26 NOTE — Progress Notes (Signed)
Subjective:    Patient ID: Virginia Wilson, female    DOB: May 25, 1956, 61 y.o.   MRN: [id number]  HPI 61 year old female presents for wellness eval.   She has also recently been found to be iron deficient (hg 7.9) on eval for leg cramps. Improving with oral iron initiation. She has since our last OV 1 month ago noted.. Chest pain, swelling in legs, SOB, abdominal bloting and RLQ abd pain.   Recent colonoscopy and endoscopy (yesterday with Dr Havery Moros) shows no GI source for acute anemia, no clear cause of  heme positive stools. She did have what sounded like melena and dark emesis. Per pt he may be considering capsule endoscopy.  Lab Results  Component Value Date   HGB 11.8 (L) 06/20/2017   She has had some improvement in her energy overall with improvement in anemia.. But still tired. Leg cramps improved with anemia. Chest pain.. Stress test was low risk. She is still haing some chest pain with exertion and at rest.. She has been trying to take it easy.  Cardiology placed her on Ranexa for possible angina.  Dr. Agustin Cree [facility].Marland Kitchen Has follow up 9/26.  She continues to have pain on right lower abdomen, abdominal bloating ongoing  X 1 month, worse in last few weeks.  No change with eating. She is post menopausal. 4/5 out of 10 on pain scale. She still has uterus, ovaries.  Hypertension:   At goal on no med. Using medication without problems or lightheadedness:  none Chest pain with exertion: see above Edema:off and on Short of breath: still some with exertion Average home BPs: Other issues:  Hypothyroidism  Stable control on levothyroxine 25 mcg. Lab Results  Component Value Date   TSH 2.06 05/17/2017   Gout  Stable on allopurinol. Lab Results  Component Value Date   LABURIC 4.7 05/29/2017   Elevated Cholesterol:  LDL at goal on  lipitor 40 Lab Results  Component Value Date   CHOL 116 06/20/2017   HDL 54.70 06/20/2017   LDLCALC 31 06/20/2017   LDLDIRECT 128.4  06/27/2011   TRIG 151.0 (H) 06/20/2017   CHOLHDL 2 06/20/2017  Using medications without problems: Muscle aches:  Diet compliance: Exercise: Other complaints:   CRI: slight worse for previous 1 months ago but better than 9 months ago.   Social History /Family History/Past Medical History reviewed in detail and updated in EMR if needed. Blood pressure 116/82, pulse 72, temperature 98.3 F (36.8 C), temperature source Oral, height 5' 6.25" (1.683 m), weight 197 lb (89.4 kg).   Review of Systems  Constitutional: Negative for fatigue and fever.  HENT: Negative for ear pain.   Eyes: Negative for pain.  Respiratory: Positive for shortness of breath. Negative for chest tightness.   Cardiovascular: Positive for chest pain and leg swelling. Negative for palpitations.  Gastrointestinal: Positive for abdominal distention and abdominal pain.  Genitourinary: Negative for dysuria.       Objective:   Physical Exam  Constitutional: Vital signs are normal. She appears well-developed and well-nourished. She is cooperative.  Non-toxic appearance. She does not appear ill. No distress.  HENT:  Head: Normocephalic.  Right Ear: Hearing, tympanic membrane, external ear and ear canal normal.  Left Ear: Hearing, tympanic membrane, external ear and ear canal normal.  Nose: Nose normal.  Eyes: Pupils are equal, round, and reactive to light. Conjunctivae, EOM and lids are normal. Lids are everted and swept, no foreign bodies found.  Neck: Trachea normal and normal  range of motion. Neck supple. Carotid bruit is not present. No thyroid mass and no thyromegaly present.  Cardiovascular: Normal rate, regular rhythm, S1 normal, S2 normal, normal heart sounds and intact distal pulses.  Exam reveals no gallop.   No murmur heard. Pulmonary/Chest: Effort normal and breath sounds normal. No respiratory distress. She has no wheezes. She has no rhonchi. She has no rales.  Abdominal: Soft. Normal appearance and bowel  sounds are normal. She exhibits no distension, no fluid wave, no abdominal bruit and no mass. There is no hepatosplenomegaly. There is tenderness in the right lower quadrant. There is no rigidity, no rebound, no guarding and no CVA tenderness. No hernia.  Lymphadenopathy:    She has no cervical adenopathy.    She has no axillary adenopathy.  Neurological: She is alert. She has normal strength. No cranial nerve deficit or sensory deficit.  Skin: Skin is warm, dry and intact. No rash noted.  Psychiatric: Her speech is normal and behavior is normal. Judgment normal. Her mood appears not anxious. Cognition and memory are normal. She does not exhibit a depressed mood.          Assessment & Plan:  The patient's preventative maintenance and recommended screening tests for an annual wellness exam were reviewed in full today. Brought up to date unless services declined.  Counselled on the importance of diet, exercise, and its role in overall health and mortality. The patient's FH and SH was reviewed, including their home life, tobacco status, and drug and alcohol status.   PAP/DVE: 02/2014, nml, no HPV, repeat in 5 years, yearly DVE Mammo: nml 12/2014, plan repeat every 2 years. DUE Colon:colonoscopy 06/2017 polyps, tics: repeat in 10 years Vaccines: Uptodate. Refused flu vaccine.  Hep C: neg RJJ:[id number]. Nonsmoker No ETOH use.

## 2017-06-29 ENCOUNTER — Other Ambulatory Visit: Payer: Self-pay | Admitting: Family Medicine

## 2017-06-29 DIAGNOSIS — Z1231 Encounter for screening mammogram for malignant neoplasm of breast: Secondary | ICD-10-CM

## 2017-07-02 ENCOUNTER — Other Ambulatory Visit: Payer: Self-pay

## 2017-07-02 DIAGNOSIS — D509 Iron deficiency anemia, unspecified: Secondary | ICD-10-CM

## 2017-07-04 ENCOUNTER — Encounter: Payer: Self-pay | Admitting: Cardiology

## 2017-07-04 ENCOUNTER — Ambulatory Visit (INDEPENDENT_AMBULATORY_CARE_PROVIDER_SITE_OTHER): Payer: 59 | Admitting: Cardiology

## 2017-07-04 ENCOUNTER — Telehealth: Payer: Self-pay

## 2017-07-04 VITALS — BP 122/78 | HR 64 | Resp 12 | Ht 66.5 in | Wt 195.1 lb

## 2017-07-04 DIAGNOSIS — E7800 Pure hypercholesterolemia, unspecified: Secondary | ICD-10-CM

## 2017-07-04 DIAGNOSIS — I10 Essential (primary) hypertension: Secondary | ICD-10-CM

## 2017-07-04 DIAGNOSIS — R7303 Prediabetes: Secondary | ICD-10-CM | POA: Diagnosis not present

## 2017-07-04 DIAGNOSIS — R079 Chest pain, unspecified: Secondary | ICD-10-CM | POA: Diagnosis not present

## 2017-07-04 NOTE — Patient Instructions (Signed)
Medication Instructions:  Your physician recommends that you continue on your current medications as directed. Please refer to the Current Medication list given to you today.  Labwork: None   Testing/Procedures: None   Follow-Up: Your physician recommends that you schedule a follow-up appointment in: 1 month   Any Other Special Instructions Will Be Listed Below (If Applicable).  Please note that any paperwork needing to be filled out by the provider will need to be addressed at the front desk prior to seeing the provider. Please note that any paperwork FMLA, Disability or other documents regarding health condition is subject to a $25.00 charge that must be received prior to completion of paperwork in the form of a money order or check.     If you need a refill on your cardiac medications before your next appointment, please call your pharmacy.  

## 2017-07-04 NOTE — Telephone Encounter (Signed)
Moved date and notified the patient that this was done.

## 2017-07-04 NOTE — Progress Notes (Signed)
Cardiology Office Note:    Date:  07/04/2017   ID:  MEILIN BROSH, DOB September 21, 1956, MRN [id number]  PCP:  Jinny Sanders, MD  Cardiologist:  [name], MD    Referring MD: Jinny Sanders, MD   Chief Complaint  Patient presents with  . 3 week follow up  still having chest pain  History of Present Illness:    Virginia Wilson is a 61 y.o. female  Was recently evaluated for chest pain. She does have some exertional chest pain which makes the situation someat concerning. However, she also had some GI bleeding and has been extensively evaluated for it so far she was fine to have some polyps that were removed as well as gastritis. Appropriate therapy has being initiated for it. She did have a stress test which showed no evidence of ischemia. In spite of that she still complaining of having pain sometimes the pain happened when she walks overall it is a stable pattern she said the frequency and intensity and duration of the  Pain is unchanged for last few months.  Past Medical History:  Diagnosis Date  . Allergic rhinitis, cause unspecified   . Anemia   . Chronic headaches   . Chronic kidney disease, unspecified    per pt, no kidney disease  . Esophageal reflux   . Extrinsic asthma, unspecified    per pt, does not have  . Family history of ischemic heart disease   . Obesity, unspecified   . Premenstrual tension syndromes   . Pure hypercholesterolemia   . SOB (shortness of breath) on exertion   . Symptomatic menopausal or female climacteric states   . Unspecified essential hypertension   . Unspecified hypothyroidism     Past Surgical History:  Procedure Laterality Date  . CARDIOVASCULAR STRESS TEST     done on 06/19/17/was normal.  . ovarian repair Left   . TONSILLECTOMY      Current Medications: Current Meds  Medication Sig  . allopurinol (ZYLOPRIM) 300 MG tablet TAKE 1 TABLET BY MOUTH  DAILY  . atorvastatin (LIPITOR) 40 MG tablet TAKE 1 TABLET BY MOUTH  DAILY  .  betamethasone dipropionate (DIPROLENE) 0.05 % cream Apply topically 2 (two) times daily.  Marland Kitchen EPINEPHrine 0.3 mg/0.3 mL IJ SOAJ injection   . ferrous sulfate 325 (65 FE) MG tablet Take 325 mg by mouth 3 (three) times daily with meals.  . fluocinonide (LIDEX) 0.05 % external solution Apply 1 application topically daily as needed.  Marland Kitchen levothyroxine (SYNTHROID, LEVOTHROID) 25 MCG tablet Take 1 tablet (25 mcg total) by mouth daily before breakfast.  . nitroGLYCERIN (NITROSTAT) 0.4 MG SL tablet Place 1 tablet (0.4 mg total) under the tongue every 5 (five) minutes as needed for chest pain.  . Omega-3 Fatty Acids (FISH OIL) 1200 MG CAPS Take 1 capsule by mouth daily.  Marland Kitchen omeprazole (PRILOSEC) 40 MG capsule Take 40 mg by mouth daily.  . ranolazine (RANEXA) 500 MG 12 hr tablet Take 1 tablet (500 mg total) by mouth 2 (two) times daily.   Current Facility-Administered Medications for the 07/04/17 encounter (Office Visit) with Park Liter, MD  Medication  . 0.9 %  sodium chloride infusion     Allergies:   Sulfonamide derivatives and Penicillins   Social History   Social History  . Marital status: Widowed    Spouse name: N/A  . Number of children: 2  . Years of education: N/A   Occupational History  . 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 Smoking status: Never Smoker  . Smokeless tobacco: Never Used  . Alcohol use No  . Drug use: No  . Sexual activity: Not Asked   Other Topics Concern  . None   Social History Narrative  . None     Family History: The patient's family history includes Coronary artery disease in her father; Diabetes in her father and mother; Heart attack in her father; Hypertension in her brother, brother, and mother; Stroke in her mother. ROS:   Please see the history of present illness.    All 14 point review of systems negative except as described per history of present illness  EKGs/Labs/Other Studies Reviewed:      Recent  Labs: 05/17/2017: TSH 2.06 06/20/2017: ALT 9; BUN 18; Creatinine, Ser 1.35; Hemoglobin 11.8; Platelets 319.0; Potassium 4.2; Sodium 139  Recent Lipid Panel    Component Value Date/Time   CHOL 116 06/20/2017 0920   TRIG 151.0 (H) 06/20/2017 0920   TRIG 294 (H) 05/12/2010 0819   HDL 54.70 06/20/2017 0920   CHOLHDL 2 06/20/2017 0920   VLDL 30.2 06/20/2017 0920   LDLCALC 31 06/20/2017 0920   LDLDIRECT 128.4 06/27/2011 1317    Physical Exam:    VS:  BP 122/78   Pulse 64   Resp 12   Ht 5' 6.5" (1.689 m)   Wt 195 lb 1.9 oz (88.5 kg)   BMI 31.02 kg/m     Wt Readings from Last 3 Encounters:  07/04/17 195 lb 1.9 oz (88.5 kg)  06/26/17 197 lb (89.4 kg)  06/25/17 198 lb (89.8 kg)     GEN:  Well nourished, well developed in no acute distress HEENT: Normal NECK: No JVD; No carotid bruits LYMPHATICS: No lymphadenopathy CARDIAC: RRR, no murmurs, no rubs, no gallops RESPIRATORY:  Clear to auscultation without rales, wheezing or rhonchi  ABDOMEN: Soft, non-tender, non-distended MUSCULOSKELETAL:  No edema; No deformity  SKIN: Warm and dry LOWER EXTREMITIES: no swelling NEUROLOGIC:  Alert and oriented x 3 PSYCHIATRIC:  Normal affect   ASSESSMENT:    1. Essential hypertension, benign   2. Chest pain, unspecified type   3. HYPERCHOLESTEROLEMIA   4. Prediabetes    PLAN:    In order of problems listed above:  1. Essential hypertension: Blood pressure seems to be well-controlled will continue present management 2. Chest pain ith negative. I asked her to try Mylanta when she has pain, I will also ask her to try nitroglycerin to see if it helps.I didn't think there is a role for cardiac catheterization at the moment with history of GI bleeding. She is not on aspirin because of that. 3. Hypercholesterolemia:We'll continue with Lipitor   Medication Adjustments/Labs and Tests Ordered: Current medicines are reviewed at length with the patient today.  Concerns regarding medicines are  outlined above.  No orders of the defined types were placed in this encounter.  Medication changes: No orders of the defined types were placed in this encounter.   Signed, Park Liter, MD, [facility] 07/04/2017 10:11 AM    [place]

## 2017-07-11 ENCOUNTER — Ambulatory Visit
Admission: RE | Admit: 2017-07-11 | Discharge: 2017-07-11 | Disposition: A | Discharge status: Home / Self Care | Payer: 59 | Source: Ambulatory Visit | Attending: Family Medicine | Admitting: Family Medicine

## 2017-07-11 DIAGNOSIS — Z1231 Encounter for screening mammogram for malignant neoplasm of breast: Secondary | ICD-10-CM | POA: Diagnosis present

## 2017-07-19 ENCOUNTER — Telehealth: Payer: Self-pay | Admitting: Family Medicine

## 2017-07-19 NOTE — Telephone Encounter (Signed)
Pt faxed fmla paperwork wanted fmla extended for 8 more weeks Paperwork in dr bedsole's in box  For review and signature

## 2017-07-20 NOTE — Telephone Encounter (Signed)
Paperwork faxed °

## 2017-07-20 NOTE — Telephone Encounter (Signed)
In outbox

## 2017-07-26 NOTE — Telephone Encounter (Signed)
Copy for pt Copy for file Copy for scan Pt aware

## 2017-07-31 ENCOUNTER — Encounter: Payer: Self-pay | Admitting: Gastroenterology

## 2017-07-31 ENCOUNTER — Ambulatory Visit (INDEPENDENT_AMBULATORY_CARE_PROVIDER_SITE_OTHER): Payer: 59 | Admitting: Gastroenterology

## 2017-07-31 DIAGNOSIS — D509 Iron deficiency anemia, unspecified: Secondary | ICD-10-CM | POA: Diagnosis not present

## 2017-07-31 NOTE — Progress Notes (Signed)
Patient here for capsule endoscopy. Tolerated procedure. Verbalizes understanding of written and verbal instructions. Family member with the patient. Lot [id number] Exp.2018-08-14 Cap ID [id number]

## 2017-08-03 ENCOUNTER — Encounter: Payer: Self-pay | Admitting: Cardiology

## 2017-08-03 ENCOUNTER — Ambulatory Visit (INDEPENDENT_AMBULATORY_CARE_PROVIDER_SITE_OTHER): Payer: 59 | Admitting: Cardiology

## 2017-08-03 VITALS — BP 130/88 | HR 78 | Ht 66.5 in | Wt 196.0 lb

## 2017-08-03 DIAGNOSIS — R7303 Prediabetes: Secondary | ICD-10-CM | POA: Diagnosis not present

## 2017-08-03 DIAGNOSIS — I10 Essential (primary) hypertension: Secondary | ICD-10-CM

## 2017-08-03 DIAGNOSIS — D509 Iron deficiency anemia, unspecified: Secondary | ICD-10-CM | POA: Diagnosis not present

## 2017-08-03 DIAGNOSIS — E7800 Pure hypercholesterolemia, unspecified: Secondary | ICD-10-CM | POA: Diagnosis not present

## 2017-08-03 MED ORDER — ISOSORBIDE MONONITRATE ER 30 MG PO TB24: 30.0000 mg | ORAL_TABLET | Freq: Every day | ORAL | 6 refills | Status: DC

## 2017-08-03 NOTE — Patient Instructions (Signed)
Medication Instructions:  Your physician has recommended you make the following change in your medication:  1.) START Imdur 30 mg daily.   1. Avoid all over-the-counter antihistamines except Claritin/Loratadine and Zyrtec/Cetrizine. 2. Avoid all combination including cold sinus allergies flu decongestant and sleep medications 3. You can use Robitussin DM Mucinex and Mucinex DM for cough. 4. can use Tylenol aspirin ibuprofen and naproxen but no combinations such as sleep or sinus.  Labwork: Your physician recommends that you have labs drawn today to check your blood cell count.   Testing/Procedures: None    Follow-Up: Your physician recommends that you schedule a follow-up appointment in: 1 month   Any Other Special Instructions Will Be Listed Below (If Applicable).  Please note that any paperwork needing to be filled out by the provider will need to be addressed at the front desk prior to seeing the provider. Please note that any paperwork FMLA, Disability or other documents regarding health condition is subject to a $25.00 charge that must be received prior to completion of paperwork in the form of a money order or check.     If you need a refill on your cardiac medications before your next appointment, please call your pharmacy.

## 2017-08-03 NOTE — Progress Notes (Signed)
Cardiology Office Note:    Date:  08/03/2017   ID:  TAURIEL Wilson, DOB [birth year], MRN [id number]  PCP:  Jinny Sanders, MD  Cardiologist:  [name], MD    Referring MD: Jinny Sanders, MD   Chief Complaint  Patient presents with  . Follow-up  I am doing better  History of Present Illness:    Virginia Wilson is a 61 y.o. female with chest pain.  She does have multiple risk factors for coronary artery disease and some of the characteristic of the pain rising suspicion for angina pectoris.  She did have a stress test however which was normal.  She does have history of recent GI bleeding therefore we cannot consider more aggressive measures at the moment.  She tells me today she is doing better she does have few episodes of chest pain but those are less frequent.  She try ranolazine but no improvement.  She did not have a chance to try nitroglycerin.  She tried Maalox for it with not much relief.  We decided to try long-acting nitroglycerin I will put her on Imdur 30 daily.  I will check her CBC today.  I see her back in my office in about 1 month  Past Medical History:  Diagnosis Date  . Allergic rhinitis, cause unspecified   . Anemia   . Chronic headaches   . Chronic kidney disease, unspecified    per pt, no kidney disease  . Esophageal reflux   . Extrinsic asthma, unspecified    per pt, does not have  . Family history of ischemic heart disease   . Obesity, unspecified   . Premenstrual tension syndromes   . Pure hypercholesterolemia   . SOB (shortness of breath) on exertion   . Symptomatic menopausal or female climacteric states   . Unspecified essential hypertension   . Unspecified hypothyroidism     Past Surgical History:  Procedure Laterality Date  . CARDIOVASCULAR STRESS TEST     done on 06/19/17/was normal.  . ovarian repair Left   . TONSILLECTOMY      Current Medications: Current Meds  Medication Sig  . allopurinol (ZYLOPRIM) 300 MG tablet TAKE 1 TABLET BY  MOUTH  DAILY  . atorvastatin (LIPITOR) 40 MG tablet TAKE 1 TABLET BY MOUTH  DAILY  . betamethasone dipropionate (DIPROLENE) 0.05 % cream Apply topically 2 (two) times daily.  Marland Kitchen EPINEPHrine 0.3 mg/0.3 mL IJ SOAJ injection   . ferrous sulfate 325 (65 FE) MG tablet Take 325 mg by mouth 3 (three) times daily with meals.  . fluocinonide (LIDEX) 0.05 % external solution Apply 1 application topically daily as needed.  Marland Kitchen levothyroxine (SYNTHROID, LEVOTHROID) 25 MCG tablet Take 1 tablet (25 mcg total) by mouth daily before breakfast.  . nitroGLYCERIN (NITROSTAT) 0.4 MG SL tablet Place 1 tablet (0.4 mg total) under the tongue every 5 (five) minutes as needed for chest pain.  . Omega-3 Fatty Acids (FISH OIL) 1200 MG CAPS Take 1 capsule by mouth daily.  Marland Kitchen omeprazole (PRILOSEC) 40 MG capsule Take 40 mg by mouth daily.   Current Facility-Administered Medications for the 08/03/17 encounter (Office Visit) with Park Liter, MD  Medication  . 0.9 %  sodium chloride infusion     Allergies:   Sulfonamide derivatives and Penicillins   Social History   Social History  . Marital status: Widowed    Spouse name: N/A  . Number of children: 2  . Years of education: N/A   Occupational  History  . 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 Smoking status: Never Smoker  . Smokeless tobacco: Never Used  . Alcohol use No  . Drug use: No  . Sexual activity: Not Asked   Other Topics Concern  . None   Social History Narrative  . None     Family History: The patient's family history includes Coronary artery disease in her father; Diabetes in her father and mother; Heart attack in her father; Hypertension in her brother, brother, and mother; Stroke in her mother. There is no history of Breast cancer. ROS:   Please see the history of present illness.    All 14 point review of systems negative except as described per history of present illness  EKGs/Labs/Other Studies Reviewed:       Recent Labs: 05/17/2017: TSH 2.06 06/20/2017: ALT 9; BUN 18; Creatinine, Ser 1.35; Hemoglobin 11.8; Platelets 319.0; Potassium 4.2; Sodium 139  Recent Lipid Panel    Component Value Date/Time   CHOL 116 06/20/2017 0920   TRIG 151.0 (H) 06/20/2017 0920   TRIG 294 (H) 05/12/2010 0819   HDL 54.70 06/20/2017 0920   CHOLHDL 2 06/20/2017 0920   VLDL 30.2 06/20/2017 0920   LDLCALC 31 06/20/2017 0920   LDLDIRECT 128.4 06/27/2011 1317    Physical Exam:    VS:  BP 130/88 (BP Location: Left Arm, Patient Position: Sitting, Cuff Size: Large)   Pulse 78   Ht 5' 6.5" (1.689 m)   Wt 196 lb (88.9 kg)   SpO2 97%   BMI 31.16 kg/m     Wt Readings from Last 3 Encounters:  08/03/17 196 lb (88.9 kg)  07/04/17 195 lb 1.9 oz (88.5 kg)  06/26/17 197 lb (89.4 kg)     GEN:  Well nourished, well developed in no acute distress HEENT: Normal NECK: No JVD; No carotid bruits LYMPHATICS: No lymphadenopathy CARDIAC: RRR, no murmurs, no rubs, no gallops RESPIRATORY:  Clear to auscultation without rales, wheezing or rhonchi  ABDOMEN: Soft, non-tender, non-distended MUSCULOSKELETAL:  No edema; No deformity  SKIN: Warm and dry LOWER EXTREMITIES: no swelling NEUROLOGIC:  Alert and oriented x 3 PSYCHIATRIC:  Normal affect   ASSESSMENT:    1. HYPERCHOLESTEROLEMIA   2. Iron deficiency anemia, unspecified iron deficiency anemia type   3. Prediabetes   4. Essential hypertension, benign    PLAN:    In order of problems listed above:  1. Hypercholesterolemia: She is on statin which I will continue. 2. I will deficiency anemia: We will check her CBC today. 3. Prediabetes: Stable followed by primary care physician 4. Angina pectoris: We will try long-acting nitrates.  I told her to let me know if frequency or intensity of pain increase. 5. Essential hypertension: Blood pressure well controlled.   Medication Adjustments/Labs and Tests Ordered: Current medicines are reviewed at length with the  patient today.  Concerns regarding medicines are outlined above.  No orders of the defined types were placed in this encounter.  Medication changes: No orders of the defined types were placed in this encounter.   Signed, Park Liter, MD, [facility] 08/03/2017 9:28 AM    [place]

## 2017-08-04 LAB — CBC WITH DIFFERENTIAL/PLATELET
BASOS ABS: 0 10*3/uL (ref 0.0–0.2)
Basos: 0 %
EOS (ABSOLUTE): 0.1 10*3/uL (ref 0.0–0.4)
Eos: 2 %
Hematocrit: 41.9 % (ref 34.0–46.6)
Hemoglobin: 13.2 g/dL (ref 11.1–15.9)
IMMATURE GRANULOCYTES: 0 %
Immature Grans (Abs): 0 10*3/uL (ref 0.0–0.1)
Lymphocytes Absolute: 1.8 10*3/uL (ref 0.7–3.1)
Lymphs: 28 %
MCH: 28.3 pg (ref 26.6–33.0)
MCHC: 31.5 g/dL (ref 31.5–35.7)
MCV: 90 fL (ref 79–97)
MONOS ABS: 0.3 10*3/uL (ref 0.1–0.9)
Monocytes: 5 %
NEUTROS PCT: 65 %
Neutrophils Absolute: 4.1 10*3/uL (ref 1.4–7.0)
PLATELETS: 294 10*3/uL (ref 150–379)
RBC: 4.67 x10E6/uL (ref 3.77–5.28)
RDW: 18.3 % — AB (ref 12.3–15.4)
WBC: 6.4 10*3/uL (ref 3.4–10.8)

## 2017-08-06 ENCOUNTER — Telehealth: Payer: Self-pay | Admitting: Gastroenterology

## 2017-08-06 ENCOUNTER — Other Ambulatory Visit: Payer: Self-pay

## 2017-08-06 DIAGNOSIS — D508 Other iron deficiency anemias: Secondary | ICD-10-CM

## 2017-08-06 NOTE — Telephone Encounter (Signed)
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   Virginia Wilson can you please call this patient and let the patient know results, not sure exactly where her anemia is coming from, but no mass lesions of the bowel which is reassuring. She has a large hiatal hernia, sometimes that can be associated with anemia but I didn't see any ulcerations of her stomach in this area to cause problems. Otherwise perhaps small bowel AVMs contributed.  Her hemoglobin normalized on iron. She should continue iron for now, repeat CBC in 3 months. Pleas let me know if she has any questions.   Thanks

## 2017-08-06 NOTE — Telephone Encounter (Signed)
Patient advised of results and recommendations. She will continue to take iron supplement and repeat CBC in 3 months.

## 2017-08-25 ENCOUNTER — Other Ambulatory Visit: Payer: Self-pay | Admitting: Family Medicine

## 2017-09-01 ENCOUNTER — Encounter (INDEPENDENT_AMBULATORY_CARE_PROVIDER_SITE_OTHER): Payer: Self-pay

## 2017-09-03 ENCOUNTER — Ambulatory Visit: Payer: 59 | Admitting: Cardiology

## 2017-10-22 DIAGNOSIS — M9903 Segmental and somatic dysfunction of lumbar region: Secondary | ICD-10-CM | POA: Diagnosis not present

## 2017-10-22 DIAGNOSIS — M9905 Segmental and somatic dysfunction of pelvic region: Secondary | ICD-10-CM | POA: Diagnosis not present

## 2017-10-22 DIAGNOSIS — M5136 Other intervertebral disc degeneration, lumbar region: Secondary | ICD-10-CM | POA: Diagnosis not present

## 2017-10-22 DIAGNOSIS — M955 Acquired deformity of pelvis: Secondary | ICD-10-CM | POA: Diagnosis not present

## 2017-10-23 ENCOUNTER — Other Ambulatory Visit (INDEPENDENT_AMBULATORY_CARE_PROVIDER_SITE_OTHER): Payer: 59

## 2017-10-23 DIAGNOSIS — D508 Other iron deficiency anemias: Secondary | ICD-10-CM

## 2017-10-23 LAB — CBC WITH DIFFERENTIAL/PLATELET
BASOS ABS: 0 10*3/uL (ref 0.0–0.1)
Basophils Relative: 0.5 % (ref 0.0–3.0)
EOS PCT: 1.8 % (ref 0.0–5.0)
Eosinophils Absolute: 0.1 10*3/uL (ref 0.0–0.7)
HCT: 39 % (ref 36.0–46.0)
Hemoglobin: 12.8 g/dL (ref 12.0–15.0)
Lymphocytes Relative: 25.7 % (ref 12.0–46.0)
Lymphs Abs: 2.1 10*3/uL (ref 0.7–4.0)
MCHC: 32.7 g/dL (ref 30.0–36.0)
MCV: 93.6 fl (ref 78.0–100.0)
MONOS PCT: 7.8 % (ref 3.0–12.0)
Monocytes Absolute: 0.6 10*3/uL (ref 0.1–1.0)
Neutro Abs: 5.2 10*3/uL (ref 1.4–7.7)
Neutrophils Relative %: 64.2 % (ref 43.0–77.0)
Platelets: 294 10*3/uL (ref 150.0–400.0)
RBC: 4.17 Mil/uL (ref 3.87–5.11)
RDW: 17.3 % — ABNORMAL HIGH (ref 11.5–15.5)
WBC: 8.1 10*3/uL (ref 4.0–10.5)

## 2017-10-29 ENCOUNTER — Other Ambulatory Visit: Payer: Self-pay | Admitting: Family Medicine

## 2017-11-19 DIAGNOSIS — M5136 Other intervertebral disc degeneration, lumbar region: Secondary | ICD-10-CM | POA: Diagnosis not present

## 2017-11-19 DIAGNOSIS — M9903 Segmental and somatic dysfunction of lumbar region: Secondary | ICD-10-CM | POA: Diagnosis not present

## 2017-11-19 DIAGNOSIS — M9905 Segmental and somatic dysfunction of pelvic region: Secondary | ICD-10-CM | POA: Diagnosis not present

## 2017-11-19 DIAGNOSIS — M955 Acquired deformity of pelvis: Secondary | ICD-10-CM | POA: Diagnosis not present

## 2017-12-24 DIAGNOSIS — M5136 Other intervertebral disc degeneration, lumbar region: Secondary | ICD-10-CM | POA: Diagnosis not present

## 2017-12-24 DIAGNOSIS — M955 Acquired deformity of pelvis: Secondary | ICD-10-CM | POA: Diagnosis not present

## 2017-12-24 DIAGNOSIS — M9903 Segmental and somatic dysfunction of lumbar region: Secondary | ICD-10-CM | POA: Diagnosis not present

## 2017-12-24 DIAGNOSIS — M9905 Segmental and somatic dysfunction of pelvic region: Secondary | ICD-10-CM | POA: Diagnosis not present

## 2018-01-22 DIAGNOSIS — M9905 Segmental and somatic dysfunction of pelvic region: Secondary | ICD-10-CM | POA: Diagnosis not present

## 2018-01-22 DIAGNOSIS — M9903 Segmental and somatic dysfunction of lumbar region: Secondary | ICD-10-CM | POA: Diagnosis not present

## 2018-01-22 DIAGNOSIS — M5136 Other intervertebral disc degeneration, lumbar region: Secondary | ICD-10-CM | POA: Diagnosis not present

## 2018-01-22 DIAGNOSIS — M955 Acquired deformity of pelvis: Secondary | ICD-10-CM | POA: Diagnosis not present

## 2018-02-16 DIAGNOSIS — W010XXA Fall on same level from slipping, tripping and stumbling without subsequent striking against object, initial encounter: Secondary | ICD-10-CM | POA: Diagnosis not present

## 2018-02-16 DIAGNOSIS — S6992XA Unspecified injury of left wrist, hand and finger(s), initial encounter: Secondary | ICD-10-CM | POA: Diagnosis not present

## 2018-02-16 DIAGNOSIS — S62307A Unspecified fracture of fifth metacarpal bone, left hand, initial encounter for closed fracture: Secondary | ICD-10-CM | POA: Diagnosis not present

## 2018-02-16 DIAGNOSIS — S60222A Contusion of left hand, initial encounter: Secondary | ICD-10-CM | POA: Diagnosis not present

## 2018-02-19 DIAGNOSIS — M9905 Segmental and somatic dysfunction of pelvic region: Secondary | ICD-10-CM | POA: Diagnosis not present

## 2018-02-19 DIAGNOSIS — M9903 Segmental and somatic dysfunction of lumbar region: Secondary | ICD-10-CM | POA: Diagnosis not present

## 2018-02-19 DIAGNOSIS — M955 Acquired deformity of pelvis: Secondary | ICD-10-CM | POA: Diagnosis not present

## 2018-02-19 DIAGNOSIS — M5136 Other intervertebral disc degeneration, lumbar region: Secondary | ICD-10-CM | POA: Diagnosis not present

## 2018-02-25 DIAGNOSIS — M25542 Pain in joints of left hand: Secondary | ICD-10-CM | POA: Diagnosis not present

## 2018-02-25 DIAGNOSIS — S62347A Nondisplaced fracture of base of fifth metacarpal bone. left hand, initial encounter for closed fracture: Secondary | ICD-10-CM | POA: Diagnosis not present

## 2018-02-26 ENCOUNTER — Telehealth: Payer: Self-pay

## 2018-02-26 DIAGNOSIS — D508 Other iron deficiency anemias: Secondary | ICD-10-CM

## 2018-02-26 NOTE — Telephone Encounter (Signed)
-----   Message from Roetta Sessions, [place] sent at 10/24/2017  1:32 PM EST ----- Regarding: cbc due Pt due for repeat cbc in May 2019 or June. Orders NOT in

## 2018-02-26 NOTE — Telephone Encounter (Signed)
Orders entered.  Called and spoke to pt.  She will go to the lab one day this week or next.

## 2018-02-27 ENCOUNTER — Other Ambulatory Visit (INDEPENDENT_AMBULATORY_CARE_PROVIDER_SITE_OTHER): Payer: 59

## 2018-02-27 DIAGNOSIS — D508 Other iron deficiency anemias: Secondary | ICD-10-CM

## 2018-02-27 LAB — CBC WITH DIFFERENTIAL/PLATELET
BASOS PCT: 0.5 % (ref 0.0–3.0)
Basophils Absolute: 0 10*3/uL (ref 0.0–0.1)
EOS ABS: 0.2 10*3/uL (ref 0.0–0.7)
Eosinophils Relative: 2 % (ref 0.0–5.0)
HEMATOCRIT: 40.6 % (ref 36.0–46.0)
Hemoglobin: 13.4 g/dL (ref 12.0–15.0)
LYMPHS PCT: 29.4 % (ref 12.0–46.0)
Lymphs Abs: 2.3 10*3/uL (ref 0.7–4.0)
MCHC: 33 g/dL (ref 30.0–36.0)
MCV: 95.4 fl (ref 78.0–100.0)
MONOS PCT: 7.8 % (ref 3.0–12.0)
Monocytes Absolute: 0.6 10*3/uL (ref 0.1–1.0)
NEUTROS ABS: 4.6 10*3/uL (ref 1.4–7.7)
Neutrophils Relative %: 60.3 % (ref 43.0–77.0)
Platelets: 272 10*3/uL (ref 150.0–400.0)
RBC: 4.25 Mil/uL (ref 3.87–5.11)
RDW: 15.2 % (ref 11.5–15.5)
WBC: 7.7 10*3/uL (ref 4.0–10.5)

## 2018-03-08 DIAGNOSIS — H04123 Dry eye syndrome of bilateral lacrimal glands: Secondary | ICD-10-CM | POA: Diagnosis not present

## 2018-03-08 DIAGNOSIS — H16141 Punctate keratitis, right eye: Secondary | ICD-10-CM | POA: Diagnosis not present

## 2018-03-11 DIAGNOSIS — S62347D Nondisplaced fracture of base of fifth metacarpal bone. left hand, subsequent encounter for fracture with routine healing: Secondary | ICD-10-CM | POA: Diagnosis not present

## 2018-03-19 DIAGNOSIS — M9905 Segmental and somatic dysfunction of pelvic region: Secondary | ICD-10-CM | POA: Diagnosis not present

## 2018-03-19 DIAGNOSIS — M955 Acquired deformity of pelvis: Secondary | ICD-10-CM | POA: Diagnosis not present

## 2018-03-19 DIAGNOSIS — M5136 Other intervertebral disc degeneration, lumbar region: Secondary | ICD-10-CM | POA: Diagnosis not present

## 2018-03-19 DIAGNOSIS — M9903 Segmental and somatic dysfunction of lumbar region: Secondary | ICD-10-CM | POA: Diagnosis not present

## 2018-04-01 DIAGNOSIS — S62347D Nondisplaced fracture of base of fifth metacarpal bone. left hand, subsequent encounter for fracture with routine healing: Secondary | ICD-10-CM | POA: Diagnosis not present

## 2018-04-04 ENCOUNTER — Other Ambulatory Visit: Payer: Self-pay | Admitting: Family Medicine

## 2018-04-16 DIAGNOSIS — M955 Acquired deformity of pelvis: Secondary | ICD-10-CM | POA: Diagnosis not present

## 2018-04-16 DIAGNOSIS — M9905 Segmental and somatic dysfunction of pelvic region: Secondary | ICD-10-CM | POA: Diagnosis not present

## 2018-04-16 DIAGNOSIS — M9903 Segmental and somatic dysfunction of lumbar region: Secondary | ICD-10-CM | POA: Diagnosis not present

## 2018-04-16 DIAGNOSIS — M5136 Other intervertebral disc degeneration, lumbar region: Secondary | ICD-10-CM | POA: Diagnosis not present

## 2018-04-25 ENCOUNTER — Other Ambulatory Visit: Payer: Self-pay | Admitting: Family Medicine

## 2018-05-02 ENCOUNTER — Encounter: Payer: Self-pay | Admitting: Occupational Therapy

## 2018-05-02 ENCOUNTER — Other Ambulatory Visit: Payer: Self-pay

## 2018-05-02 ENCOUNTER — Ambulatory Visit: Payer: 59 | Attending: Orthopedic Surgery | Admitting: Occupational Therapy

## 2018-05-02 DIAGNOSIS — M79642 Pain in left hand: Secondary | ICD-10-CM | POA: Diagnosis not present

## 2018-05-02 DIAGNOSIS — M25642 Stiffness of left hand, not elsewhere classified: Secondary | ICD-10-CM | POA: Diagnosis not present

## 2018-05-02 DIAGNOSIS — M25532 Pain in left wrist: Secondary | ICD-10-CM | POA: Insufficient documentation

## 2018-05-02 DIAGNOSIS — M6281 Muscle weakness (generalized): Secondary | ICD-10-CM | POA: Diagnosis not present

## 2018-05-02 DIAGNOSIS — M25632 Stiffness of left wrist, not elsewhere classified: Secondary | ICD-10-CM | POA: Insufficient documentation

## 2018-05-02 NOTE — Patient Instructions (Signed)
Contrast   Tapping of digits extention  Tendon glides  10 reps  2-3 x day   PROM for wrist ext, flexion  10 reps  AROM for wrist in all planes - 10 reps   2-3 x day  Ice at end as needd

## 2018-05-02 NOTE — Therapy (Signed)
[place] [facility] [address] [place], [address] Phone: [phone]   Fax:  [phone]  Occupational Therapy Evaluation  Patient Details  Name: Virginia Wilson MRN: [id number] Date of Birth: June 14, 1956 Referring Provider: Arvella Nigh    Encounter Date: 05/02/2018  OT End of Session - 05/02/18 1653    Visit Number  1    Number of Visits  3    Date for OT Re-Evaluation  05/30/18    OT Start Time  1555    OT Stop Time  1647    OT Time Calculation (min)  52 min    Activity Tolerance  Patient tolerated treatment well    Behavior During Therapy  [facility] for tasks assessed/performed       Past Medical History:  Diagnosis Date  . Allergic rhinitis, cause unspecified   . Anemia   . Chronic headaches   . Chronic kidney disease, unspecified    per pt, no kidney disease  . Esophageal reflux   . Extrinsic asthma, unspecified    per pt, does not have  . Family history of ischemic heart disease   . Obesity, unspecified   . Premenstrual tension syndromes   . Pure hypercholesterolemia   . SOB (shortness of breath) on exertion   . Symptomatic menopausal or female climacteric states   . Unspecified essential hypertension   . Unspecified hypothyroidism     Past Surgical History:  Procedure Laterality Date  . CARDIOVASCULAR STRESS TEST     done on 06/19/17/was normal.  . ovarian repair Left   . TONSILLECTOMY      There were no vitals filed for this visit.  Subjective Assessment - 05/02/18 1647    Subjective   I  caught myself and hit my hand against something and broked that bone on side of hand - was in splint and been out of it for about 2-3 wks but my wrist still bother me , and making tight fist and lifthing or pushing wiith my hand    Patient Stated Goals  Want to get the motion back in my hand and wrist ,and strenght - without pain - to lift , push , squeeze objects , typing better     Currently in Pain?  No/denies         [facility] OT Assessment - 05/02/18 0001      Assessment   Medical Diagnosis  non displaced fx of 5th shaft  proximal     Referring Provider  [place]     Onset Date/Surgical Date  02/16/18    Hand Dominance  Right      Home  Environment   Lives With  Alone      Prior Function   Vocation  Full time employment    Leisure  work on computer - typing  and do own house work and read       AROM   Right Wrist Extension  65 Degrees    Right Wrist Flexion  90 Degrees    Right Wrist Radial Deviation  30 Degrees    Right Wrist Ulnar Deviation  28 Degrees    Left Wrist Extension  50 Degrees    Left Wrist Flexion  60 Degrees    Left Wrist Radial Deviation  15 Degrees    Left Wrist Ulnar Deviation  35 Degrees      Strength   Right Hand Grip (lbs)  55    Right Hand Lateral Pinch  15 lbs    Right Hand 3 Point Pinch  17 lbs    Left Hand Grip (lbs)  20    Left Hand Lateral Pinch  15 lbs    Left Hand 3 Point Pinch  10 lbs      Left Hand AROM   L Index  MCP 0-90  85 Degrees    L Index PIP 0-100  100 Degrees    L Long  MCP 0-90  85 Degrees    L Long PIP 0-100  100 Degrees    L Ring  MCP 0-90  80 Degrees    L Ring PIP 0-100  100 Degrees    L Little  MCP 0-90  75 Degrees    L Little PIP 0-100  85 Degrees         fluidotherapy done with AROM to digits and wrist in all planes - L hand and wrist - great progress afterwards  Review HEP - hand out provided      Contrast   Tapping of digits extention  Tendon glides  10 reps  2-3 x day   PROM for wrist ext, flexion  10 reps  AROM for wrist in all planes - 10 reps   2-3 x day  Ice at end as needd           OT Education - 05/02/18 1653    Education Details  findings of eval and HEP     Person(s) Educated  Patient    Methods  Explanation;Demonstration    Comprehension  Verbalized understanding;Returned demonstration       OT Short Term Goals - 05/02/18 1658      OT SHORT TERM GOAL #1   Title  Pain to improve to  less than 5 points on PRWHE     Baseline  pain increase to 4/10 at the worse -and at eval on PRWHE 10/50     Time  2    Period  Weeks    Status  New    Target Date  05/16/18      OT SHORT TERM GOAL #2   Title  Pt to be ind in HEP to increase AROM in L hand digits and wrist to WNL     Baseline  see flowsheet     Time  3    Period  Weeks    Status  New    Target Date  05/23/18        OT Long Term Goals - 05/02/18 1659      OT LONG TERM GOAL #1   Title  Pt to increase grip on PRWHE by more than 10 lbs to carry  5 lbs without symptoms , squeeze washcloth and cut with knife     Baseline  grip on R 55, L 20 lbs     Time  4    Period  Weeks    Status  New    Target Date  05/30/18      OT LONG TERM GOAL #2   Title  FUnction score on PRWHE improve wiht more than 8 pionts     Baseline  Function score at eval 12/50     Time  4    Period  Weeks    Status  New    Target Date  05/30/18            Plan - 05/02/18 1655    Clinical Impression Statement  Pt present at OT eval about 11  wks out from non displaced 5th MC fx - pt  show still increase edema , increase pain on ulnar side of hand and wrist , decrease AROM and strength in L hand and wrist - limiting her functional use with mostly pushing , pulling , lifting , gripping and  typing     Occupational performance deficits (Please refer to evaluation for details):  ADL's;IADL's;Work;Leisure    Rehab Potential  Excellent    OT Frequency  1x / week    OT Duration  4 weeks    OT Treatment/Interventions  Self-care/ADL training;Therapeutic exercise;Contrast Bath;Manual Therapy;Fluidtherapy;Passive range of motion;Patient/family education    Plan   assess progress with HEP and update - add strengthening if needed     OT Home Exercise Plan  see pt instruction     Consulted and Agree with Plan of Care  Patient       Patient will benefit from skilled therapeutic intervention in order to improve the following deficits and impairments:   Impaired flexibility, Increased edema, Pain, Decreased range of motion, Impaired UE functional use, Decreased strength  Visit Diagnosis: Stiffness of left wrist, not elsewhere classified - Plan: Ot plan of care cert/re-cert  Stiffness of left hand, not elsewhere classified - Plan: Ot plan of care cert/re-cert  Pain in left hand - Plan: Ot plan of care cert/re-cert  Pain in left wrist - Plan: Ot plan of care cert/re-cert  Muscle weakness (generalized) - Plan: Ot plan of care cert/re-cert    Problem List Patient Active Problem List   Diagnosis Date Noted  . Angina pectoris ([place]) 06/26/2017  . Right lower quadrant abdominal pain 06/26/2017  . Iron deficiency anemia 06/05/2017  . Right foot pain 05/17/2017  . Insomnia 08/22/2016  . Prediabetes 02/23/2015  . Chronic renal insufficiency, stage I 02/06/2014  . Gout with manifestations 02/23/2011  . PSORIASIS, SCALP 12/02/2010  . Hypothyroidism 05/22/2007  . HYPERCHOLESTEROLEMIA 01/21/2007  . Essential hypertension, benign 01/21/2007  . ALLERGIC RHINITIS 01/21/2007  . ASTHMA, CHILDHOOD 01/21/2007  . GERD 01/21/2007  . MIGRAINE, MENSTRUAL 01/21/2007  . MENOPAUSAL SYNDROME 01/21/2007    Rosalyn Gess OTR/l,CLT 05/02/2018, 5:03 PM  [place] [facility] [address], [place], [address] Phone: [phone]   Fax:  [phone]  Name: Virginia Wilson MRN: [id number] Date of Birth: December 17, 1955

## 2018-05-09 ENCOUNTER — Ambulatory Visit: Payer: 59 | Attending: Orthopedic Surgery | Admitting: Occupational Therapy

## 2018-05-09 DIAGNOSIS — M25632 Stiffness of left wrist, not elsewhere classified: Secondary | ICD-10-CM

## 2018-05-09 DIAGNOSIS — M79642 Pain in left hand: Secondary | ICD-10-CM | POA: Insufficient documentation

## 2018-05-09 DIAGNOSIS — M6281 Muscle weakness (generalized): Secondary | ICD-10-CM | POA: Insufficient documentation

## 2018-05-09 DIAGNOSIS — M25642 Stiffness of left hand, not elsewhere classified: Secondary | ICD-10-CM | POA: Insufficient documentation

## 2018-05-09 DIAGNOSIS — M25532 Pain in left wrist: Secondary | ICD-10-CM | POA: Diagnosis not present

## 2018-05-09 NOTE — Patient Instructions (Signed)
Do first 2 steps of tendon glide - hold off on tight grip  Also during functional use hold off on tight and sustained grip   Cont composite flexion stretch for wrist  And then 2 lbs weight in all planes  15 reps   1 x day  And increase every 3 days 2 sets and then 3 sets   Ice massage over A1pulley at 4th several times during day

## 2018-05-09 NOTE — Therapy (Signed)
[facility] [facility] [address], [place], [address] Phone: [phone]   Fax:  [phone]  Occupational Therapy Treatment  Patient Details  Name: Virginia Wilson MRN: [id number] Date of Birth: [birth year] Referring Provider: Arvella Nigh    Encounter Date: 05/09/2018  OT End of Session - 05/09/18 1438    Visit Number  2    Number of Visits  3    Date for OT Re-Evaluation  05/30/18    OT Start Time  1430    OT Stop Time  1501    OT Time Calculation (min)  31 min    Activity Tolerance  Patient tolerated treatment well    Behavior During Therapy  [facility] for tasks assessed/performed       Past Medical History:  Diagnosis Date  . Allergic rhinitis, cause unspecified   . Anemia   . Chronic headaches   . Chronic kidney disease, unspecified    per pt, no kidney disease  . Esophageal reflux   . Extrinsic asthma, unspecified    per pt, does not have  . Family history of ischemic heart disease   . Obesity, unspecified   . Premenstrual tension syndromes   . Pure hypercholesterolemia   . SOB (shortness of breath) on exertion   . Symptomatic menopausal or female climacteric states   . Unspecified essential hypertension   . Unspecified hypothyroidism     Past Surgical History:  Procedure Laterality Date  . CARDIOVASCULAR STRESS TEST     done on 06/19/17/was normal.  . ovarian repair Left   . TONSILLECTOMY      There were no vitals filed for this visit.  Subjective Assessment - 05/09/18 1431    Subjective   I am using my hand more - gripping and pulling - but my ring finger is doing some locking and tender     Patient Stated Goals  Want to get the motion back in my hand and wrist ,and strenght - without pain - to lift , push , squeeze objects , typing better     Currently in Pain?  No/denies         [facility] OT Assessment - 05/09/18 0001      AROM   Left Wrist Extension  65 Degrees    Left Wrist Flexion  75 Degrees       Strength   Left Hand Grip (lbs)  30      Left Hand AROM   L Little  MCP 0-90  85 Degrees    L Little PIP 0-100  95 Degrees          Great progress in AROM 5th digit and wrist and grip strength Pt report some clicking in 4th digit  Pt is tender over A1pulley at 4th - but did not show triggering in clinic      OT Treatments/Exercises (OP) - 05/09/18 0001      Ultrasound   Ultrasound Location  --    Ultrasound Parameters  --    Ultrasound Goals  --      LUE Fluidotherapy   Number Minutes Fluidotherapy  8 Minutes    LUE Fluidotherapy Location  Hand    Comments  AROM for wrist and digits      Graston tool nr 2 - sweeping done over volar and dorsal wrist to increase wrist motion And over volar 4th and 5th - and palm prior to ROM   AAROM for MC flexion  Intrinsic  fist AROM  10 reps Wrist flexion stretch  2 lbs for L wrist in all planes 12 reps   no issues or pain   For HEP :   o first 2 steps of tendon glide - hold off on tight grip  Also during functional use hold off on tight and sustained grip   Cont composite flexion stretch for wrist  And then 2 lbs weight in all planes  15 reps   1 x day  And increase every 3 days 2 sets and then 3 sets   Ice massage over A1pulley at 4th several times during day         OT Education - 05/09/18 1438    Education Details  HEP adjusted - and to decrease triggeirng on 4th     Person(s) Educated  Patient    Methods  Explanation;Demonstration    Comprehension  Verbalized understanding;Returned demonstration       OT Short Term Goals - 05/02/18 1658      OT SHORT TERM GOAL #1   Title  Pain to improve to less than 5 points on PRWHE     Baseline  pain increase to 4/10 at the worse -and at eval on PRWHE 10/50     Time  2    Period  Weeks    Status  New    Target Date  05/16/18      OT SHORT TERM GOAL #2   Title  Pt to be ind in HEP to increase AROM in L hand digits and wrist to WNL     Baseline  see flowsheet      Time  3    Period  Weeks    Status  New    Target Date  05/23/18        OT Long Term Goals - 05/02/18 1659      OT LONG TERM GOAL #1   Title  Pt to increase grip on PRWHE by more than 10 lbs to carry  5 lbs without symptoms , squeeze washcloth and cut with knife     Baseline  grip on R 55, L 20 lbs     Time  4    Period  Weeks    Status  New    Target Date  05/30/18      OT LONG TERM GOAL #2   Title  FUnction score on PRWHE improve wiht more than 8 pionts     Baseline  Function score at eval 12/50     Time  4    Period  Weeks    Status  New    Target Date  05/30/18            Plan - 05/09/18 1439    Clinical Impression Statement  Pt made great progress in week in 5th digit flexion at PIP and MC - and wrist AROM - decrease stiffness and pain - pt do report some discomfort in 4th digit with some clicking daily or locking - pt is tender over A1pulley of 4th - pt ed on avoiding tight and sustained grip - work on [employer] flexion and intrinsic fist - and can do 2 lbs weight for wrist - will hold off on putty     Occupational performance deficits (Please refer to evaluation for details):  ADL's;IADL's;Work;Leisure    Rehab Potential  Excellent    OT Frequency  1x / week    OT Duration  4 weeks    OT  Treatment/Interventions  Self-care/ADL training;Therapeutic exercise;Contrast Bath;Manual Therapy;Fluidtherapy;Passive range of motion;Patient/family education    Plan   assess progress with HEP - tenderness and trigging ? 4th -     Clinical Decision Making  Limited treatment options, no task modification necessary    OT Home Exercise Plan  see pt instruction     Consulted and Agree with Plan of Care  Patient       Patient will benefit from skilled therapeutic intervention in order to improve the following deficits and impairments:  Impaired flexibility, Increased edema, Pain, Decreased range of motion, Impaired UE functional use, Decreased strength  Visit Diagnosis: Stiffness  of left wrist, not elsewhere classified  Stiffness of left hand, not elsewhere classified  Pain in left hand  Pain in left wrist  Muscle weakness (generalized)    Problem List Patient Active Problem List   Diagnosis Date Noted  . Angina pectoris ([place]) 06/26/2017  . Right lower quadrant abdominal pain 06/26/2017  . Iron deficiency anemia 06/05/2017  . Right foot pain 05/17/2017  . Insomnia 08/22/2016  . Prediabetes 02/23/2015  . Chronic renal insufficiency, stage I 02/06/2014  . Gout with manifestations 02/23/2011  . PSORIASIS, SCALP 12/02/2010  . Hypothyroidism 05/22/2007  . HYPERCHOLESTEROLEMIA 01/21/2007  . Essential hypertension, benign 01/21/2007  . ALLERGIC RHINITIS 01/21/2007  . ASTHMA, CHILDHOOD 01/21/2007  . GERD 01/21/2007  . MIGRAINE, MENSTRUAL 01/21/2007  . MENOPAUSAL SYNDROME 01/21/2007    Rosalyn Gess OTR/l,CLT 05/09/2018, 3:16 PM  [place] [facility] [address], [place], [address] Phone: [phone]   Fax:  [phone]  Name: Virginia Wilson MRN: [id number] Date of Birth: September 11, 1956

## 2018-05-14 DIAGNOSIS — M5136 Other intervertebral disc degeneration, lumbar region: Secondary | ICD-10-CM | POA: Diagnosis not present

## 2018-05-14 DIAGNOSIS — M9903 Segmental and somatic dysfunction of lumbar region: Secondary | ICD-10-CM | POA: Diagnosis not present

## 2018-05-14 DIAGNOSIS — M955 Acquired deformity of pelvis: Secondary | ICD-10-CM | POA: Diagnosis not present

## 2018-05-14 DIAGNOSIS — M9905 Segmental and somatic dysfunction of pelvic region: Secondary | ICD-10-CM | POA: Diagnosis not present

## 2018-05-20 DIAGNOSIS — M5136 Other intervertebral disc degeneration, lumbar region: Secondary | ICD-10-CM | POA: Diagnosis not present

## 2018-05-20 DIAGNOSIS — M9903 Segmental and somatic dysfunction of lumbar region: Secondary | ICD-10-CM | POA: Diagnosis not present

## 2018-05-20 DIAGNOSIS — M9905 Segmental and somatic dysfunction of pelvic region: Secondary | ICD-10-CM | POA: Diagnosis not present

## 2018-05-20 DIAGNOSIS — M955 Acquired deformity of pelvis: Secondary | ICD-10-CM | POA: Diagnosis not present

## 2018-05-22 DIAGNOSIS — M9903 Segmental and somatic dysfunction of lumbar region: Secondary | ICD-10-CM | POA: Diagnosis not present

## 2018-05-22 DIAGNOSIS — M5136 Other intervertebral disc degeneration, lumbar region: Secondary | ICD-10-CM | POA: Diagnosis not present

## 2018-05-22 DIAGNOSIS — M9905 Segmental and somatic dysfunction of pelvic region: Secondary | ICD-10-CM | POA: Diagnosis not present

## 2018-05-22 DIAGNOSIS — M955 Acquired deformity of pelvis: Secondary | ICD-10-CM | POA: Diagnosis not present

## 2018-05-27 DIAGNOSIS — M955 Acquired deformity of pelvis: Secondary | ICD-10-CM | POA: Diagnosis not present

## 2018-05-27 DIAGNOSIS — M5136 Other intervertebral disc degeneration, lumbar region: Secondary | ICD-10-CM | POA: Diagnosis not present

## 2018-05-27 DIAGNOSIS — M9903 Segmental and somatic dysfunction of lumbar region: Secondary | ICD-10-CM | POA: Diagnosis not present

## 2018-05-27 DIAGNOSIS — M9905 Segmental and somatic dysfunction of pelvic region: Secondary | ICD-10-CM | POA: Diagnosis not present

## 2018-05-30 ENCOUNTER — Ambulatory Visit: Payer: 59 | Admitting: Occupational Therapy

## 2018-05-30 DIAGNOSIS — M6281 Muscle weakness (generalized): Secondary | ICD-10-CM

## 2018-05-30 DIAGNOSIS — M25532 Pain in left wrist: Secondary | ICD-10-CM

## 2018-05-30 DIAGNOSIS — M25632 Stiffness of left wrist, not elsewhere classified: Secondary | ICD-10-CM

## 2018-05-30 DIAGNOSIS — M25642 Stiffness of left hand, not elsewhere classified: Secondary | ICD-10-CM | POA: Diagnosis not present

## 2018-05-30 DIAGNOSIS — M79642 Pain in left hand: Secondary | ICD-10-CM

## 2018-05-30 NOTE — Patient Instructions (Signed)
Cont with contrast   massage volar 4th and 5th  Base of hand and forearm - volar and ulnar side   Tendon glides  And PROM composite flexion   Teal putty for gripping , 3 point grip  But roll putty in between to prevent spasm or pain over A1 pulley of 4th  And then table slides thru towel - and gradually increase weight thru towel  25% and then 50% -  Pain free  All act above pain should be less than 2/10

## 2018-05-30 NOTE — Therapy (Signed)
[place] [facility] [address] [place], [address] Phone: [phone]   Fax:  [phone]  Occupational Therapy Treatment  Patient Details  Name: Virginia Wilson MRN: [id number] Date of Birth: [birth year] Referring Provider: Arvella Nigh    Encounter Date: 05/30/2018  OT End of Session - 05/30/18 1628    Visit Number  3    Number of Visits  3    Date for OT Re-Evaluation  05/30/18    OT Start Time  1547    OT Stop Time  1612    OT Time Calculation (min)  25 min    Activity Tolerance  Patient tolerated treatment well    Behavior During Therapy  [facility] for tasks assessed/performed       Past Medical History:  Diagnosis Date  . Allergic rhinitis, cause unspecified   . Anemia   . Chronic headaches   . Chronic kidney disease, unspecified    per pt, no kidney disease  . Esophageal reflux   . Extrinsic asthma, unspecified    per pt, does not have  . Family history of ischemic heart disease   . Obesity, unspecified   . Premenstrual tension syndromes   . Pure hypercholesterolemia   . SOB (shortness of breath) on exertion   . Symptomatic menopausal or female climacteric states   . Unspecified essential hypertension   . Unspecified hypothyroidism     Past Surgical History:  Procedure Laterality Date  . CARDIOVASCULAR STRESS TEST     done on 06/19/17/was normal.  . ovarian repair Left   . TONSILLECTOMY      There were no vitals filed for this visit.  Subjective Assessment - 05/30/18 1621    Subjective   I done the exercises like you told me- still having pain with lifting or carrying something at times - or holding something small with pinkie -was carry bottle mouse the other day and my side of wrist bother me for about 3 dasy     Patient Stated Goals  Want to get the motion back in my hand and wrist ,and strenght - without pain - to lift , push , squeeze objects , typing better     Currently in Pain?  Yes    Pain  Score  2     Pain Location  Hand    Pain Orientation  Left    Pain Descriptors / Indicators  Aching    Pain Type  Acute pain    Pain Onset  More than a month ago    Pain Frequency  Intermittent         [facility] OT Assessment - 05/30/18 0001      AROM   Right Wrist Extension  65 Degrees    Right Wrist Flexion  90 Degrees    Left Wrist Extension  65 Degrees    Left Wrist Flexion  80 Degrees      Strength   Right Hand Grip (lbs)  55    Right Hand Lateral Pinch  15 lbs    Right Hand 3 Point Pinch  17 lbs    Left Hand Grip (lbs)  25    Left Hand Lateral Pinch  15 lbs    Left Hand 3 Point Pinch  12 lbs      Assess AROM for 5th digit - flexion - MC 80 and PIP increase to 95 after graston  Assess grip and prehension strength   Graston tool nr 2 -  sweeping done over volar and dorsal wrist to increase wrist motion And over volar 4th and 5th - and palm prior to ROM   AAROM for MC flexion  Intrinsic fist AROM and full fist   No tenderness over A1pulley at 4th  Add teal putty for grip ,and 3 point   no issues - need to keep pain free  And do rolling of putty every 5 reps  One time day   20 reps Ice massage over A1pulley after putty   Table slides for wrist ext - to prep for weight bearing   pt to do pain free at home  1 x day on towel 20 reps  ed on doing      HEP   Cont with contrast   massage volar 4th and 5th  Base of hand and forearm - volar and ulnar side   Tendon glides  And PROM composite flexion   Teal putty for gripping , 3 point grip  But roll putty in between to prevent spasm or pain over A1 pulley of 4th  And then table slides thru towel - and gradually increase weight thru towel  25% and then 50% -  Pain free  All act above pain should be less than 2/10            OT Education - 05/30/18 1628    Education Details  HEP upgrade    Person(s) Educated  Patient    Methods  Explanation;Demonstration    Comprehension  Verbalized  understanding;Returned demonstration       OT Short Term Goals - 05/30/18 1632      OT SHORT TERM GOAL #1   Title  Pain to improve to less than 5 points on PRWHE     Baseline  pain increase to 3/10 at the worse -and at eval on PRWHE 10/50 - will assess next time     Time  2    Period  Weeks    Status  On-going    Target Date  05/30/18      OT SHORT TERM GOAL #2   Title  Pt to be ind in HEP to increase AROM in L hand digits and wrist to WNL     Status  Achieved        OT Long Term Goals - 05/30/18 1633      OT LONG TERM GOAL #1   Title  Pt to increase grip on PRWHE by more than 10 lbs to carry  5 lbs without symptoms , squeeze washcloth and cut with knife     Baseline  grip on R 55, L 25 lbs  - but had increase pain on ulnar side of wrist with gripping and carrying object    Time  2    Period  Weeks    Status  On-going    Target Date  05/30/18      OT LONG TERM GOAL #2   Title  FUnction score on PRWHE improve wiht more than 8 pionts     Baseline  Function score at eval 12/50 - will assess if pain call     Time  2    Period  Weeks    Status  On-going    Target Date  05/30/18            Plan - 05/30/18 1629    Clinical Impression Statement  Pt was not seen for 3 wks - pt return this date with cont decrease 5th  AROM for flexion at [facility] and PIP - but after AROM and soft tissue increase - pt still some edema- that is normal - to cont wtih contrast - did provided this date pt with putty forgrip - her grip still more than 50% decrease compare to R - but had lsat time trigger finger on 4th - pt to do ice massage and stop if feel pain or triggering - upgrade her HEP for wrist flexion and extention -and ROM and grip strength of hand - pt to phone if need to be seen - pt prefer to do her HEP and call me     Occupational performance deficits (Please refer to evaluation for details):  ADL's;IADL's;Work;Leisure    Rehab Potential  Good    OT Frequency  1x / week    OT Duration  2  weeks    OT Treatment/Interventions  Self-care/ADL training;Therapeutic exercise;Contrast Bath;Manual Therapy;Fluidtherapy;Passive range of motion;Patient/family education    Plan  pt will call me if need to be seen     Clinical Decision Making  Limited treatment options, no task modification necessary    OT Home Exercise Plan  see pt instruction     Consulted and Agree with Plan of Care  Patient       Patient will benefit from skilled therapeutic intervention in order to improve the following deficits and impairments:  Impaired flexibility, Increased edema, Pain, Decreased range of motion, Impaired UE functional use, Decreased strength  Visit Diagnosis: Stiffness of left wrist, not elsewhere classified  Stiffness of left hand, not elsewhere classified  Pain in left hand  Pain in left wrist  Muscle weakness (generalized)    Problem List Patient Active Problem List   Diagnosis Date Noted  . Angina pectoris ([place]) 06/26/2017  . Right lower quadrant abdominal pain 06/26/2017  . Iron deficiency anemia 06/05/2017  . Right foot pain 05/17/2017  . Insomnia 08/22/2016  . Prediabetes 02/23/2015  . Chronic renal insufficiency, stage I 02/06/2014  . Gout with manifestations 02/23/2011  . PSORIASIS, SCALP 12/02/2010  . Hypothyroidism 05/22/2007  . HYPERCHOLESTEROLEMIA 01/21/2007  . Essential hypertension, benign 01/21/2007  . ALLERGIC RHINITIS 01/21/2007  . ASTHMA, CHILDHOOD 01/21/2007  . GERD 01/21/2007  . MIGRAINE, MENSTRUAL 01/21/2007  . MENOPAUSAL SYNDROME 01/21/2007    Rosalyn Gess OTR/L,CLT 05/30/2018, 4:34 PM  [place] [facility] [address], [place], [address] Phone: [phone]   Fax:  [phone]  Name: Virginia Wilson MRN: [id number] Date of Birth: Jun 06, 1956

## 2018-06-12 ENCOUNTER — Other Ambulatory Visit: Payer: Self-pay | Admitting: Family Medicine

## 2018-06-15 ENCOUNTER — Other Ambulatory Visit: Payer: Self-pay | Admitting: Family Medicine

## 2018-06-20 ENCOUNTER — Telehealth: Payer: Self-pay | Admitting: Family Medicine

## 2018-06-21 NOTE — Telephone Encounter (Signed)
Due for thyroid recheck.. Will fill x 90 days but will need appt prior to any refills after that.

## 2018-06-21 NOTE — Telephone Encounter (Signed)
Electronic refill request Last office visit 06/26/17 Last TSH 8/9/1/8 Last refill  04/26/18 #90 Upcoming appointment 09/10/18

## 2018-06-21 NOTE — Telephone Encounter (Signed)
Please schedule appointment as instructed. 

## 2018-06-24 NOTE — Telephone Encounter (Signed)
Does pt need just a follow up appointment  With labs prior or just follow up appointment?

## 2018-06-25 DIAGNOSIS — M5136 Other intervertebral disc degeneration, lumbar region: Secondary | ICD-10-CM | POA: Diagnosis not present

## 2018-06-25 DIAGNOSIS — M955 Acquired deformity of pelvis: Secondary | ICD-10-CM | POA: Diagnosis not present

## 2018-06-25 DIAGNOSIS — M9905 Segmental and somatic dysfunction of pelvic region: Secondary | ICD-10-CM | POA: Diagnosis not present

## 2018-06-25 DIAGNOSIS — M9903 Segmental and somatic dysfunction of lumbar region: Secondary | ICD-10-CM | POA: Diagnosis not present

## 2018-06-25 NOTE — Telephone Encounter (Signed)
Appointment 12/3

## 2018-06-25 NOTE — Telephone Encounter (Signed)
CPX with labs prior

## 2018-07-24 DIAGNOSIS — M5136 Other intervertebral disc degeneration, lumbar region: Secondary | ICD-10-CM | POA: Diagnosis not present

## 2018-07-24 DIAGNOSIS — M9905 Segmental and somatic dysfunction of pelvic region: Secondary | ICD-10-CM | POA: Diagnosis not present

## 2018-07-24 DIAGNOSIS — M955 Acquired deformity of pelvis: Secondary | ICD-10-CM | POA: Diagnosis not present

## 2018-07-24 DIAGNOSIS — M9903 Segmental and somatic dysfunction of lumbar region: Secondary | ICD-10-CM | POA: Diagnosis not present

## 2018-08-11 ENCOUNTER — Other Ambulatory Visit: Payer: Self-pay | Admitting: Family Medicine

## 2018-08-14 DIAGNOSIS — M955 Acquired deformity of pelvis: Secondary | ICD-10-CM | POA: Diagnosis not present

## 2018-08-14 DIAGNOSIS — M9903 Segmental and somatic dysfunction of lumbar region: Secondary | ICD-10-CM | POA: Diagnosis not present

## 2018-08-14 DIAGNOSIS — M5136 Other intervertebral disc degeneration, lumbar region: Secondary | ICD-10-CM | POA: Diagnosis not present

## 2018-08-14 DIAGNOSIS — M9905 Segmental and somatic dysfunction of pelvic region: Secondary | ICD-10-CM | POA: Diagnosis not present

## 2018-08-21 DIAGNOSIS — M9905 Segmental and somatic dysfunction of pelvic region: Secondary | ICD-10-CM | POA: Diagnosis not present

## 2018-08-21 DIAGNOSIS — M9903 Segmental and somatic dysfunction of lumbar region: Secondary | ICD-10-CM | POA: Diagnosis not present

## 2018-08-21 DIAGNOSIS — M5136 Other intervertebral disc degeneration, lumbar region: Secondary | ICD-10-CM | POA: Diagnosis not present

## 2018-08-21 DIAGNOSIS — M955 Acquired deformity of pelvis: Secondary | ICD-10-CM | POA: Diagnosis not present

## 2018-08-30 ENCOUNTER — Telehealth: Payer: Self-pay | Admitting: *Deleted

## 2018-08-30 DIAGNOSIS — D509 Iron deficiency anemia, unspecified: Secondary | ICD-10-CM

## 2018-08-30 NOTE — Telephone Encounter (Signed)
-----   Message from Roetta Sessions, [place] sent at 02/28/2018  8:05 AM EDT ----- Regarding: repeat labs Cbc due in 08-2018 for IDA.  (last done 6 months ago in May)

## 2018-08-30 NOTE — Telephone Encounter (Signed)
I have spoken to patient patient to remind to come for labs next week.  She states that she will probably come on Monday for labs. Orders placed in Epic.

## 2018-09-02 ENCOUNTER — Other Ambulatory Visit (INDEPENDENT_AMBULATORY_CARE_PROVIDER_SITE_OTHER): Payer: 59

## 2018-09-02 DIAGNOSIS — D509 Iron deficiency anemia, unspecified: Secondary | ICD-10-CM

## 2018-09-02 LAB — CBC WITH DIFFERENTIAL/PLATELET
BASOS ABS: 0 10*3/uL (ref 0.0–0.1)
Basophils Relative: 0.7 % (ref 0.0–3.0)
EOS ABS: 0.2 10*3/uL (ref 0.0–0.7)
EOS PCT: 3 % (ref 0.0–5.0)
HCT: 39.7 % (ref 36.0–46.0)
Hemoglobin: 13 g/dL (ref 12.0–15.0)
Lymphocytes Relative: 31.2 % (ref 12.0–46.0)
Lymphs Abs: 2.2 10*3/uL (ref 0.7–4.0)
MCHC: 32.8 g/dL (ref 30.0–36.0)
MCV: 95.2 fl (ref 78.0–100.0)
MONO ABS: 0.6 10*3/uL (ref 0.1–1.0)
Monocytes Relative: 8 % (ref 3.0–12.0)
NEUTROS PCT: 57.1 % (ref 43.0–77.0)
Neutro Abs: 4 10*3/uL (ref 1.4–7.7)
Platelets: 302 10*3/uL (ref 150.0–400.0)
RBC: 4.17 Mil/uL (ref 3.87–5.11)
RDW: 15.1 % (ref 11.5–15.5)
WBC: 7 10*3/uL (ref 4.0–10.5)

## 2018-09-05 ENCOUNTER — Other Ambulatory Visit: Payer: Self-pay | Admitting: Family Medicine

## 2018-09-10 ENCOUNTER — Ambulatory Visit (INDEPENDENT_AMBULATORY_CARE_PROVIDER_SITE_OTHER): Payer: 59 | Admitting: Family Medicine

## 2018-09-10 ENCOUNTER — Encounter: Payer: Self-pay | Admitting: Family Medicine

## 2018-09-10 VITALS — BP 126/78 | HR 71 | Temp 98.1°F | Ht 66.5 in | Wt 203.0 lb

## 2018-09-10 DIAGNOSIS — Z23 Encounter for immunization: Secondary | ICD-10-CM

## 2018-09-10 DIAGNOSIS — Z0000 Encounter for general adult medical examination without abnormal findings: Secondary | ICD-10-CM

## 2018-09-10 DIAGNOSIS — E038 Other specified hypothyroidism: Secondary | ICD-10-CM

## 2018-09-10 DIAGNOSIS — E7800 Pure hypercholesterolemia, unspecified: Secondary | ICD-10-CM

## 2018-09-10 DIAGNOSIS — N181 Chronic kidney disease, stage 1: Secondary | ICD-10-CM | POA: Diagnosis not present

## 2018-09-10 DIAGNOSIS — I10 Essential (primary) hypertension: Secondary | ICD-10-CM

## 2018-09-10 DIAGNOSIS — M109 Gout, unspecified: Secondary | ICD-10-CM | POA: Diagnosis not present

## 2018-09-10 DIAGNOSIS — R7303 Prediabetes: Secondary | ICD-10-CM

## 2018-09-10 LAB — COMPREHENSIVE METABOLIC PANEL
ALBUMIN: 4.4 g/dL (ref 3.5–5.2)
ALK PHOS: 69 U/L (ref 39–117)
ALT: 18 U/L (ref 0–35)
AST: 20 U/L (ref 0–37)
BUN: 17 mg/dL (ref 6–23)
CALCIUM: 9.6 mg/dL (ref 8.4–10.5)
CO2: 29 mEq/L (ref 19–32)
Chloride: 105 mEq/L (ref 96–112)
Creatinine, Ser: 1.15 mg/dL (ref 0.40–1.20)
GFR: 50.79 mL/min — AB (ref >60.00)
Glucose, Bld: 101 mg/dL — ABNORMAL HIGH (ref 70–99)
POTASSIUM: 4.3 meq/L (ref 3.5–5.1)
Sodium: 140 mEq/L (ref 135–145)
Total Bilirubin: 0.5 mg/dL (ref 0.2–1.2)
Total Protein: 7.1 g/dL (ref 6.0–8.3)

## 2018-09-10 LAB — LIPID PANEL
CHOLESTEROL: 135 mg/dL (ref 0–200)
HDL: 47.5 mg/dL (ref >39.00)
LDL Cholesterol: 61 mg/dL (ref 0–99)
NonHDL: 87.24
Total CHOL/HDL Ratio: 3
Triglycerides: 129 mg/dL (ref 0.0–149.0)
VLDL: 25.8 mg/dL (ref 0.0–40.0)

## 2018-09-10 LAB — TSH: TSH: 2.96 u[IU]/mL (ref 0.35–4.50)

## 2018-09-10 LAB — URIC ACID: Uric Acid, Serum: 4 mg/dL (ref 2.4–7.0)

## 2018-09-10 LAB — HEMOGLOBIN A1C: HEMOGLOBIN A1C: 5.9 % (ref 4.6–6.5)

## 2018-09-10 LAB — T4, FREE: FREE T4: 0.79 ng/dL (ref 0.60–1.60)

## 2018-09-10 LAB — T3, FREE: T3 FREE: 3 pg/mL (ref 2.3–4.2)

## 2018-09-10 NOTE — Progress Notes (Signed)
Subjective:    Patient ID: Virginia Wilson, female    DOB: April 17, 1956, 62 y.o.   MRN: [id number]  HPI The patient is here for annual wellness exam and preventative care.    Hypertension:   Good control  On no med.  BP Readings from Last 3 Encounters:  09/10/18 126/78  08/03/17 130/88  07/04/17 122/78  Using medication without problems or lightheadedness: none Chest pain with exertion: none Edema:none Short of breath:none Average home BPs: Other issues:  Hypothyroid .   Due for re-eval.  Gout stable on allopurinol .Marland Kitchen Due for re-eval  Elevated Cholesterol:  LDL at goal on atorvastatin 40 Lab Results  Component Value Date   CHOL 116 06/20/2017   HDL 54.70 06/20/2017   LDLCALC 31 06/20/2017   LDLDIRECT 128.4 06/27/2011   TRIG 151.0 (H) 06/20/2017   CHOLHDL 2 06/20/2017  Using medications without problems: Muscle aches:  Diet compliance: moderate Exercise: not consistently 3 days a week Other complaints:  Wt Readings from Last 3 Encounters:  09/10/18 203 lb (92.1 kg)  08/03/17 196 lb (88.9 kg)  07/04/17 195 lb 1.9 oz (88.5 kg)    CRI:  Due for re-eval prediabetes due for re-eval.  Social History /Family History/Past Medical History reviewed in detail and updated in EMR if needed. Blood pressure 126/78, pulse 71, temperature 98.1 F (36.7 C), temperature source Oral, height 5' 6.5" (1.689 m), weight 203 lb (92.1 kg).  Review of Systems  Constitutional: Negative for fatigue and fever.  HENT: Negative for congestion.   Eyes: Negative for pain.  Respiratory: Negative for cough and shortness of breath.   Cardiovascular: Negative for chest pain, palpitations and leg swelling.  Gastrointestinal: Negative for abdominal pain.  Genitourinary: Negative for dysuria and vaginal bleeding.  Musculoskeletal: Negative for back pain.  Neurological: Negative for syncope, light-headedness and headaches.  Psychiatric/Behavioral: Negative for dysphoric mood.       Objective:   Physical Exam  Constitutional: Vital signs are normal. She appears well-developed and well-nourished. She is cooperative.  Non-toxic appearance. She does not appear ill. No distress.  HENT:  Head: Normocephalic.  Right Ear: Hearing, tympanic membrane, external ear and ear canal normal.  Left Ear: Hearing, tympanic membrane, external ear and ear canal normal.  Nose: Nose normal.  Eyes: Pupils are equal, round, and reactive to light. Conjunctivae, EOM and lids are normal. Lids are everted and swept, no foreign bodies found.  Neck: Trachea normal and normal range of motion. Neck supple. Carotid bruit is not present. No thyroid mass and no thyromegaly present.  Cardiovascular: Normal rate, regular rhythm, S1 normal, S2 normal, normal heart sounds and intact distal pulses. Exam reveals no gallop.  No murmur heard. Possible very quiet systolic murmur hear in RUSB... On further auscultation unable to hear.. Likely respirations.  Pulmonary/Chest: Effort normal and breath sounds normal. No respiratory distress. She has no wheezes. She has no rhonchi. She has no rales.  Abdominal: Soft. Normal appearance and bowel sounds are normal. She exhibits no distension, no fluid wave, no abdominal bruit and no mass. There is no hepatosplenomegaly. There is no tenderness. There is no rebound, no guarding and no CVA tenderness. No hernia.  Lymphadenopathy:    She has no cervical adenopathy.    She has no axillary adenopathy.  Neurological: She is alert. She has normal strength. No cranial nerve deficit or sensory deficit.  Skin: Skin is warm, dry and intact. No rash noted.  Psychiatric: Her speech is normal and behavior is  normal. Judgment normal. Her mood appears not anxious. Cognition and memory are normal. She does not exhibit a depressed mood.          Assessment & Plan:  The patient's preventative maintenance and recommended screening tests for an annual wellness exam were reviewed in full today. Brought  up to date unless services declined.  Counselled on the importance of diet, exercise, and its role in overall health and mortality. The patient's FH and SH was reviewed, including their home life, tobacco status, and drug and alcohol status.   PAP/DVE: 02/2014, nml, no HPV, repeat in 5 years. Asymptomatic , no family history of ovarian or endometrial cancer Mammo: nml 12/2014, plan repeat every 2 years.   DEXA: start at age 7 given low risk. Colon:colonoscopy 06/2017 polyps, tics: repeat in 10 years Vaccines: Uptodate. Refused flu vaccine.  Hep C: neg CBJ:SEGBTDV. Nonsmoker No ETOH use.

## 2018-09-10 NOTE — Assessment & Plan Note (Signed)
Due for re-eval. 

## 2018-09-10 NOTE — Patient Instructions (Signed)
Please stop at the lab to have labs drawn.  

## 2018-09-10 NOTE — Assessment & Plan Note (Signed)
Well controlled. Continue current medication.  

## 2018-09-11 DIAGNOSIS — M9903 Segmental and somatic dysfunction of lumbar region: Secondary | ICD-10-CM | POA: Diagnosis not present

## 2018-09-11 DIAGNOSIS — M955 Acquired deformity of pelvis: Secondary | ICD-10-CM | POA: Diagnosis not present

## 2018-09-11 DIAGNOSIS — M9905 Segmental and somatic dysfunction of pelvic region: Secondary | ICD-10-CM | POA: Diagnosis not present

## 2018-09-11 DIAGNOSIS — M5136 Other intervertebral disc degeneration, lumbar region: Secondary | ICD-10-CM | POA: Diagnosis not present

## 2018-09-13 ENCOUNTER — Other Ambulatory Visit: Payer: Self-pay | Admitting: Family Medicine

## 2018-10-16 DIAGNOSIS — M955 Acquired deformity of pelvis: Secondary | ICD-10-CM | POA: Diagnosis not present

## 2018-10-16 DIAGNOSIS — M9905 Segmental and somatic dysfunction of pelvic region: Secondary | ICD-10-CM | POA: Diagnosis not present

## 2018-10-16 DIAGNOSIS — M9903 Segmental and somatic dysfunction of lumbar region: Secondary | ICD-10-CM | POA: Diagnosis not present

## 2018-10-16 DIAGNOSIS — M5136 Other intervertebral disc degeneration, lumbar region: Secondary | ICD-10-CM | POA: Diagnosis not present

## 2018-10-21 MED ORDER — HYDROCHLOROTHIAZIDE 25 MG PO TABS: 25.0000 mg | ORAL_TABLET | Freq: Every day | ORAL | 3 refills | Status: DC

## 2018-11-04 ENCOUNTER — Other Ambulatory Visit: Payer: Self-pay | Admitting: Family Medicine

## 2018-11-20 DIAGNOSIS — M955 Acquired deformity of pelvis: Secondary | ICD-10-CM | POA: Diagnosis not present

## 2018-11-20 DIAGNOSIS — M9905 Segmental and somatic dysfunction of pelvic region: Secondary | ICD-10-CM | POA: Diagnosis not present

## 2018-11-20 DIAGNOSIS — M5136 Other intervertebral disc degeneration, lumbar region: Secondary | ICD-10-CM | POA: Diagnosis not present

## 2018-11-20 DIAGNOSIS — M9903 Segmental and somatic dysfunction of lumbar region: Secondary | ICD-10-CM | POA: Diagnosis not present

## 2018-11-23 DIAGNOSIS — M9903 Segmental and somatic dysfunction of lumbar region: Secondary | ICD-10-CM | POA: Diagnosis not present

## 2018-11-23 DIAGNOSIS — M955 Acquired deformity of pelvis: Secondary | ICD-10-CM | POA: Diagnosis not present

## 2018-11-23 DIAGNOSIS — M9905 Segmental and somatic dysfunction of pelvic region: Secondary | ICD-10-CM | POA: Diagnosis not present

## 2018-11-23 DIAGNOSIS — M5136 Other intervertebral disc degeneration, lumbar region: Secondary | ICD-10-CM | POA: Diagnosis not present

## 2018-11-26 DIAGNOSIS — M9905 Segmental and somatic dysfunction of pelvic region: Secondary | ICD-10-CM | POA: Diagnosis not present

## 2018-11-26 DIAGNOSIS — M9903 Segmental and somatic dysfunction of lumbar region: Secondary | ICD-10-CM | POA: Diagnosis not present

## 2018-11-26 DIAGNOSIS — M5136 Other intervertebral disc degeneration, lumbar region: Secondary | ICD-10-CM | POA: Diagnosis not present

## 2018-11-26 DIAGNOSIS — M955 Acquired deformity of pelvis: Secondary | ICD-10-CM | POA: Diagnosis not present

## 2018-11-29 ENCOUNTER — Other Ambulatory Visit: Payer: Self-pay | Admitting: Family Medicine

## 2018-12-02 DIAGNOSIS — M5136 Other intervertebral disc degeneration, lumbar region: Secondary | ICD-10-CM | POA: Diagnosis not present

## 2018-12-02 DIAGNOSIS — M955 Acquired deformity of pelvis: Secondary | ICD-10-CM | POA: Diagnosis not present

## 2018-12-02 DIAGNOSIS — M9905 Segmental and somatic dysfunction of pelvic region: Secondary | ICD-10-CM | POA: Diagnosis not present

## 2018-12-02 DIAGNOSIS — M9903 Segmental and somatic dysfunction of lumbar region: Secondary | ICD-10-CM | POA: Diagnosis not present

## 2018-12-05 DIAGNOSIS — M955 Acquired deformity of pelvis: Secondary | ICD-10-CM | POA: Diagnosis not present

## 2018-12-05 DIAGNOSIS — M9905 Segmental and somatic dysfunction of pelvic region: Secondary | ICD-10-CM | POA: Diagnosis not present

## 2018-12-05 DIAGNOSIS — M5136 Other intervertebral disc degeneration, lumbar region: Secondary | ICD-10-CM | POA: Diagnosis not present

## 2018-12-05 DIAGNOSIS — M9903 Segmental and somatic dysfunction of lumbar region: Secondary | ICD-10-CM | POA: Diagnosis not present

## 2018-12-16 DIAGNOSIS — M9903 Segmental and somatic dysfunction of lumbar region: Secondary | ICD-10-CM | POA: Diagnosis not present

## 2018-12-16 DIAGNOSIS — M5136 Other intervertebral disc degeneration, lumbar region: Secondary | ICD-10-CM | POA: Diagnosis not present

## 2018-12-16 DIAGNOSIS — M9905 Segmental and somatic dysfunction of pelvic region: Secondary | ICD-10-CM | POA: Diagnosis not present

## 2018-12-16 DIAGNOSIS — M955 Acquired deformity of pelvis: Secondary | ICD-10-CM | POA: Diagnosis not present

## 2018-12-23 ENCOUNTER — Other Ambulatory Visit: Payer: Self-pay | Admitting: *Deleted

## 2018-12-23 DIAGNOSIS — M9905 Segmental and somatic dysfunction of pelvic region: Secondary | ICD-10-CM | POA: Diagnosis not present

## 2018-12-23 DIAGNOSIS — M955 Acquired deformity of pelvis: Secondary | ICD-10-CM | POA: Diagnosis not present

## 2018-12-23 DIAGNOSIS — M9903 Segmental and somatic dysfunction of lumbar region: Secondary | ICD-10-CM | POA: Diagnosis not present

## 2018-12-23 DIAGNOSIS — M5136 Other intervertebral disc degeneration, lumbar region: Secondary | ICD-10-CM | POA: Diagnosis not present

## 2018-12-23 MED ORDER — HYDROCHLOROTHIAZIDE 25 MG PO TABS: 25.0000 mg | ORAL_TABLET | Freq: Every day | ORAL | 3 refills | Status: DC

## 2019-05-13 DIAGNOSIS — M955 Acquired deformity of pelvis: Secondary | ICD-10-CM | POA: Diagnosis not present

## 2019-05-13 DIAGNOSIS — M9903 Segmental and somatic dysfunction of lumbar region: Secondary | ICD-10-CM | POA: Diagnosis not present

## 2019-05-13 DIAGNOSIS — M9905 Segmental and somatic dysfunction of pelvic region: Secondary | ICD-10-CM | POA: Diagnosis not present

## 2019-05-13 DIAGNOSIS — M5136 Other intervertebral disc degeneration, lumbar region: Secondary | ICD-10-CM | POA: Diagnosis not present

## 2019-06-17 DIAGNOSIS — M955 Acquired deformity of pelvis: Secondary | ICD-10-CM | POA: Diagnosis not present

## 2019-06-17 DIAGNOSIS — M9905 Segmental and somatic dysfunction of pelvic region: Secondary | ICD-10-CM | POA: Diagnosis not present

## 2019-06-17 DIAGNOSIS — M5136 Other intervertebral disc degeneration, lumbar region: Secondary | ICD-10-CM | POA: Diagnosis not present

## 2019-06-17 DIAGNOSIS — M9903 Segmental and somatic dysfunction of lumbar region: Secondary | ICD-10-CM | POA: Diagnosis not present

## 2019-06-30 ENCOUNTER — Ambulatory Visit: Payer: 59 | Admitting: Family Medicine

## 2019-06-30 ENCOUNTER — Other Ambulatory Visit: Payer: Self-pay

## 2019-06-30 VITALS — BP 138/94 | HR 70 | Temp 97.7°F | Ht 66.5 in | Wt 200.5 lb

## 2019-06-30 DIAGNOSIS — R1031 Right lower quadrant pain: Secondary | ICD-10-CM

## 2019-06-30 NOTE — Progress Notes (Signed)
Virginia Jacinto T. Lei Dower, MD Primary Care and [place] at [facility], [address] Phone: [phone]  FAX: [place] - 63 y.o. female  MRN [id number]  Date of Birth: 1956/04/09  Visit Date: 06/30/2019  PCP: Jinny Sanders, MD  Referred by: Jinny Sanders, MD  Chief Complaint  Patient presents with  . Pelvic Pain    right side. Started yesterday. No urinary discomfort.   Subjective:   Virginia Wilson is a 63 y.o. very pleasant female patient who presents with the following:  Patient presents with RLQ pain.  She is eating and drinking okay, and she is not having any fever.  She is not having any nausea or vomiting.  She is denies any sort of fever, chills, diarrhea.  She does not have any dysuria urgency.  No new rashes or neurological complaints.  Globally, she is having right lower quadrant pain.  Eating and drinking ok.  Mostly pain.  No period.  No appy, no hyst. L ovary removed in 1996.  Past Medical History, Surgical History, Social History, Family History, Problem List, Medications, and Allergies have been reviewed and updated if relevant.  Patient Active Problem List   Diagnosis Date Noted  . Angina pectoris ([place]) 06/26/2017  . Right lower quadrant abdominal pain 06/26/2017  . Iron deficiency anemia 06/05/2017  . Right foot pain 05/17/2017  . Insomnia 08/22/2016  . Prediabetes 02/23/2015  . Chronic renal insufficiency, stage I 02/06/2014  . Gout with manifestations 02/23/2011  . PSORIASIS, SCALP 12/02/2010  . Hypothyroidism 05/22/2007  . HYPERCHOLESTEROLEMIA 01/21/2007  . Essential hypertension, benign 01/21/2007  . ALLERGIC RHINITIS 01/21/2007  . ASTHMA, CHILDHOOD 01/21/2007  . GERD 01/21/2007  . MIGRAINE, MENSTRUAL 01/21/2007  . MENOPAUSAL SYNDROME 01/21/2007    Past Medical History:  Diagnosis Date  . Allergic rhinitis, cause unspecified   . Anemia   . Chronic headaches   .  Chronic kidney disease, unspecified    per pt, no kidney disease  . Esophageal reflux   . Extrinsic asthma, unspecified    per pt, does not have  . Family history of ischemic heart disease   . Obesity, unspecified   . Premenstrual tension syndromes   . Pure hypercholesterolemia   . SOB (shortness of breath) on exertion   . Symptomatic menopausal or female climacteric states   . Unspecified essential hypertension   . Unspecified hypothyroidism     Past Surgical History:  Procedure Laterality Date  . CARDIOVASCULAR STRESS TEST     done on 06/19/17/was normal.  . ovarian repair Left   . TONSILLECTOMY      Social History   Socioeconomic History  . Marital status: Widowed    Spouse name: Not on file  . Number of children: 2  . Years of education: Not on file  . Highest education level: Not on file  Occupational History  . Occupation: Psychologist, forensic: [employer]  . Financial resource strain: Not on file  . Food insecurity    Worry: Not on file    Inability: Not on file  . Transportation needs    Medical: Not on file    Non-medical: Not on file  Tobacco Use  . Smoking status: Never Smoker  . Smokeless tobacco: Never Used  Substance and Sexual Activity  . Alcohol use: No  . Drug use: No  . Sexual activity: Not on file  Lifestyle  . Physical activity    Days per week: Not on file    Minutes per session: Not on file  . Stress: Not on file  Relationships  . Social Herbalist on phone: Not on file    Gets together: Not on file    Attends religious service: Not on file    Active member of club or organization: Not on file    Attends meetings of clubs or organizations: Not on file    Relationship status: Not on file  . Intimate partner violence    Fear of current or ex partner: Not on file    Emotionally abused: Not on file    Physically abused: Not on file    Forced sexual activity: Not on file  Other Topics Concern  .  Not on file  Social History Narrative  . Not on file    Family History  Problem Relation Age of Onset  . Hypertension Mother   . Diabetes Mother   . Stroke Mother   . Diabetes Father   . Coronary artery disease Father   . Heart attack Father   . Hypertension Brother   . Hypertension Brother   . Breast cancer Neg Hx     Allergies  Allergen Reactions  . Sulfonamide Derivatives     REACTION: unspecified  . Penicillins     Has patient had a PCN reaction causing immediate rash, facial/tongue/throat swelling, SOB or lightheadedness with hypotension: Unknown Has patient had a PCN reaction causing severe rash involving mucus membranes or skin necrosis: Unknown Has patient had a PCN reaction that required hospitalization: no Has patient had a PCN reaction occurring within the last 10 years: No If all of the above answers are "NO", then may proceed with Cephalosporin use.     Medication list reviewed and updated in full in [place].    GEN: RLQ pain as above GI: No n/v/d, eating normally Pulm: No SOB Interactive and getting along well at home.  Otherwise, ROS is as per the HPI.  Objective:   BP (!) 138/94   Pulse 70   Temp 97.7 F (36.5 C)   Ht 5' 6.5" (1.689 m)   Wt 200 lb 8 oz (90.9 kg)   SpO2 96%   BMI 31.88 kg/m   GEN: WDWN, NAD, Non-toxic, A & O x 3 HEENT: Atraumatic, Normocephalic. Neck supple. No masses, No LAD. Ears and Nose: No external deformity. CV: RRR, No M/G/R. No JVD. No thrill. No extra heart sounds. PULM: CTA B, no wheezes, crackles, rhonchi. No retractions. No resp. distress. No accessory muscle use. EXTR: No c/c/e NEURO Normal gait.  PSYCH: Normally interactive. Conversant. Not depressed or anxious appearing.  Calm demeanor.  ABD: S, tenderness isolated to the RLQ, ND, + BS, No rebound, No HSM  GYN: This portion of the physical examination was chaperoned by Hedy Camara, CMA.  The patient does have a normal introitus.  There is no  external rash.  She is not having any pain at the cervix or in the hypogastric region of the left lower quadrant.  Her pain does isolate to the right lower quadrant.  Laboratory and Imaging Data:  Assessment and Plan:     ICD-10-CM   1. RLQ abdominal pain  [id number] Basic metabolic panel    CBC with Differential/Platelet    Hepatic function panel    Lipase    US PELVIC COMPLETE WITH TRANSVAGINAL   The patient does have  right lower quadrant pain of unclear etiology.  We will check basic abdominal pain labs as above.  I think it is a reasonable next step to obtain a pelvic ultrasound to evaluate for issues with the right ovary or other intraabdominal process.  She understands that if her pain heightens over the night or at any point, she should see emergent care in the ER.  Follow-up: No follow-ups on file.  No orders of the defined types were placed in this encounter.  Orders Placed This Encounter  Procedures  . US PELVIC COMPLETE WITH TRANSVAGINAL  . Basic metabolic panel  . CBC with Differential/Platelet  . Hepatic function panel  . Lipase    Signed,  Rahma Meller T. Jalysa Swopes, MD   Outpatient Encounter Medications as of 06/30/2019  Medication Sig  . allopurinol (ZYLOPRIM) 300 MG tablet TAKE 1 TABLET BY MOUTH  DAILY  . atorvastatin (LIPITOR) 40 MG tablet TAKE 1 TABLET BY MOUTH  DAILY  . betamethasone dipropionate (DIPROLENE) 0.05 % cream Apply topically 2 (two) times daily.  Marland Kitchen EPINEPHrine 0.3 mg/0.3 mL IJ SOAJ injection   . ferrous sulfate 325 (65 FE) MG tablet Take 325 mg by mouth daily.   . fluocinonide (LIDEX) 0.05 % external solution Apply 1 application topically daily as needed.  . hydrochlorothiazide (HYDRODIURIL) 25 MG tablet Take 1 tablet (25 mg total) by mouth daily.  . isosorbide mononitrate (IMDUR) 30 MG 24 hr tablet Take 1 tablet (30 mg total) by mouth daily.  Marland Kitchen levothyroxine (SYNTHROID, LEVOTHROID) 25 MCG tablet TAKE 1 TABLET BY MOUTH  DAILY BEFORE BREAKFAST  .  Omega-3 Fatty Acids (FISH OIL) 1200 MG CAPS Take 1 capsule by mouth daily.  Marland Kitchen omeprazole (PRILOSEC) 40 MG capsule Take 40 mg by mouth daily.  . nitroGLYCERIN (NITROSTAT) 0.4 MG SL tablet Place 1 tablet (0.4 mg total) under the tongue every 5 (five) minutes as needed for chest pain.   Facility-Administered Encounter Medications as of 06/30/2019  Medication  . 0.9 %  sodium chloride infusion

## 2019-07-01 ENCOUNTER — Encounter: Payer: Self-pay | Admitting: Family Medicine

## 2019-07-01 LAB — CBC WITH DIFFERENTIAL/PLATELET
Basophils Absolute: 0.2 10*3/uL — ABNORMAL HIGH (ref 0.0–0.1)
Basophils Relative: 2.8 % (ref 0.0–3.0)
Eosinophils Absolute: 0.1 10*3/uL (ref 0.0–0.7)
Eosinophils Relative: 1.4 % (ref 0.0–5.0)
HCT: 39.2 % (ref 36.0–46.0)
Hemoglobin: 13.1 g/dL (ref 12.0–15.0)
Lymphocytes Relative: 24.1 % (ref 12.0–46.0)
Lymphs Abs: 2.1 10*3/uL (ref 0.7–4.0)
MCHC: 33.3 g/dL (ref 30.0–36.0)
MCV: 93.8 fl (ref 78.0–100.0)
Monocytes Absolute: 0.4 10*3/uL (ref 0.1–1.0)
Monocytes Relative: 4.9 % (ref 3.0–12.0)
Neutro Abs: 5.8 10*3/uL (ref 1.4–7.7)
Neutrophils Relative %: 66.8 % (ref 43.0–77.0)
Platelets: 322 10*3/uL (ref 150.0–400.0)
RBC: 4.18 Mil/uL (ref 3.87–5.11)
RDW: 14.8 % (ref 11.5–15.5)
WBC: 8.7 10*3/uL (ref 4.0–10.5)

## 2019-07-01 LAB — HEPATIC FUNCTION PANEL
ALT: 20 U/L (ref 0–35)
AST: 20 U/L (ref 0–37)
Albumin: 4.3 g/dL (ref 3.5–5.2)
Alkaline Phosphatase: 73 U/L (ref 39–117)
Bilirubin, Direct: 0 mg/dL (ref 0.0–0.3)
Total Bilirubin: 0.4 mg/dL (ref 0.2–1.2)
Total Protein: 6.9 g/dL (ref 6.0–8.3)

## 2019-07-01 LAB — BASIC METABOLIC PANEL
BUN: 22 mg/dL (ref 6–23)
CO2: 31 mEq/L (ref 19–32)
Calcium: 9.6 mg/dL (ref 8.4–10.5)
Chloride: 101 mEq/L (ref 96–112)
Creatinine, Ser: 1.15 mg/dL (ref 0.40–1.20)
GFR: 47.66 mL/min — ABNORMAL LOW (ref >60.00)
Glucose, Bld: 86 mg/dL (ref 70–99)
Potassium: 3.8 mEq/L (ref 3.5–5.1)
Sodium: 139 mEq/L (ref 135–145)

## 2019-07-01 LAB — LIPASE: Lipase: 53 U/L (ref 11.0–59.0)

## 2019-07-08 ENCOUNTER — Ambulatory Visit
Admission: RE | Admit: 2019-07-08 | Discharge: 2019-07-08 | Disposition: A | Discharge status: Home / Self Care | Payer: 59 | Source: Ambulatory Visit | Attending: Family Medicine | Admitting: Family Medicine

## 2019-07-08 ENCOUNTER — Other Ambulatory Visit: Payer: Self-pay

## 2019-07-08 DIAGNOSIS — R1031 Right lower quadrant pain: Secondary | ICD-10-CM | POA: Diagnosis not present

## 2019-07-08 DIAGNOSIS — N858 Other specified noninflammatory disorders of uterus: Secondary | ICD-10-CM | POA: Diagnosis not present

## 2019-07-11 ENCOUNTER — Encounter: Payer: Self-pay | Admitting: Family Medicine

## 2019-07-11 NOTE — Telephone Encounter (Signed)
Pt called checking on her ultra sound results  Best number [phone]

## 2019-07-15 DIAGNOSIS — M5136 Other intervertebral disc degeneration, lumbar region: Secondary | ICD-10-CM | POA: Diagnosis not present

## 2019-07-15 DIAGNOSIS — M9905 Segmental and somatic dysfunction of pelvic region: Secondary | ICD-10-CM | POA: Diagnosis not present

## 2019-07-15 DIAGNOSIS — M955 Acquired deformity of pelvis: Secondary | ICD-10-CM | POA: Diagnosis not present

## 2019-07-15 DIAGNOSIS — M9903 Segmental and somatic dysfunction of lumbar region: Secondary | ICD-10-CM | POA: Diagnosis not present

## 2019-08-12 DIAGNOSIS — M5136 Other intervertebral disc degeneration, lumbar region: Secondary | ICD-10-CM | POA: Diagnosis not present

## 2019-08-12 DIAGNOSIS — M955 Acquired deformity of pelvis: Secondary | ICD-10-CM | POA: Diagnosis not present

## 2019-08-12 DIAGNOSIS — M9903 Segmental and somatic dysfunction of lumbar region: Secondary | ICD-10-CM | POA: Diagnosis not present

## 2019-08-12 DIAGNOSIS — M9905 Segmental and somatic dysfunction of pelvic region: Secondary | ICD-10-CM | POA: Diagnosis not present

## 2019-08-15 ENCOUNTER — Telehealth: Payer: Self-pay | Admitting: Family Medicine

## 2019-08-18 NOTE — Telephone Encounter (Signed)
Please schedule CPE with fasting labs for Dr. Bedsole. 

## 2019-08-25 NOTE — Telephone Encounter (Signed)
12/9 cpx 12/11 Pt aware

## 2019-09-09 DIAGNOSIS — M9905 Segmental and somatic dysfunction of pelvic region: Secondary | ICD-10-CM | POA: Diagnosis not present

## 2019-09-09 DIAGNOSIS — M5136 Other intervertebral disc degeneration, lumbar region: Secondary | ICD-10-CM | POA: Diagnosis not present

## 2019-09-09 DIAGNOSIS — M955 Acquired deformity of pelvis: Secondary | ICD-10-CM | POA: Diagnosis not present

## 2019-09-09 DIAGNOSIS — M9903 Segmental and somatic dysfunction of lumbar region: Secondary | ICD-10-CM | POA: Diagnosis not present

## 2019-09-16 ENCOUNTER — Telehealth: Payer: Self-pay | Admitting: Family Medicine

## 2019-09-16 ENCOUNTER — Other Ambulatory Visit: Payer: Self-pay | Admitting: Family Medicine

## 2019-09-16 DIAGNOSIS — M109 Gout, unspecified: Secondary | ICD-10-CM

## 2019-09-16 DIAGNOSIS — R7303 Prediabetes: Secondary | ICD-10-CM

## 2019-09-16 DIAGNOSIS — D509 Iron deficiency anemia, unspecified: Secondary | ICD-10-CM

## 2019-09-16 DIAGNOSIS — Z0000 Encounter for general adult medical examination without abnormal findings: Secondary | ICD-10-CM

## 2019-09-16 DIAGNOSIS — E7800 Pure hypercholesterolemia, unspecified: Secondary | ICD-10-CM

## 2019-09-16 DIAGNOSIS — E038 Other specified hypothyroidism: Secondary | ICD-10-CM

## 2019-09-16 NOTE — Telephone Encounter (Signed)
-----   Message from Ellamae Sia sent at 09/11/2019 12:00 PM EST ----- Regarding: lab orders for Wednesday, 12.9.20 Patient is scheduled for CPX labs, please order future labs, Thanks , Karna Christmas

## 2019-09-17 ENCOUNTER — Other Ambulatory Visit (INDEPENDENT_AMBULATORY_CARE_PROVIDER_SITE_OTHER): Payer: 59

## 2019-09-17 ENCOUNTER — Other Ambulatory Visit: Payer: Self-pay

## 2019-09-17 DIAGNOSIS — E7800 Pure hypercholesterolemia, unspecified: Secondary | ICD-10-CM

## 2019-09-17 DIAGNOSIS — E038 Other specified hypothyroidism: Secondary | ICD-10-CM

## 2019-09-17 DIAGNOSIS — R7303 Prediabetes: Secondary | ICD-10-CM | POA: Diagnosis not present

## 2019-09-17 DIAGNOSIS — Z0000 Encounter for general adult medical examination without abnormal findings: Secondary | ICD-10-CM

## 2019-09-17 DIAGNOSIS — M109 Gout, unspecified: Secondary | ICD-10-CM | POA: Diagnosis not present

## 2019-09-17 LAB — LIPID PANEL
Cholesterol: 172 mg/dL (ref 0–200)
HDL: 52.7 mg/dL (ref >39.00)
NonHDL: 119.44
Total CHOL/HDL Ratio: 3
Triglycerides: 261 mg/dL — ABNORMAL HIGH (ref 0.0–149.0)
VLDL: 52.2 mg/dL — ABNORMAL HIGH (ref 0.0–40.0)

## 2019-09-17 LAB — CBC WITH DIFFERENTIAL/PLATELET
Basophils Absolute: 0 10*3/uL (ref 0.0–0.1)
Basophils Relative: 0.6 % (ref 0.0–3.0)
Eosinophils Absolute: 0.2 10*3/uL (ref 0.0–0.7)
Eosinophils Relative: 2.2 % (ref 0.0–5.0)
HCT: 40.3 % (ref 36.0–46.0)
Hemoglobin: 13.1 g/dL (ref 12.0–15.0)
Lymphocytes Relative: 25.6 % (ref 12.0–46.0)
Lymphs Abs: 1.8 10*3/uL (ref 0.7–4.0)
MCHC: 32.6 g/dL (ref 30.0–36.0)
MCV: 94 fl (ref 78.0–100.0)
Monocytes Absolute: 0.5 10*3/uL (ref 0.1–1.0)
Monocytes Relative: 6.7 % (ref 3.0–12.0)
Neutro Abs: 4.6 10*3/uL (ref 1.4–7.7)
Neutrophils Relative %: 64.9 % (ref 43.0–77.0)
Platelets: 332 10*3/uL (ref 150.0–400.0)
RBC: 4.29 Mil/uL (ref 3.87–5.11)
RDW: 15.5 % (ref 11.5–15.5)
WBC: 7.1 10*3/uL (ref 4.0–10.5)

## 2019-09-17 LAB — COMPREHENSIVE METABOLIC PANEL
ALT: 33 U/L (ref 0–35)
AST: 34 U/L (ref 0–37)
Albumin: 4.4 g/dL (ref 3.5–5.2)
Alkaline Phosphatase: 85 U/L (ref 39–117)
BUN: 21 mg/dL (ref 6–23)
CO2: 32 mEq/L (ref 19–32)
Calcium: 9.6 mg/dL (ref 8.4–10.5)
Chloride: 101 mEq/L (ref 96–112)
Creatinine, Ser: 1.13 mg/dL (ref 0.40–1.20)
GFR: 48.6 mL/min — ABNORMAL LOW (ref >60.00)
Glucose, Bld: 99 mg/dL (ref 70–99)
Potassium: 4.1 mEq/L (ref 3.5–5.1)
Sodium: 140 mEq/L (ref 135–145)
Total Bilirubin: 0.5 mg/dL (ref 0.2–1.2)
Total Protein: 7 g/dL (ref 6.0–8.3)

## 2019-09-17 LAB — TSH: TSH: 4.27 u[IU]/mL (ref 0.35–4.50)

## 2019-09-17 LAB — LDL CHOLESTEROL, DIRECT: Direct LDL: 36 mg/dL

## 2019-09-17 LAB — URIC ACID: Uric Acid, Serum: 4.7 mg/dL (ref 2.4–7.0)

## 2019-09-17 LAB — HEMOGLOBIN A1C: Hgb A1c MFr Bld: 5.9 % (ref 4.6–6.5)

## 2019-09-18 NOTE — Progress Notes (Signed)
No critical labs need to be addressed urgently. We will discuss labs in detail at upcoming office visit.   

## 2019-09-19 ENCOUNTER — Ambulatory Visit (INDEPENDENT_AMBULATORY_CARE_PROVIDER_SITE_OTHER): Payer: 59 | Admitting: Family Medicine

## 2019-09-19 ENCOUNTER — Other Ambulatory Visit (HOSPITAL_COMMUNITY)
Admission: RE | Admit: 2019-09-19 | Discharge: 2019-09-19 | Disposition: A | Discharge status: Home / Self Care | Payer: 59 | Source: Ambulatory Visit | Attending: Family Medicine | Admitting: Family Medicine

## 2019-09-19 ENCOUNTER — Other Ambulatory Visit: Payer: Self-pay

## 2019-09-19 ENCOUNTER — Encounter: Payer: Self-pay | Admitting: Family Medicine

## 2019-09-19 VITALS — BP 130/86 | HR 68 | Temp 98.2°F | Ht 66.5 in | Wt 200.5 lb

## 2019-09-19 DIAGNOSIS — N181 Chronic kidney disease, stage 1: Secondary | ICD-10-CM

## 2019-09-19 DIAGNOSIS — M109 Gout, unspecified: Secondary | ICD-10-CM

## 2019-09-19 DIAGNOSIS — E7800 Pure hypercholesterolemia, unspecified: Secondary | ICD-10-CM

## 2019-09-19 DIAGNOSIS — D509 Iron deficiency anemia, unspecified: Secondary | ICD-10-CM

## 2019-09-19 DIAGNOSIS — Z0000 Encounter for general adult medical examination without abnormal findings: Secondary | ICD-10-CM | POA: Insufficient documentation

## 2019-09-19 DIAGNOSIS — I10 Essential (primary) hypertension: Secondary | ICD-10-CM | POA: Diagnosis not present

## 2019-09-19 DIAGNOSIS — R7303 Prediabetes: Secondary | ICD-10-CM

## 2019-09-19 DIAGNOSIS — E038 Other specified hypothyroidism: Secondary | ICD-10-CM

## 2019-09-19 MED ORDER — FLUOCINONIDE 0.05 % EX SOLN: 1.0000 "application " | Freq: Every day | CUTANEOUS | 3 refills | Status: AC | PRN

## 2019-09-19 NOTE — Assessment & Plan Note (Signed)
Stable on allopurinol

## 2019-09-19 NOTE — Progress Notes (Signed)
Chief Complaint  Patient presents with  . Annual Exam    History of Present Illness: HPI The patient is here for annual wellness exam and preventative care.    Hypertension:   Good control  On HCTZ.  BP Readings from Last 3 Encounters:  09/19/19 130/86  06/30/19 (!) 138/94  09/10/18 126/78   Using medication without problems or lightheadedness: none Chest pain with exertion: none Edema:none Short of breath:none Average home BPs: Other issues:  Hypothyroid   Lab Results  Component Value Date   TSH 4.27 09/17/2019     Gout stable on allopurinol ... no flares in last year.  Elevated Cholesterol:  LDL at goal on atorvastatin 40 Lab Results  Component Value Date   CHOL 172 09/17/2019   HDL 52.70 09/17/2019   LDLCALC 61 09/10/2018   LDLDIRECT 36.0 09/17/2019   TRIG 261.0 (H) 09/17/2019   CHOLHDL 3 09/17/2019  Using medications without problems:none Muscle aches: none Diet compliance: moderate Exercise: 3 days a week Other complaints:  Wt Readings from Last 3 Encounters:  09/19/19 200 lb 8 oz (90.9 kg)  06/30/19 200 lb 8 oz (90.9 kg)  09/10/18 203 lb (92.1 kg)     Prediabetes  Lab Results  Component Value Date   HGBA1C 5.9 09/17/2019      iron def anemia: Resolved on daily iron  This visit occurred during the SARS-CoV-2 public health emergency.  Safety protocols were in place, including screening questions prior to the visit, additional usage of staff PPE, and extensive cleaning of exam room while observing appropriate contact time as indicated for disinfecting solutions.   COVID 19 screen:  No recent travel or known exposure to COVID19 The patient denies respiratory symptoms of COVID 19 at this time. The importance of social distancing was discussed today.     Review of Systems  Constitutional: Negative for chills and fever.  HENT: Negative for congestion and ear pain.   Eyes: Negative for pain and redness.  Respiratory: Negative for cough and  shortness of breath.   Cardiovascular: Negative for chest pain, palpitations and leg swelling.  Gastrointestinal: Negative for abdominal pain, blood in stool, constipation, diarrhea, nausea and vomiting.  Genitourinary: Negative for dysuria.  Musculoskeletal: Negative for falls and myalgias.  Skin: Negative for rash.  Neurological: Negative for dizziness.  Psychiatric/Behavioral: Negative for depression. The patient is not nervous/anxious.       Past Medical History:  Diagnosis Date  . Allergic rhinitis, cause unspecified   . Anemia   . Chronic headaches   . Chronic kidney disease, unspecified    per pt, no kidney disease  . Esophageal reflux   . Extrinsic asthma, unspecified    per pt, does not have  . Family history of ischemic heart disease   . Obesity, unspecified   . Premenstrual tension syndromes   . Pure hypercholesterolemia   . SOB (shortness of breath) on exertion   . Symptomatic menopausal or female climacteric states   . Unspecified essential hypertension   . Unspecified hypothyroidism     reports that she has never smoked. She has never used smokeless tobacco. She reports that she does not drink alcohol or use drugs.   Current Outpatient Medications:  .  allopurinol (ZYLOPRIM) 300 MG tablet, TAKE 1 TABLET BY MOUTH  DAILY, Disp: 90 tablet, Rfl: 3 .  atorvastatin (LIPITOR) 40 MG tablet, TAKE 1 TABLET BY MOUTH  DAILY, Disp: 90 tablet, Rfl: 3 .  betamethasone dipropionate (DIPROLENE) 0.05 % cream, Apply topically  2 (two) times daily., Disp: 15 g, Rfl: 0 .  EPINEPHrine 0.3 mg/0.3 mL IJ SOAJ injection, , Disp: , Rfl:  .  ferrous sulfate 325 (65 FE) MG tablet, Take 325 mg by mouth daily. , Disp: , Rfl:  .  fluocinonide (LIDEX) 0.05 % external solution, Apply 1 application topically daily as needed., Disp: 60 mL, Rfl: 3 .  hydrochlorothiazide (HYDRODIURIL) 25 MG tablet, Take 1 tablet (25 mg total) by mouth daily., Disp: 90 tablet, Rfl: 3 .  levothyroxine (SYNTHROID) 25 MCG  tablet, TAKE 1 TABLET BY MOUTH  DAILY BEFORE BREAKFAST, Disp: 90 tablet, Rfl: 0 .  Omega-3 Fatty Acids (FISH OIL) 1200 MG CAPS, Take 1 capsule by mouth daily., Disp: , Rfl:  .  omeprazole (PRILOSEC) 40 MG capsule, Take 40 mg by mouth daily., Disp: , Rfl:  .  nitroGLYCERIN (NITROSTAT) 0.4 MG SL tablet, Place 1 tablet (0.4 mg total) under the tongue every 5 (five) minutes as needed for chest pain., Disp: 25 tablet, Rfl: 1  Current Facility-Administered Medications:  .  0.9 %  sodium chloride infusion, 500 mL, Intravenous, Continuous, Armbruster, Carlota Raspberry, MD   Observations/Objective: Blood pressure 130/86, pulse 68, temperature 98.2 F (36.8 C), temperature source Temporal, height 5' 6.5" (1.689 m), weight 200 lb 8 oz (90.9 kg), SpO2 98 %.  Physical Exam Constitutional:      General: She is not in acute distress.    Appearance: Normal appearance. She is well-developed. She is not ill-appearing or toxic-appearing.  HENT:     Head: Normocephalic.     Right Ear: Hearing, tympanic membrane, ear canal and external ear normal.     Left Ear: Hearing, tympanic membrane, ear canal and external ear normal.     Nose: Nose normal.  Eyes:     General: Lids are normal. Lids are everted, no foreign bodies appreciated.     Conjunctiva/sclera: Conjunctivae normal.     Pupils: Pupils are equal, round, and reactive to light.  Neck:     Thyroid: No thyroid mass or thyromegaly.     Vascular: No carotid bruit.     Trachea: Trachea normal.  Cardiovascular:     Rate and Rhythm: Normal rate and regular rhythm.     Heart sounds: Normal heart sounds, S1 normal and S2 normal. No murmur. No gallop.   Pulmonary:     Effort: Pulmonary effort is normal. No respiratory distress.     Breath sounds: Normal breath sounds. No wheezing, rhonchi or rales.  Abdominal:     General: Bowel sounds are normal. There is no distension or abdominal bruit.     Palpations: Abdomen is soft. There is no fluid wave or mass.      Tenderness: There is no abdominal tenderness. There is no guarding or rebound.     Hernia: No hernia is present.  Musculoskeletal:     Cervical back: Normal range of motion and neck supple.  Lymphadenopathy:     Cervical: No cervical adenopathy.  Skin:    General: Skin is warm and dry.     Findings: No rash.  Neurological:     Mental Status: She is alert.     Cranial Nerves: No cranial nerve deficit.     Sensory: No sensory deficit.  Psychiatric:        Mood and Affect: Mood is not anxious or depressed.        Speech: Speech normal.        Behavior: Behavior normal. Behavior is cooperative.  Judgment: Judgment normal.      Assessment and Plan The patient's preventative maintenance and recommended screening tests for an annual wellness exam were reviewed in full today. Brought up to date unless services declined.  Counselled on the importance of diet, exercise, and its role in overall health and mortality. The patient's FH and SH was reviewed, including their home life, tobacco status, and drug and alcohol status.   PAP/DVE: 02/2014, nml, no HPV,  Done today.  Asymptomatic , no family history of ovarian or endometrial cancer Mammo: nml 2018, plan repeat every 2 years.  DEXA: start at age 53 given low risk. Colon:colonoscopy9/2018 polyps, tics: repeat in 5 years Vaccines: Uptodate.Refused flu vaccine. Hep C: neg [id number]. Nonsmoker No ETOH use.   Essential hypertension, benign Well controlled. Continue current medication.   Gout with manifestations  Stable on  allopurinol  Hypothyroidism Well controlled. Continue current medication.   Iron deficiency anemia Neg GI eval. Stable on daily iron.  Prediabetes  Stable control.    Eliezer Lofts, MD

## 2019-09-19 NOTE — Assessment & Plan Note (Signed)
Neg GI eval. Stable on daily iron.

## 2019-09-19 NOTE — Assessment & Plan Note (Signed)
Well controlled. Continue current medication.  

## 2019-09-19 NOTE — Patient Instructions (Addendum)
Call to set up mammogram on yor own.  Work on [employer] and regular exercise.

## 2019-09-19 NOTE — Assessment & Plan Note (Signed)
Stable control. 

## 2019-09-22 LAB — CYTOLOGY - PAP
Adequacy: ABSENT
Comment: NEGATIVE
Diagnosis: NEGATIVE
High risk HPV: NEGATIVE

## 2019-10-07 ENCOUNTER — Other Ambulatory Visit: Payer: Self-pay | Admitting: Family Medicine

## 2019-10-07 DIAGNOSIS — M9905 Segmental and somatic dysfunction of pelvic region: Secondary | ICD-10-CM | POA: Diagnosis not present

## 2019-10-07 DIAGNOSIS — M5136 Other intervertebral disc degeneration, lumbar region: Secondary | ICD-10-CM | POA: Diagnosis not present

## 2019-10-07 DIAGNOSIS — M9903 Segmental and somatic dysfunction of lumbar region: Secondary | ICD-10-CM | POA: Diagnosis not present

## 2019-10-07 DIAGNOSIS — M955 Acquired deformity of pelvis: Secondary | ICD-10-CM | POA: Diagnosis not present

## 2019-10-18 ENCOUNTER — Other Ambulatory Visit: Payer: Self-pay | Admitting: Family Medicine

## 2019-10-28 ENCOUNTER — Other Ambulatory Visit: Payer: Self-pay | Admitting: Family Medicine

## 2019-11-04 ENCOUNTER — Other Ambulatory Visit: Payer: Self-pay | Admitting: Family Medicine

## 2019-11-04 DIAGNOSIS — M9903 Segmental and somatic dysfunction of lumbar region: Secondary | ICD-10-CM | POA: Diagnosis not present

## 2019-11-04 DIAGNOSIS — M5136 Other intervertebral disc degeneration, lumbar region: Secondary | ICD-10-CM | POA: Diagnosis not present

## 2019-11-04 DIAGNOSIS — M9905 Segmental and somatic dysfunction of pelvic region: Secondary | ICD-10-CM | POA: Diagnosis not present

## 2019-11-04 DIAGNOSIS — M62830 Muscle spasm of back: Secondary | ICD-10-CM | POA: Diagnosis not present

## 2019-12-15 DIAGNOSIS — M9905 Segmental and somatic dysfunction of pelvic region: Secondary | ICD-10-CM | POA: Diagnosis not present

## 2019-12-15 DIAGNOSIS — M9903 Segmental and somatic dysfunction of lumbar region: Secondary | ICD-10-CM | POA: Diagnosis not present

## 2019-12-15 DIAGNOSIS — M62830 Muscle spasm of back: Secondary | ICD-10-CM | POA: Diagnosis not present

## 2019-12-15 DIAGNOSIS — M5136 Other intervertebral disc degeneration, lumbar region: Secondary | ICD-10-CM | POA: Diagnosis not present

## 2020-01-12 DIAGNOSIS — M5136 Other intervertebral disc degeneration, lumbar region: Secondary | ICD-10-CM | POA: Diagnosis not present

## 2020-01-12 DIAGNOSIS — M9903 Segmental and somatic dysfunction of lumbar region: Secondary | ICD-10-CM | POA: Diagnosis not present

## 2020-01-12 DIAGNOSIS — M9905 Segmental and somatic dysfunction of pelvic region: Secondary | ICD-10-CM | POA: Diagnosis not present

## 2020-01-12 DIAGNOSIS — M62830 Muscle spasm of back: Secondary | ICD-10-CM | POA: Diagnosis not present

## 2020-02-16 DIAGNOSIS — M9905 Segmental and somatic dysfunction of pelvic region: Secondary | ICD-10-CM | POA: Diagnosis not present

## 2020-02-16 DIAGNOSIS — M9903 Segmental and somatic dysfunction of lumbar region: Secondary | ICD-10-CM | POA: Diagnosis not present

## 2020-02-16 DIAGNOSIS — M62830 Muscle spasm of back: Secondary | ICD-10-CM | POA: Diagnosis not present

## 2020-02-16 DIAGNOSIS — M5136 Other intervertebral disc degeneration, lumbar region: Secondary | ICD-10-CM | POA: Diagnosis not present

## 2020-03-15 DIAGNOSIS — M9903 Segmental and somatic dysfunction of lumbar region: Secondary | ICD-10-CM | POA: Diagnosis not present

## 2020-03-15 DIAGNOSIS — M9905 Segmental and somatic dysfunction of pelvic region: Secondary | ICD-10-CM | POA: Diagnosis not present

## 2020-03-15 DIAGNOSIS — M5136 Other intervertebral disc degeneration, lumbar region: Secondary | ICD-10-CM | POA: Diagnosis not present

## 2020-03-15 DIAGNOSIS — M62830 Muscle spasm of back: Secondary | ICD-10-CM | POA: Diagnosis not present

## 2020-04-19 DIAGNOSIS — M5136 Other intervertebral disc degeneration, lumbar region: Secondary | ICD-10-CM | POA: Diagnosis not present

## 2020-04-19 DIAGNOSIS — M62830 Muscle spasm of back: Secondary | ICD-10-CM | POA: Diagnosis not present

## 2020-04-19 DIAGNOSIS — M9903 Segmental and somatic dysfunction of lumbar region: Secondary | ICD-10-CM | POA: Diagnosis not present

## 2020-04-19 DIAGNOSIS — M9905 Segmental and somatic dysfunction of pelvic region: Secondary | ICD-10-CM | POA: Diagnosis not present

## 2020-05-31 DIAGNOSIS — M9903 Segmental and somatic dysfunction of lumbar region: Secondary | ICD-10-CM | POA: Diagnosis not present

## 2020-05-31 DIAGNOSIS — M62830 Muscle spasm of back: Secondary | ICD-10-CM | POA: Diagnosis not present

## 2020-05-31 DIAGNOSIS — M9905 Segmental and somatic dysfunction of pelvic region: Secondary | ICD-10-CM | POA: Diagnosis not present

## 2020-05-31 DIAGNOSIS — M5136 Other intervertebral disc degeneration, lumbar region: Secondary | ICD-10-CM | POA: Diagnosis not present

## 2020-06-07 DIAGNOSIS — M62830 Muscle spasm of back: Secondary | ICD-10-CM | POA: Diagnosis not present

## 2020-06-07 DIAGNOSIS — M9903 Segmental and somatic dysfunction of lumbar region: Secondary | ICD-10-CM | POA: Diagnosis not present

## 2020-06-07 DIAGNOSIS — M9905 Segmental and somatic dysfunction of pelvic region: Secondary | ICD-10-CM | POA: Diagnosis not present

## 2020-06-07 DIAGNOSIS — M5136 Other intervertebral disc degeneration, lumbar region: Secondary | ICD-10-CM | POA: Diagnosis not present

## 2020-06-28 DIAGNOSIS — M5136 Other intervertebral disc degeneration, lumbar region: Secondary | ICD-10-CM | POA: Diagnosis not present

## 2020-06-28 DIAGNOSIS — M62830 Muscle spasm of back: Secondary | ICD-10-CM | POA: Diagnosis not present

## 2020-06-28 DIAGNOSIS — M9903 Segmental and somatic dysfunction of lumbar region: Secondary | ICD-10-CM | POA: Diagnosis not present

## 2020-06-28 DIAGNOSIS — M9905 Segmental and somatic dysfunction of pelvic region: Secondary | ICD-10-CM | POA: Diagnosis not present

## 2020-07-19 DIAGNOSIS — M62830 Muscle spasm of back: Secondary | ICD-10-CM | POA: Diagnosis not present

## 2020-07-19 DIAGNOSIS — M5136 Other intervertebral disc degeneration, lumbar region: Secondary | ICD-10-CM | POA: Diagnosis not present

## 2020-07-19 DIAGNOSIS — M9905 Segmental and somatic dysfunction of pelvic region: Secondary | ICD-10-CM | POA: Diagnosis not present

## 2020-07-19 DIAGNOSIS — M9903 Segmental and somatic dysfunction of lumbar region: Secondary | ICD-10-CM | POA: Diagnosis not present

## 2020-08-09 DIAGNOSIS — M9903 Segmental and somatic dysfunction of lumbar region: Secondary | ICD-10-CM | POA: Diagnosis not present

## 2020-08-09 DIAGNOSIS — M9905 Segmental and somatic dysfunction of pelvic region: Secondary | ICD-10-CM | POA: Diagnosis not present

## 2020-08-09 DIAGNOSIS — M5136 Other intervertebral disc degeneration, lumbar region: Secondary | ICD-10-CM | POA: Diagnosis not present

## 2020-08-09 DIAGNOSIS — M62830 Muscle spasm of back: Secondary | ICD-10-CM | POA: Diagnosis not present

## 2020-08-31 DIAGNOSIS — M9905 Segmental and somatic dysfunction of pelvic region: Secondary | ICD-10-CM | POA: Diagnosis not present

## 2020-08-31 DIAGNOSIS — M62830 Muscle spasm of back: Secondary | ICD-10-CM | POA: Diagnosis not present

## 2020-08-31 DIAGNOSIS — M9903 Segmental and somatic dysfunction of lumbar region: Secondary | ICD-10-CM | POA: Diagnosis not present

## 2020-08-31 DIAGNOSIS — M5136 Other intervertebral disc degeneration, lumbar region: Secondary | ICD-10-CM | POA: Diagnosis not present

## 2020-09-14 ENCOUNTER — Telehealth: Payer: Self-pay | Admitting: Family Medicine

## 2020-09-14 ENCOUNTER — Telehealth: Payer: Self-pay

## 2020-09-14 ENCOUNTER — Other Ambulatory Visit: Payer: Self-pay

## 2020-09-14 DIAGNOSIS — D509 Iron deficiency anemia, unspecified: Secondary | ICD-10-CM

## 2020-09-14 NOTE — Telephone Encounter (Signed)
-----   Message from Roetta Sessions, [place] sent at 06/10/2020  8:07 AM EDT ----- Regarding: due for CBC in Dec Pt due for CBC in Dec 2021  ----- Message ----- From: Yetta Flock, MD Sent: 06/09/2020   5:58 PM EDT To: Roetta Sessions, CMA Subject: RE: cbc due in 06-2020                          Yes that's fine. Thanks ----- Message ----- From: Roetta Sessions, CMA Sent: 06/09/2020   2:33 PM EDT To: Yetta Flock, MD Subject: FW: cbc due in 06-2020                          Pt had CBC in 09-2019. Ok to wait until 09-2020 to repeat? Thx, Jan  ----- Message ----- From: Roetta Sessions, CMA Sent: 06/09/2020 To: Roetta Sessions, CMA Subject: FW: cbc due in 06-2020                          Patient due for cbc in September 2021  ----- Message ----- From: Yetta Flock, MD Sent: 08/28/2019   3:45 PM EST To: Roetta Sessions, CMA Subject: RE: cbc due in 08-2019                         Thanks Jan, yeah looks good, let's repeat in one year. Thanks ----- Message ----- From: Roetta Sessions, CMA Sent: 08/28/2019   7:59 AM EST To: Yetta Flock, MD Subject: FW: cbc due in 08-2019                         Pt was supposed to be due for CBC in November. She had labs in September.  Please advise.  Thanks,  Jan   ----- Message ----- From: Roetta Sessions, CMA Sent: 08/28/2019 To: Roetta Sessions, CMA Subject: cbc due in 08-2019                             Cbc due in November 2020 for IDA. Last done a year ago in 08-2018. check labs.

## 2020-09-14 NOTE — Progress Notes (Signed)
Pt due for cbc.

## 2020-09-14 NOTE — Telephone Encounter (Signed)
Called and LM for pt to go to the lab for CBC. Sent MyChart message

## 2020-09-15 NOTE — Telephone Encounter (Signed)
Spoke to patient schedule CPE and lab

## 2020-09-15 NOTE — Telephone Encounter (Signed)
Please call and schedule CPE with fasting labs prior with Dr. Bedsole. 

## 2020-09-16 ENCOUNTER — Other Ambulatory Visit (INDEPENDENT_AMBULATORY_CARE_PROVIDER_SITE_OTHER): Payer: 59

## 2020-09-16 DIAGNOSIS — D509 Iron deficiency anemia, unspecified: Secondary | ICD-10-CM | POA: Diagnosis not present

## 2020-09-16 LAB — CBC WITH DIFFERENTIAL/PLATELET
Basophils Absolute: 0 10*3/uL (ref 0.0–0.1)
Basophils Relative: 0.1 % (ref 0.0–3.0)
Eosinophils Absolute: 0.2 10*3/uL (ref 0.0–0.7)
Eosinophils Relative: 2.4 % (ref 0.0–5.0)
HCT: 42.1 % (ref 36.0–46.0)
Hemoglobin: 14 g/dL (ref 12.0–15.0)
Lymphocytes Relative: 21.5 % (ref 12.0–46.0)
Lymphs Abs: 1.9 10*3/uL (ref 0.7–4.0)
MCHC: 33.2 g/dL (ref 30.0–36.0)
MCV: 94.5 fl (ref 78.0–100.0)
Monocytes Absolute: 0.6 10*3/uL (ref 0.1–1.0)
Monocytes Relative: 7.2 % (ref 3.0–12.0)
Neutro Abs: 6 10*3/uL (ref 1.4–7.7)
Neutrophils Relative %: 68.8 % (ref 43.0–77.0)
Platelets: 316 10*3/uL (ref 150.0–400.0)
RBC: 4.45 Mil/uL (ref 3.87–5.11)
RDW: 15.2 % (ref 11.5–15.5)
WBC: 8.8 10*3/uL (ref 4.0–10.5)

## 2020-09-19 ENCOUNTER — Other Ambulatory Visit: Payer: Self-pay | Admitting: Family Medicine

## 2020-09-26 ENCOUNTER — Other Ambulatory Visit: Payer: Self-pay | Admitting: Family Medicine

## 2020-09-27 DIAGNOSIS — M5136 Other intervertebral disc degeneration, lumbar region: Secondary | ICD-10-CM | POA: Diagnosis not present

## 2020-09-27 DIAGNOSIS — M62830 Muscle spasm of back: Secondary | ICD-10-CM | POA: Diagnosis not present

## 2020-09-27 DIAGNOSIS — M9903 Segmental and somatic dysfunction of lumbar region: Secondary | ICD-10-CM | POA: Diagnosis not present

## 2020-09-27 DIAGNOSIS — M9905 Segmental and somatic dysfunction of pelvic region: Secondary | ICD-10-CM | POA: Diagnosis not present

## 2020-11-01 ENCOUNTER — Other Ambulatory Visit: Payer: 59

## 2020-11-01 DIAGNOSIS — M62830 Muscle spasm of back: Secondary | ICD-10-CM | POA: Diagnosis not present

## 2020-11-01 DIAGNOSIS — M9905 Segmental and somatic dysfunction of pelvic region: Secondary | ICD-10-CM | POA: Diagnosis not present

## 2020-11-01 DIAGNOSIS — M5136 Other intervertebral disc degeneration, lumbar region: Secondary | ICD-10-CM | POA: Diagnosis not present

## 2020-11-01 DIAGNOSIS — M9903 Segmental and somatic dysfunction of lumbar region: Secondary | ICD-10-CM | POA: Diagnosis not present

## 2020-11-11 ENCOUNTER — Encounter: Payer: 59 | Admitting: Family Medicine

## 2020-11-22 ENCOUNTER — Telehealth: Payer: Self-pay | Admitting: Family Medicine

## 2020-11-22 DIAGNOSIS — R7303 Prediabetes: Secondary | ICD-10-CM

## 2020-11-22 DIAGNOSIS — D509 Iron deficiency anemia, unspecified: Secondary | ICD-10-CM

## 2020-11-22 DIAGNOSIS — E7800 Pure hypercholesterolemia, unspecified: Secondary | ICD-10-CM

## 2020-11-22 DIAGNOSIS — E038 Other specified hypothyroidism: Secondary | ICD-10-CM

## 2020-11-22 NOTE — Telephone Encounter (Signed)
-----   Message from Cloyd Stagers, RT sent at 11/15/2020  2:00 PM EST ----- Regarding: Lab Orders for Thursday 2.24.2022 Please place lab orders for Thursday 2.24.2022, office visit for physical on Thursday 3.3.2022 Thank you, Dyke Maes RT(R)

## 2020-11-29 DIAGNOSIS — M9905 Segmental and somatic dysfunction of pelvic region: Secondary | ICD-10-CM | POA: Diagnosis not present

## 2020-11-29 DIAGNOSIS — M9903 Segmental and somatic dysfunction of lumbar region: Secondary | ICD-10-CM | POA: Diagnosis not present

## 2020-11-29 DIAGNOSIS — M62830 Muscle spasm of back: Secondary | ICD-10-CM | POA: Diagnosis not present

## 2020-11-29 DIAGNOSIS — M5136 Other intervertebral disc degeneration, lumbar region: Secondary | ICD-10-CM | POA: Diagnosis not present

## 2020-11-30 ENCOUNTER — Other Ambulatory Visit: Payer: Self-pay

## 2020-11-30 ENCOUNTER — Ambulatory Visit: Payer: 59 | Admitting: Dermatology

## 2020-11-30 DIAGNOSIS — D485 Neoplasm of uncertain behavior of skin: Secondary | ICD-10-CM

## 2020-11-30 DIAGNOSIS — D2239 Melanocytic nevi of other parts of face: Secondary | ICD-10-CM

## 2020-11-30 DIAGNOSIS — C4491 Basal cell carcinoma of skin, unspecified: Secondary | ICD-10-CM

## 2020-11-30 DIAGNOSIS — L219 Seborrheic dermatitis, unspecified: Secondary | ICD-10-CM

## 2020-11-30 DIAGNOSIS — C44311 Basal cell carcinoma of skin of nose: Secondary | ICD-10-CM

## 2020-11-30 DIAGNOSIS — L300 Nummular dermatitis: Secondary | ICD-10-CM | POA: Diagnosis not present

## 2020-11-30 DIAGNOSIS — L821 Other seborrheic keratosis: Secondary | ICD-10-CM | POA: Diagnosis not present

## 2020-11-30 DIAGNOSIS — D229 Melanocytic nevi, unspecified: Secondary | ICD-10-CM

## 2020-11-30 HISTORY — DX: Basal cell carcinoma of skin, unspecified: C44.91

## 2020-11-30 MED ORDER — PIMECROLIMUS 1 % EX CREA: TOPICAL_CREAM | Freq: Two times a day (BID) | CUTANEOUS | 2 refills | Status: AC

## 2020-11-30 MED ORDER — KETOCONAZOLE 2 % EX SHAM: MEDICATED_SHAMPOO | CUTANEOUS | 2 refills | Status: DC

## 2020-11-30 MED ORDER — CLOBETASOL PROPIONATE 0.05 % EX SOLN: CUTANEOUS | 1 refills | Status: DC

## 2020-11-30 NOTE — Progress Notes (Signed)
New Patient Visit  Subjective  Virginia Wilson is a 65 y.o. female who presents for the following: Skin Problem.  New patient presents today to have several spots checked. She has 2 spots on her nose for years, 1 spot has bled in the past. She also has irritated spots on her legs and left shoulder, irritated by her bra strap. She is itchy on her neck and back, off and on. She has a history of psoriasis of the scalp and ears. She is controlled with fluocinonide solution on her scalp. She uses Scalpicin in the ears prn.  She has uses OTC HC cream on her eyelids qd for years.   The following portions of the chart were reviewed this encounter and updated as appropriate:       Review of Systems:  No other skin or systemic complaints except as noted in HPI or Assessment and Plan.  Objective  Well appearing patient in no apparent distress; mood and affect are within normal limits.  A focused examination was performed including face, legs, chest, neck, back. Relevant physical exam findings are noted in the Assessment and Plan.  Objective  Left Nasal Tip: 6.0 x 4.35mm pink pearly papule     Objective  R nasal tip: 4.38mm pink flesh papule, no changes per pt, no bleeding or growth  Images      Objective  back, neck, bil lower legs: Pink scaly patches on back, neck, bil lower legs  Objective  Eyebrows, post auricular crease, scalp: Erythema with fissuring and scale of the postauricular crease; mild diffuse scaling of the scalp; erythema of the eyelids.   Assessment & Plan   Seborrheic Keratoses - Stuck-on, waxy, tan-brown papules and plaques, including left anterior shoulder  - Discussed benign etiology and prognosis. - Observe - Call for any changes Reassured benign age-related growth.  Recommend observation.  Discussed cryotherapy if spot(s) become irritated or inflamed.   Neoplasm of uncertain behavior of skin Left Nasal Tip  Skin / nail biopsy Type of biopsy:  tangential   Informed consent: discussed and consent obtained   Patient was prepped and draped in usual sterile fashion: Area prepped with alcohol. Anesthesia: the lesion was anesthetized in a standard fashion   Anesthetic:  1% lidocaine w/ epinephrine 1-100,000 buffered w/ 8.4% NaHCO3 Instrument used: flexible razor blade   Hemostasis achieved with: pressure, aluminum chloride and electrodesiccation   Outcome: patient tolerated procedure well   Post-procedure details: wound care instructions given   Post-procedure details comment:  Ointment and small bandage applied  Specimen 1 - Surgical pathology Differential Diagnosis: Rosacea r/o BCC Check Margins: No 6.0 x 4.47mm pink pearly papule  If positive, will send for [facility] in [place].  Nevus R nasal tip  Benign-appearing.  Observation.  Call clinic for new or changing moles.  Recommend daily use of broad spectrum spf 30+ sunscreen to sun-exposed areas.   No changes per patient. Present 4-5 years. No bleeding. Discussed possible biopsy pending results of left nasal tip.  Nummular dermatitis back, neck, bil lower legs  Chronic condition with flare Start clobetasol solution mixed in jar of CeraVe Cream and apply all over neck, back, legs twice daily until itchy rash improved dsp 25ml 1Rf.  Recommend mild soap and moisturizing cream 1-2 times daily.    Topical steroids (such as triamcinolone, fluocinolone, fluocinonide, mometasone, clobetasol, halobetasol, betamethasone, hydrocortisone) can cause thinning and lightening of the skin if they are used for too long in the same area. Your physician has  selected the right strength medicine for your problem and area affected on the body. Please use your medication only as directed by your physician to prevent side effects.    clobetasol (TEMOVATE) 0.05 % external solution - back, neck, bil lower legs  Seborrheic dermatitis Eyebrows, post auricular crease, scalp  Start Elidel Cream  Apply qd/bid to face, eyelids, post auricular prn rash dsp 30g 2Rf. Start 2% ketoconazole shampoo to scalp  Avoid hydrocortisone to the eyelids. Caution atrophy with long-term use.   Seborrheic Dermatitis  -  is a chronic persistent rash characterized by pinkness and scaling most commonly of the mid face but also can occur on the scalp (dandruff), ears; mid chest and mid back. It tends to be exacerbated by stress and cooler weather.  People who have neurologic disease may experience new onset or exacerbation of existing seborrheic dermatitis.  The condition is not curable but treatable and can be controlled.   pimecrolimus (ELIDEL) 1 % cream - Eyebrows, post auricular crease, scalp  ketoconazole (NIZORAL) 2 % shampoo - Eyebrows, post auricular crease, scalp  Return pending biopsy results.Lindi Adie, CMA, am acting as scribe for Brendolyn Patty, MD .  Documentation: I have reviewed the above documentation for accuracy and completeness, and I agree with the above.  Brendolyn Patty MD

## 2020-11-30 NOTE — Patient Instructions (Addendum)
Eczema Skin Care  Buy TWO 16oz jars of CeraVe moisturizing cream  [facility], [facility], [facility] (no prescription needed)  Costs about $15 per jar   Jar #1: Use as a moisturizer as needed. Can be applied to any area of the body. Use twice daily to unaffected areas.  Jar #2: Pour one 29ml bottle of clobetasol 0.05% solution into jar, mix well. Label this jar to indicate the medication has been added. Use twice daily to affected areas. Do not apply to face, groin or underarms.  Moisturizer may burn or sting initially. Try for at least 4 weeks.    Recommend mild soap and moisturizing cream 1-2 times daily.    Wound Care Instructions  1. Cleanse wound gently with soap and water once a day then pat dry with clean gauze. Apply a thing coat of Petrolatum (petroleum jelly, "Vaseline") over the wound (unless you have an allergy to this). We recommend that you use a new, sterile tube of Vaseline. Do not pick or remove scabs. Do not remove the yellow or white "healing tissue" from the base of the wound.  2. Cover the wound with fresh, clean, nonstick gauze and secure with paper tape. You may use Band-Aids in place of gauze and tape if the would is small enough, but would recommend trimming much of the tape off as there is often too much. Sometimes Band-Aids can irritate the skin.  3. You should call the office for your biopsy report after 1 week if you have not already been contacted.  4. If you experience any problems, such as abnormal amounts of bleeding, swelling, significant bruising, significant pain, or evidence of infection, please call the office immediately.  5. FOR ADULT SURGERY PATIENTS: If you need something for pain relief you may take 1 extra strength Tylenol (acetaminophen) AND 2 Ibuprofen (200mg  each) together every 4 hours as needed for pain. (do not take these if you are allergic to them or if you have a reason you should not take them.) Typically, you may only need pain medication for 1 to  3 days.

## 2020-12-02 ENCOUNTER — Other Ambulatory Visit (INDEPENDENT_AMBULATORY_CARE_PROVIDER_SITE_OTHER): Payer: 59

## 2020-12-02 ENCOUNTER — Other Ambulatory Visit: Payer: Self-pay

## 2020-12-02 DIAGNOSIS — E038 Other specified hypothyroidism: Secondary | ICD-10-CM

## 2020-12-02 DIAGNOSIS — D509 Iron deficiency anemia, unspecified: Secondary | ICD-10-CM | POA: Diagnosis not present

## 2020-12-02 DIAGNOSIS — E7800 Pure hypercholesterolemia, unspecified: Secondary | ICD-10-CM

## 2020-12-02 DIAGNOSIS — R7303 Prediabetes: Secondary | ICD-10-CM | POA: Diagnosis not present

## 2020-12-02 LAB — LIPID PANEL
Cholesterol: 148 mg/dL (ref 0–200)
HDL: 46.6 mg/dL (ref >39.00)
NonHDL: 101.53
Total CHOL/HDL Ratio: 3
Triglycerides: 225 mg/dL — ABNORMAL HIGH (ref 0.0–149.0)
VLDL: 45 mg/dL — ABNORMAL HIGH (ref 0.0–40.0)

## 2020-12-02 LAB — CBC WITH DIFFERENTIAL/PLATELET
Basophils Absolute: 0.1 10*3/uL (ref 0.0–0.1)
Basophils Relative: 0.9 % (ref 0.0–3.0)
Eosinophils Absolute: 0.2 10*3/uL (ref 0.0–0.7)
Eosinophils Relative: 2.4 % (ref 0.0–5.0)
HCT: 44.2 % (ref 36.0–46.0)
Hemoglobin: 14.4 g/dL (ref 12.0–15.0)
Lymphocytes Relative: 22.5 % (ref 12.0–46.0)
Lymphs Abs: 1.6 10*3/uL (ref 0.7–4.0)
MCHC: 32.5 g/dL (ref 30.0–36.0)
MCV: 94.2 fl (ref 78.0–100.0)
Monocytes Absolute: 0.5 10*3/uL (ref 0.1–1.0)
Monocytes Relative: 7.1 % (ref 3.0–12.0)
Neutro Abs: 4.8 10*3/uL (ref 1.4–7.7)
Neutrophils Relative %: 67.1 % (ref 43.0–77.0)
Platelets: 291 10*3/uL (ref 150.0–400.0)
RBC: 4.69 Mil/uL (ref 3.87–5.11)
RDW: 15.2 % (ref 11.5–15.5)
WBC: 7.1 10*3/uL (ref 4.0–10.5)

## 2020-12-02 LAB — T4, FREE: Free T4: 0.74 ng/dL (ref 0.60–1.60)

## 2020-12-02 LAB — COMPREHENSIVE METABOLIC PANEL
ALT: 22 U/L (ref 0–35)
AST: 22 U/L (ref 0–37)
Albumin: 4.5 g/dL (ref 3.5–5.2)
Alkaline Phosphatase: 73 U/L (ref 39–117)
BUN: 24 mg/dL — ABNORMAL HIGH (ref 6–23)
CO2: 32 mEq/L (ref 19–32)
Calcium: 9.9 mg/dL (ref 8.4–10.5)
Chloride: 102 mEq/L (ref 96–112)
Creatinine, Ser: 1.26 mg/dL — ABNORMAL HIGH (ref 0.40–1.20)
GFR: 45.16 mL/min — ABNORMAL LOW (ref >60.00)
Glucose, Bld: 100 mg/dL — ABNORMAL HIGH (ref 70–99)
Potassium: 4.2 mEq/L (ref 3.5–5.1)
Sodium: 141 mEq/L (ref 135–145)
Total Bilirubin: 0.6 mg/dL (ref 0.2–1.2)
Total Protein: 7 g/dL (ref 6.0–8.3)

## 2020-12-02 LAB — LDL CHOLESTEROL, DIRECT: Direct LDL: 31 mg/dL

## 2020-12-02 LAB — TSH: TSH: 5.33 u[IU]/mL — ABNORMAL HIGH (ref 0.35–4.50)

## 2020-12-02 LAB — HEMOGLOBIN A1C: Hgb A1c MFr Bld: 6 % (ref 4.6–6.5)

## 2020-12-02 LAB — T3, FREE: T3, Free: 2.7 pg/mL (ref 2.3–4.2)

## 2020-12-02 NOTE — Progress Notes (Signed)
No critical labs need to be addressed urgently. We will discuss labs in detail at upcoming office visit.   

## 2020-12-06 ENCOUNTER — Telehealth: Payer: Self-pay

## 2020-12-06 ENCOUNTER — Other Ambulatory Visit: Payer: Self-pay | Admitting: Family Medicine

## 2020-12-06 DIAGNOSIS — C44311 Basal cell carcinoma of skin of nose: Secondary | ICD-10-CM

## 2020-12-06 NOTE — Telephone Encounter (Signed)
-----   Message from Brendolyn Patty, MD sent at 12/06/2020 12:10 PM EST ----- Skin , left nasal tip BASAL CELL CARCINOMA, NODULAR PATTERN  BCC skin cancer- needs Mohs surgery [facility]

## 2020-12-06 NOTE — Telephone Encounter (Signed)
Pt advised of bx results.  Pt advised referral to [facility] in [place] for [facility] would be sent today.  Pt advised [facility] will call her to schedule appointment./sh

## 2020-12-07 NOTE — Telephone Encounter (Signed)
CPE scheduled for Thursday 12/09/20.  Will hold refill request until that appointment.

## 2020-12-08 NOTE — Telephone Encounter (Signed)
Please refill if appropriate after CPE on 12/09/2020.

## 2020-12-09 ENCOUNTER — Ambulatory Visit (INDEPENDENT_AMBULATORY_CARE_PROVIDER_SITE_OTHER): Payer: 59 | Admitting: Family Medicine

## 2020-12-09 ENCOUNTER — Encounter: Payer: Self-pay | Admitting: Family Medicine

## 2020-12-09 ENCOUNTER — Other Ambulatory Visit: Payer: Self-pay

## 2020-12-09 VITALS — BP 100/74 | HR 71 | Temp 97.8°F | Ht 66.0 in | Wt 188.5 lb

## 2020-12-09 DIAGNOSIS — I10 Essential (primary) hypertension: Secondary | ICD-10-CM | POA: Diagnosis not present

## 2020-12-09 DIAGNOSIS — E7800 Pure hypercholesterolemia, unspecified: Secondary | ICD-10-CM | POA: Diagnosis not present

## 2020-12-09 DIAGNOSIS — D509 Iron deficiency anemia, unspecified: Secondary | ICD-10-CM | POA: Diagnosis not present

## 2020-12-09 DIAGNOSIS — E038 Other specified hypothyroidism: Secondary | ICD-10-CM

## 2020-12-09 DIAGNOSIS — C44311 Basal cell carcinoma of skin of nose: Secondary | ICD-10-CM | POA: Insufficient documentation

## 2020-12-09 DIAGNOSIS — Z0000 Encounter for general adult medical examination without abnormal findings: Secondary | ICD-10-CM | POA: Diagnosis not present

## 2020-12-09 DIAGNOSIS — R7303 Prediabetes: Secondary | ICD-10-CM | POA: Diagnosis not present

## 2020-12-09 DIAGNOSIS — M109 Gout, unspecified: Secondary | ICD-10-CM

## 2020-12-09 DIAGNOSIS — N181 Chronic kidney disease, stage 1: Secondary | ICD-10-CM

## 2020-12-09 MED ORDER — BETAMETHASONE DIPROPIONATE 0.05 % EX CREA: TOPICAL_CREAM | Freq: Two times a day (BID) | CUTANEOUS | 2 refills | Status: AC

## 2020-12-09 NOTE — Assessment & Plan Note (Signed)
Resolved

## 2020-12-09 NOTE — Patient Instructions (Addendum)
Call to set up mammogram.  Keep working on healthy eating and regular exercise   Low-Purine Eating Plan A low-purine eating plan involves making food choices to limit your intake of purine. Purine is a kind of uric acid. Too much uric acid in your blood can cause certain conditions, such as gout and kidney stones. Eating a low-purine diet can help control these conditions. What are tips for following this plan? Reading food labels  Avoid foods with saturated or Trans fat.  Check the ingredient list of grains-based foods, such as bread and cereal, to make sure that they contain whole grains.  Check the ingredient list of sauces or soups to make sure they do not contain meat or fish.  When choosing soft drinks, check the ingredient list to make sure they do not contain high-fructose corn syrup. Shopping  Buy plenty of fresh fruits and vegetables.  Avoid buying canned or fresh fish.  Buy dairy products labeled as low-fat or nonfat.  Avoid buying premade or processed foods. These foods are often high in fat, salt (sodium), and added sugar.   Cooking  Use olive oil instead of butter when cooking. Oils like olive oil, canola oil, and sunflower oil contain healthy fats. Meal planning  Learn which foods do or do not affect you. If you find out that a food tends to cause your gout symptoms to flare up, avoid eating that food. You can enjoy foods that do not cause problems. If you have any questions about a food item, talk with your dietitian or health care provider.  Limit foods high in fat, especially saturated fat. Fat makes it harder for your body to get rid of uric acid.  Choose foods that are lower in fat and are lean sources of protein. General guidelines  Limit alcohol intake to no more than 1 drink a day for nonpregnant women and 2 drinks a day for men. One drink equals 12 oz of beer, 5 oz of wine, or 1 oz of hard liquor. Alcohol can affect the way your body gets rid of uric  acid.  Drink plenty of water to keep your urine clear or pale yellow. Fluids can help remove uric acid from your body.  If directed by your health care provider, take a vitamin C supplement.  Work with your health care provider and dietitian to develop a plan to achieve or maintain a healthy weight. Losing weight can help reduce uric acid in your blood. What foods are recommended? The items listed may not be a complete list. Talk with your dietitian about what dietary choices are best for you. Foods low in purines Foods low in purines do not need to be limited. These include:  All fruits.  All low-purine vegetables, pickles, and olives.  Breads, pasta, rice, cornbread, and popcorn. Cake and other baked goods.  All dairy foods.  Eggs, nuts, and nut butters.  Spices and condiments, such as salt, herbs, and vinegar.  Plant oils, butter, and margarine.  Water, sugar-free soft drinks, tea, coffee, and cocoa.  Vegetable-based soups, broths, sauces, and gravies. Foods moderate in purines Foods moderate in purines should be limited to the amounts listed.   cup of asparagus, cauliflower, spinach, mushrooms, or green peas, each day.  2/3 cup uncooked oatmeal, each day.   cup dry wheat bran or wheat germ, each day.  2-3 ounces of meat or poultry, each day.  4-6 ounces of shellfish, such as crab, lobster, oysters, or shrimp, each day.  1  cup cooked beans, peas, or lentils, each day.  Soup, broths, or bouillon made from meat or fish. Limit these foods as much as possible. What foods are not recommended? The items listed may not be a complete list. Talk with your dietitian about what dietary choices are best for you. Limit your intake of foods high in purines, including:  Beer and other alcohol.  Meat-based gravy or sauce.  Canned or fresh fish, such as: ? Anchovies, sardines, herring, and tuna. ? Mussels and scallops. ? Codfish, trout, and haddock.  Berniece Salines.  Organ  meats, such as: ? Liver or kidney. ? Tripe. ? Sweetbreads (thymus gland or pancreas).  Wild Clinical biochemist.  Yeast or yeast extract supplements.  Drinks sweetened with high-fructose corn syrup. Summary  Eating a low-purine diet can help control conditions caused by too much uric acid in the body, such as gout or kidney stones.  Choose low-purine foods, limit alcohol, and limit foods high in fat.  You will learn over time which foods do or do not affect you. If you find out that a food tends to cause your gout symptoms to flare up, avoid eating that food. This information is not intended to replace advice given to you by your health care provider. Make sure you discuss any questions you have with your health care provider. Document Revised: 01/08/2020 Document Reviewed: 01/08/2020 Elsevier Patient Education  2021 [employer].

## 2020-12-09 NOTE — Progress Notes (Signed)
Patient ID: LEILI ESKENAZI, female    DOB: 08-11-1956, 65 y.o.   MRN: [id number]  This visit was conducted in person.  BP 100/74   Pulse 71   Temp 97.8 F (36.6 C) (Temporal)   Ht 5\' 6"  (1.676 m)   Wt 188 lb 8 oz (85.5 kg)   SpO2 96%   BMI 30.42 kg/m    CC:  Chief Complaint  Patient presents with  . Annual Exam    Subjective:   HPI: COHEN DOLEMAN is a 65 y.o. female presenting on 12/09/2020 for Annual Exam  Hypertension:   Good control on HCTZ.. much lower with weight loss. BP Readings from Last 3 Encounters:  12/09/20 100/74  09/19/19 130/86  06/30/19 (!) 138/94  Using medication without problems or lightheadedness:  none Chest pain with exertion:none Edema:none Short of breath:none Average home BPs: Other issues: Wt Readings from Last 3 Encounters:  12/09/20 188 lb 8 oz (85.5 kg)  09/19/19 200 lb 8 oz (90.9 kg)  06/30/19 200 lb 8 oz (90.9 kg)     Elevated Cholesterol:  At goal on atorvastatin 40 mg daily Lab Results  Component Value Date   CHOL 148 12/02/2020   HDL 46.60 12/02/2020   LDLCALC 61 09/10/2018   LDLDIRECT 31.0 12/02/2020   TRIG 225.0 (H) 12/02/2020   CHOLHDL 3 12/02/2020  Using medications without problems: Muscle aches:  Diet compliance: cutting back on snacking , portion size Exercise: increased exercise Other complaints:   Iron def anemia: resolved    Hypothyroid On levothyroxine 25 mcg daily. TSH high but free t3 and t4 nml.  Prediabetes  Lab Results  Component Value Date   HGBA1C 6.0 12/02/2020    Gout stable on allopurinol,  occ achyness in ankle.    CKD: slight fluctuation. Avoiding NSAIDs, drinks lots of water.  Saw Nephrology in 2018.. no further workup needed, no family history of kidney issues.  Relevant past medical, surgical, family and social history reviewed and updated as indicated. Interim medical history since our last visit reviewed. Allergies and medications reviewed and updated. Outpatient Medications Prior  to Visit  Medication Sig Dispense Refill  . allopurinol (ZYLOPRIM) 300 MG tablet TAKE 1 TABLET BY MOUTH  DAILY 90 tablet 3  . atorvastatin (LIPITOR) 40 MG tablet TAKE 1 TABLET BY MOUTH  DAILY 90 tablet 3  . clobetasol (TEMOVATE) 0.05 % external solution Patient to mix solution in jar of CeraVe Cream. Apply all over neck and back twice daily until itchy rash improved. Avoid face, groin, underarms. 50 mL 1  . EPINEPHrine 0.3 mg/0.3 mL IJ SOAJ injection     . ferrous sulfate 325 (65 FE) MG tablet Take 325 mg by mouth daily.     . fluocinonide (LIDEX) 0.05 % external solution Apply 1 application topically daily as needed. 60 mL 3  . hydrochlorothiazide (HYDRODIURIL) 25 MG tablet TAKE 1 TABLET BY MOUTH  DAILY 90 tablet 3  . ketoconazole (NIZORAL) 2 % shampoo Apply to scalp and behind ears, left sit several minutes before rinsing. 120 mL 2  . levothyroxine (SYNTHROID) 25 MCG tablet TAKE 1 TABLET BY MOUTH  DAILY BEFORE BREAKFAST 90 tablet 0  . nitroGLYCERIN (NITROSTAT) 0.4 MG SL tablet Place 1 tablet (0.4 mg total) under the tongue every 5 (five) minutes as needed for chest pain. 25 tablet 1  . Omega-3 Fatty Acids (FISH OIL) 1200 MG CAPS Take 1 capsule by mouth daily.    Marland Kitchen omeprazole (PRILOSEC) 40 MG  capsule Take 40 mg by mouth daily.    . pimecrolimus (ELIDEL) 1 % cream Apply topically 2 (two) times daily. Apply 1-2 times a day to eyebrows and around ears as needed for rash. 30 g 2  . betamethasone dipropionate (DIPROLENE) 0.05 % cream Apply topically 2 (two) times daily. 15 g 0  . atorvastatin (LIPITOR) 40 MG tablet TAKE 1 TABLET BY MOUTH  DAILY 90 tablet 0  . hydrochlorothiazide (HYDRODIURIL) 25 MG tablet TAKE 1 TABLET BY MOUTH  DAILY 90 tablet 0   No facility-administered medications prior to visit.     Per HPI unless specifically indicated in ROS section below Review of Systems  Constitutional: Negative for fatigue and fever.  HENT: Negative for congestion.   Eyes: Negative for pain.   Respiratory: Negative for cough and shortness of breath.   Cardiovascular: Negative for chest pain, palpitations and leg swelling.  Gastrointestinal: Negative for abdominal pain.  Genitourinary: Negative for dysuria and vaginal bleeding.  Musculoskeletal: Negative for back pain.  Neurological: Negative for syncope, light-headedness and headaches.  Psychiatric/Behavioral: Negative for dysphoric mood.   Objective:  BP 100/74   Pulse 71   Temp 97.8 F (36.6 C) (Temporal)   Ht 5\' 6"  (1.676 m)   Wt 188 lb 8 oz (85.5 kg)   SpO2 96%   BMI 30.42 kg/m   Wt Readings from Last 3 Encounters:  12/09/20 188 lb 8 oz (85.5 kg)  09/19/19 200 lb 8 oz (90.9 kg)  06/30/19 200 lb 8 oz (90.9 kg)      Physical Exam Constitutional:      General: She is not in acute distress.Vital signs are normal.     Appearance: Normal appearance. She is well-developed and well-nourished. She is not ill-appearing or toxic-appearing.  HENT:     Head: Normocephalic.     Right Ear: Hearing, tympanic membrane, ear canal and external ear normal.     Left Ear: Hearing, tympanic membrane, ear canal and external ear normal.     Nose: Nose normal.  Eyes:     General: Lids are normal. Lids are everted, no foreign bodies appreciated.     Extraocular Movements: EOM normal.     Conjunctiva/sclera: Conjunctivae normal.     Pupils: Pupils are equal, round, and reactive to light.  Neck:     Thyroid: No thyroid mass or thyromegaly.     Vascular: No carotid bruit.     Trachea: Trachea normal.  Cardiovascular:     Rate and Rhythm: Normal rate and regular rhythm.     Pulses: Intact distal pulses.     Heart sounds: Normal heart sounds, S1 normal and S2 normal. No murmur heard. No gallop.   Pulmonary:     Effort: Pulmonary effort is normal. No respiratory distress.     Breath sounds: Normal breath sounds. No wheezing, rhonchi or rales.  Abdominal:     General: Bowel sounds are normal. There is no distension or abdominal  bruit.     Palpations: Abdomen is soft. There is no fluid wave, hepatosplenomegaly or mass.     Tenderness: There is no abdominal tenderness. There is no CVA tenderness, guarding or rebound.     Hernia: No hernia is present.  Musculoskeletal:     Cervical back: Normal range of motion and neck supple.  Lymphadenopathy:     Cervical: No cervical adenopathy.     Upper Body:  No axillary adenopathy present. Skin:    General: Skin is warm, dry and intact.  Findings: No rash.  Neurological:     Mental Status: She is alert.     Cranial Nerves: No cranial nerve deficit.     Sensory: No sensory deficit.     Deep Tendon Reflexes: Strength normal.  Psychiatric:        Mood and Affect: Mood is not anxious or depressed.        Speech: Speech normal.        Behavior: Behavior normal. Behavior is cooperative.        Cognition and Memory: Cognition and memory normal.        Judgment: Judgment normal.       Results for orders placed or performed in visit on 12/02/20  T3, free  Result Value Ref Range   T3, Free 2.7 2.3 - 4.2 pg/mL  T4, free  Result Value Ref Range   Free T4 0.74 0.60 - 1.60 ng/dL  TSH  Result Value Ref Range   TSH 5.33 (H) 0.35 - 4.50 uIU/mL  CBC with Differential/Platelet  Result Value Ref Range   WBC 7.1 4.0 - 10.5 K/uL   RBC 4.69 3.87 - 5.11 Mil/uL   Hemoglobin 14.4 12.0 - 15.0 g/dL   HCT 44.2 36.0 - 46.0 %   MCV 94.2 78.0 - 100.0 fl   MCHC 32.5 30.0 - 36.0 g/dL   RDW 15.2 11.5 - 15.5 %   Platelets 291.0 150.0 - 400.0 K/uL   Neutrophils Relative % 67.1 43.0 - 77.0 %   Lymphocytes Relative 22.5 12.0 - 46.0 %   Monocytes Relative 7.1 3.0 - 12.0 %   Eosinophils Relative 2.4 0.0 - 5.0 %   Basophils Relative 0.9 0.0 - 3.0 %   Neutro Abs 4.8 1.4 - 7.7 K/uL   Lymphs Abs 1.6 0.7 - 4.0 K/uL   Monocytes Absolute 0.5 0.1 - 1.0 K/uL   Eosinophils Absolute 0.2 0.0 - 0.7 K/uL   Basophils Absolute 0.1 0.0 - 0.1 K/uL  Comprehensive metabolic panel  Result Value Ref  Range   Sodium 141 135 - 145 mEq/L   Potassium 4.2 3.5 - 5.1 mEq/L   Chloride 102 96 - 112 mEq/L   CO2 32 19 - 32 mEq/L   Glucose, Bld 100 (H) 70 - 99 mg/dL   BUN 24 (H) 6 - 23 mg/dL   Creatinine, Ser 1.26 (H) 0.40 - 1.20 mg/dL   Total Bilirubin 0.6 0.2 - 1.2 mg/dL   Alkaline Phosphatase 73 39 - 117 U/L   AST 22 0 - 37 U/L   ALT 22 0 - 35 U/L   Total Protein 7.0 6.0 - 8.3 g/dL   Albumin 4.5 3.5 - 5.2 g/dL   GFR 45.16 (L) >60.00 mL/min   Calcium 9.9 8.4 - 10.5 mg/dL  Lipid panel  Result Value Ref Range   Cholesterol 148 0 - 200 mg/dL   Triglycerides 225.0 (H) 0.0 - 149.0 mg/dL   HDL 46.60 >39.00 mg/dL   VLDL 45.0 (H) 0.0 - 40.0 mg/dL   Total CHOL/HDL Ratio 3    NonHDL 101.53   Hemoglobin A1c  Result Value Ref Range   Hgb A1c MFr Bld 6.0 4.6 - 6.5 %  LDL cholesterol, direct  Result Value Ref Range   Direct LDL 31.0 mg/dL    This visit occurred during the SARS-CoV-2 public health emergency.  Safety protocols were in place, including screening questions prior to the visit, additional usage of staff PPE, and extensive cleaning of exam room while observing appropriate contact time  as indicated for disinfecting solutions.   COVID 19 screen:  No recent travel or known exposure to COVID19 The patient denies respiratory symptoms of COVID 19 at this time. The importance of social distancing was discussed today.   Assessment and Plan The patient's preventative maintenance and recommended screening tests for an annual wellness exam were reviewed in full today. Brought up to date unless services declined.  Counselled on the importance of diet, exercise, and its role in overall health and mortality. The patient's FH and SH was reviewed, including their home life, tobacco status, and drug and alcohol status.   PAP/DVE: 09/2019 nml, no HPV, Asymptomatic , no family history of ovarian or endometrial cancer Mammo: nml 2018, plan repeat every 2 years. DEXA: start at age 49 given low  risk. Colon:colonoscopy9/2018 polyps, tics: repeat in 5 years Vaccines: Uptodate.Refused flu vaccine., COVID x 2  Booster due Hep C: neg FTD:DUKGURK. Nonsmoker No ETOH use.  Betamethasone  prescribed by dermatology or .Marland Kitchen She needs a new rx sent to use with Good RX coupon.   Problem List Items Addressed This Visit    Chronic renal insufficiency, stage I   Essential hypertension, benign    Stable, chronic.  Continue current medication.    HCTZ 25 mg dialy      Gout with manifestations   HYPERCHOLESTEROLEMIA    Stable, chronic.  Continue current medication.   Atorvastatin 40 mg daily        Hypothyroidism    Stable, chronic.  Continue current medication.   levothyroxine 25 mcg daily.      Iron deficiency anemia    Resolved.      Prediabetes    Encouraged exercise, weight loss, healthy eating habits.        Other Visit Diagnoses    Routine general medical examination at a health care facility    -  Primary       Eliezer Lofts, MD

## 2020-12-09 NOTE — Assessment & Plan Note (Signed)
Stable, chronic.  Continue current medication.   levothyroxine 25 mcg daily.

## 2020-12-09 NOTE — Assessment & Plan Note (Signed)
Encouraged exercise, weight loss, healthy eating habits. ? ?

## 2020-12-09 NOTE — Assessment & Plan Note (Signed)
Stable, chronic.  Continue current medication.   Atorvastatin 40 mg daily. 

## 2020-12-09 NOTE — Assessment & Plan Note (Signed)
Stable, chronic.  Continue current medication.    HCTZ 25 mg dialy

## 2020-12-13 ENCOUNTER — Other Ambulatory Visit: Payer: Self-pay | Admitting: Family Medicine

## 2020-12-16 ENCOUNTER — Other Ambulatory Visit: Payer: Self-pay | Admitting: Family Medicine

## 2020-12-16 DIAGNOSIS — Z1231 Encounter for screening mammogram for malignant neoplasm of breast: Secondary | ICD-10-CM

## 2021-01-06 ENCOUNTER — Other Ambulatory Visit: Payer: Self-pay

## 2021-01-06 ENCOUNTER — Ambulatory Visit
Admission: RE | Admit: 2021-01-06 | Discharge: 2021-01-06 | Disposition: A | Discharge status: Home / Self Care | Payer: 59 | Source: Ambulatory Visit | Attending: Family Medicine | Admitting: Family Medicine

## 2021-01-06 DIAGNOSIS — Z1231 Encounter for screening mammogram for malignant neoplasm of breast: Secondary | ICD-10-CM | POA: Diagnosis not present

## 2021-02-08 DIAGNOSIS — M9905 Segmental and somatic dysfunction of pelvic region: Secondary | ICD-10-CM | POA: Diagnosis not present

## 2021-02-08 DIAGNOSIS — M62830 Muscle spasm of back: Secondary | ICD-10-CM | POA: Diagnosis not present

## 2021-02-08 DIAGNOSIS — M9903 Segmental and somatic dysfunction of lumbar region: Secondary | ICD-10-CM | POA: Diagnosis not present

## 2021-02-08 DIAGNOSIS — M5136 Other intervertebral disc degeneration, lumbar region: Secondary | ICD-10-CM | POA: Diagnosis not present

## 2021-02-11 DIAGNOSIS — L821 Other seborrheic keratosis: Secondary | ICD-10-CM | POA: Diagnosis not present

## 2021-02-11 DIAGNOSIS — C44311 Basal cell carcinoma of skin of nose: Secondary | ICD-10-CM | POA: Diagnosis not present

## 2021-02-11 DIAGNOSIS — D485 Neoplasm of uncertain behavior of skin: Secondary | ICD-10-CM | POA: Diagnosis not present

## 2021-03-14 DIAGNOSIS — M9903 Segmental and somatic dysfunction of lumbar region: Secondary | ICD-10-CM | POA: Diagnosis not present

## 2021-03-14 DIAGNOSIS — M62830 Muscle spasm of back: Secondary | ICD-10-CM | POA: Diagnosis not present

## 2021-03-14 DIAGNOSIS — M9905 Segmental and somatic dysfunction of pelvic region: Secondary | ICD-10-CM | POA: Diagnosis not present

## 2021-03-14 DIAGNOSIS — M5136 Other intervertebral disc degeneration, lumbar region: Secondary | ICD-10-CM | POA: Diagnosis not present

## 2021-03-23 ENCOUNTER — Other Ambulatory Visit: Payer: Self-pay | Admitting: *Deleted

## 2021-03-23 MED ORDER — ALLOPURINOL 300 MG PO TABS: 300.0000 mg | ORAL_TABLET | Freq: Every day | ORAL | 3 refills | Status: DC

## 2021-03-28 ENCOUNTER — Other Ambulatory Visit: Payer: Self-pay

## 2021-03-28 DIAGNOSIS — L300 Nummular dermatitis: Secondary | ICD-10-CM

## 2021-03-28 DIAGNOSIS — L219 Seborrheic dermatitis, unspecified: Secondary | ICD-10-CM

## 2021-03-28 DIAGNOSIS — C44311 Basal cell carcinoma of skin of nose: Secondary | ICD-10-CM | POA: Diagnosis not present

## 2021-03-28 MED ORDER — KETOCONAZOLE 2 % EX SHAM: MEDICATED_SHAMPOO | CUTANEOUS | 0 refills | Status: DC

## 2021-03-28 MED ORDER — CLOBETASOL PROPIONATE 0.05 % EX SOLN: CUTANEOUS | 0 refills | Status: AC

## 2021-04-12 ENCOUNTER — Other Ambulatory Visit: Payer: Self-pay

## 2021-04-12 DIAGNOSIS — L219 Seborrheic dermatitis, unspecified: Secondary | ICD-10-CM

## 2021-04-12 MED ORDER — KETOCONAZOLE 2 % EX SHAM: MEDICATED_SHAMPOO | CUTANEOUS | 0 refills | Status: AC

## 2021-04-12 NOTE — Progress Notes (Signed)
New RX sent in for Ketoconazole Shampoo to clarify instructions.

## 2021-04-18 DIAGNOSIS — M9903 Segmental and somatic dysfunction of lumbar region: Secondary | ICD-10-CM | POA: Diagnosis not present

## 2021-04-18 DIAGNOSIS — M62830 Muscle spasm of back: Secondary | ICD-10-CM | POA: Diagnosis not present

## 2021-04-18 DIAGNOSIS — M5136 Other intervertebral disc degeneration, lumbar region: Secondary | ICD-10-CM | POA: Diagnosis not present

## 2021-04-18 DIAGNOSIS — M9905 Segmental and somatic dysfunction of pelvic region: Secondary | ICD-10-CM | POA: Diagnosis not present

## 2021-04-26 DIAGNOSIS — L905 Scar conditions and fibrosis of skin: Secondary | ICD-10-CM | POA: Diagnosis not present

## 2021-04-26 DIAGNOSIS — L814 Other melanin hyperpigmentation: Secondary | ICD-10-CM | POA: Diagnosis not present

## 2021-04-26 DIAGNOSIS — L820 Inflamed seborrheic keratosis: Secondary | ICD-10-CM | POA: Diagnosis not present

## 2021-04-26 DIAGNOSIS — Z85828 Personal history of other malignant neoplasm of skin: Secondary | ICD-10-CM | POA: Diagnosis not present

## 2021-04-26 DIAGNOSIS — D1801 Hemangioma of skin and subcutaneous tissue: Secondary | ICD-10-CM | POA: Diagnosis not present

## 2021-05-10 DIAGNOSIS — L820 Inflamed seborrheic keratosis: Secondary | ICD-10-CM | POA: Diagnosis not present

## 2021-05-16 DIAGNOSIS — M9903 Segmental and somatic dysfunction of lumbar region: Secondary | ICD-10-CM | POA: Diagnosis not present

## 2021-05-16 DIAGNOSIS — M5136 Other intervertebral disc degeneration, lumbar region: Secondary | ICD-10-CM | POA: Diagnosis not present

## 2021-05-16 DIAGNOSIS — M62830 Muscle spasm of back: Secondary | ICD-10-CM | POA: Diagnosis not present

## 2021-05-16 DIAGNOSIS — M9905 Segmental and somatic dysfunction of pelvic region: Secondary | ICD-10-CM | POA: Diagnosis not present

## 2021-06-07 IMAGING — MG MM DIGITAL SCREENING BILAT W/ TOMO AND CAD
8 series · 8 of 24 positions shown · non-contrast
Comparison: Previous exam(s).

CLINICAL DATA: Screening.

EXAM:
DIGITAL SCREENING BILATERAL MAMMOGRAM WITH TOMOSYNTHESIS AND CAD
TECHNIQUE: Bilateral screening digital craniocaudal and mediolateral oblique
mammograms were obtained. Bilateral screening digital breast
tomosynthesis was performed. The images were evaluated with
computer-aided detection.

[L CC synth-2D]
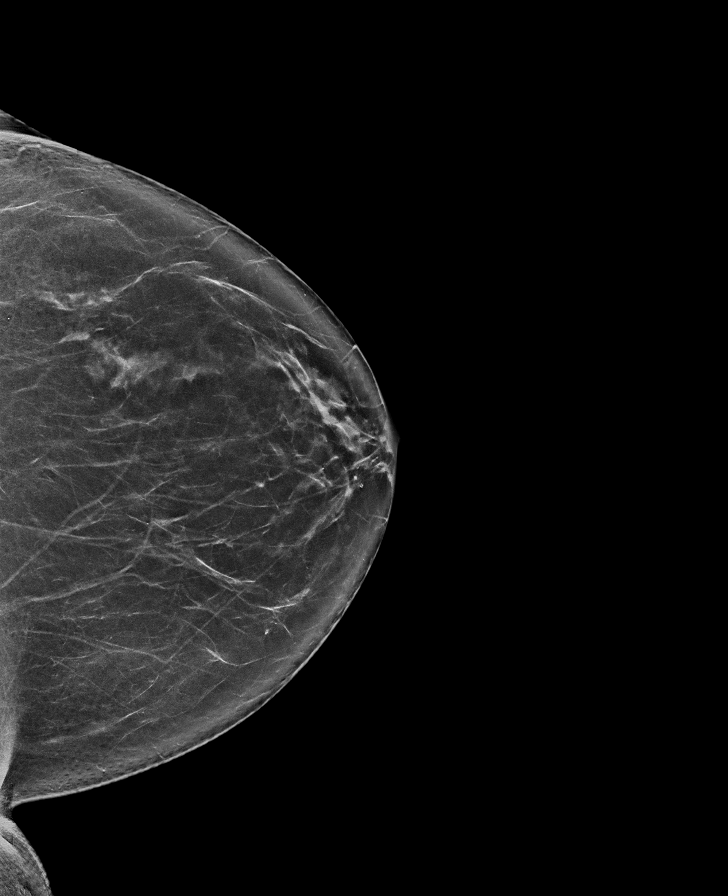

[L MLO synth-2D]
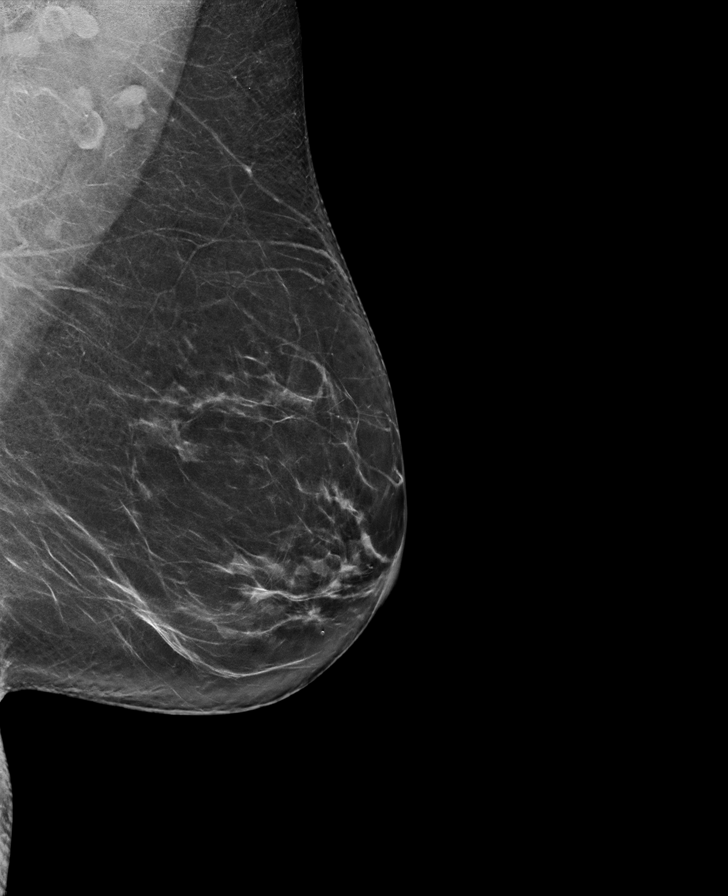

[R CC synth-2D]
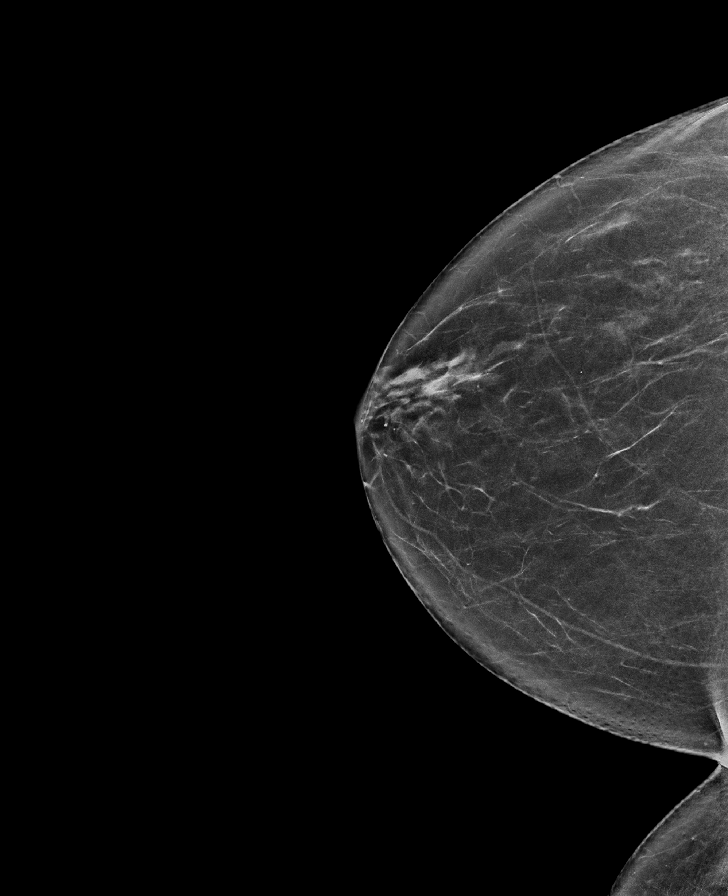

[R MLO synth-2D]
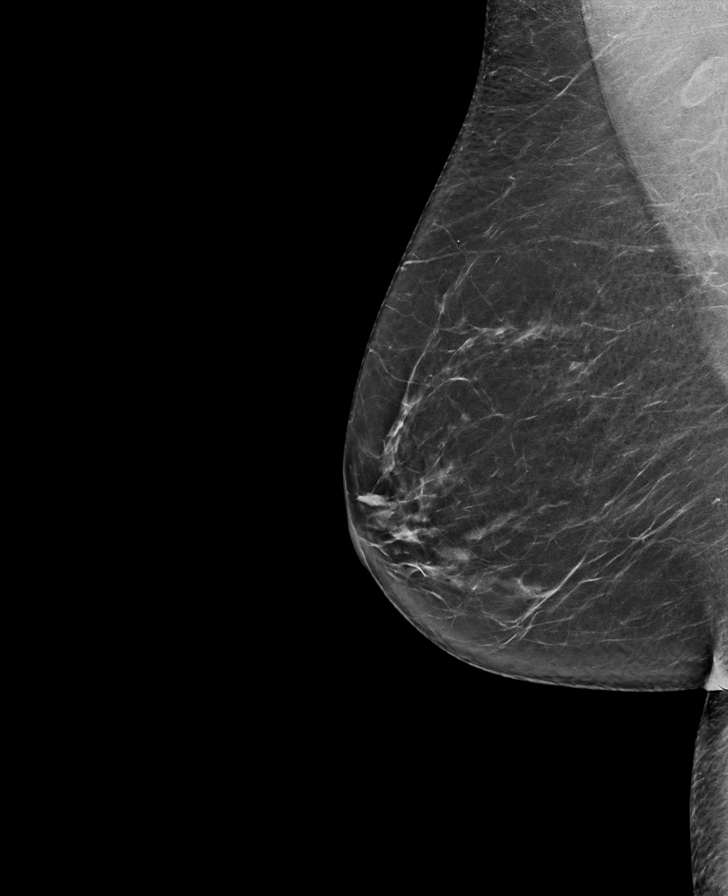

[R MLO tomo · tomo slice 37/73.0]
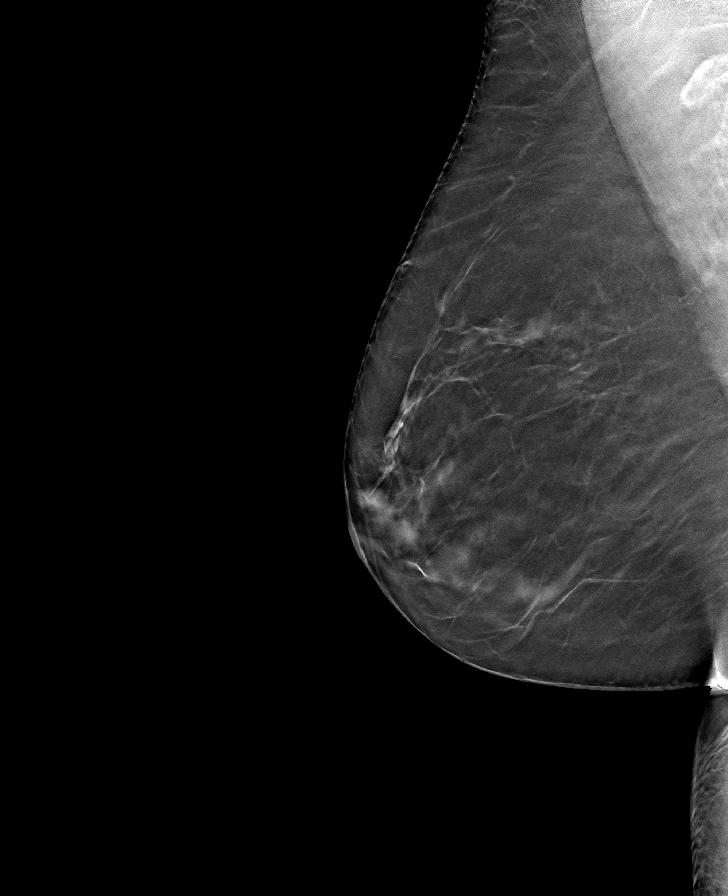

[R CC tomo · tomo slice 36/71.0]
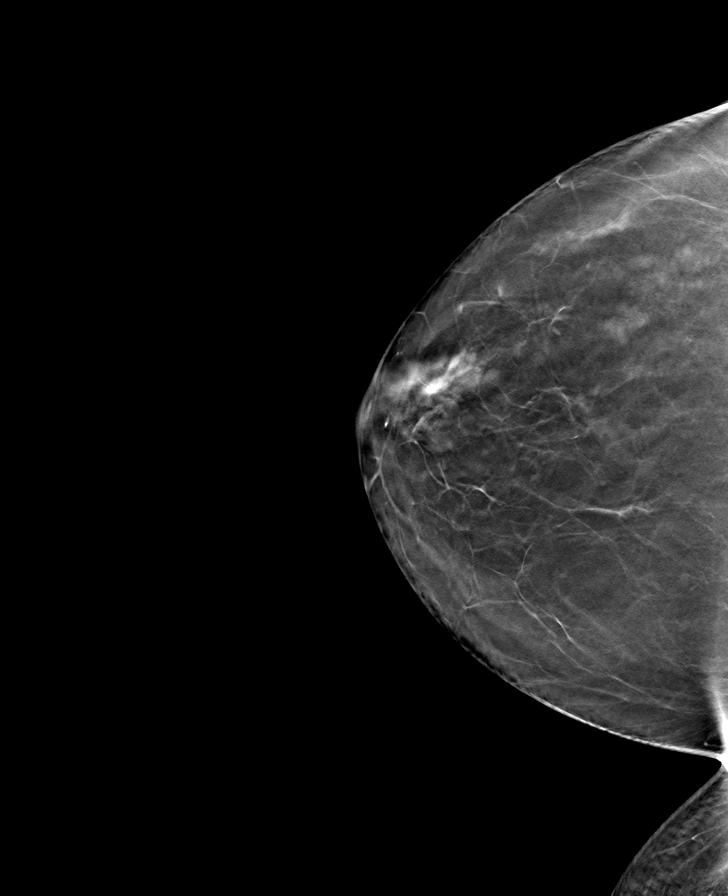

[L CC tomo · tomo slice 37/74.0]
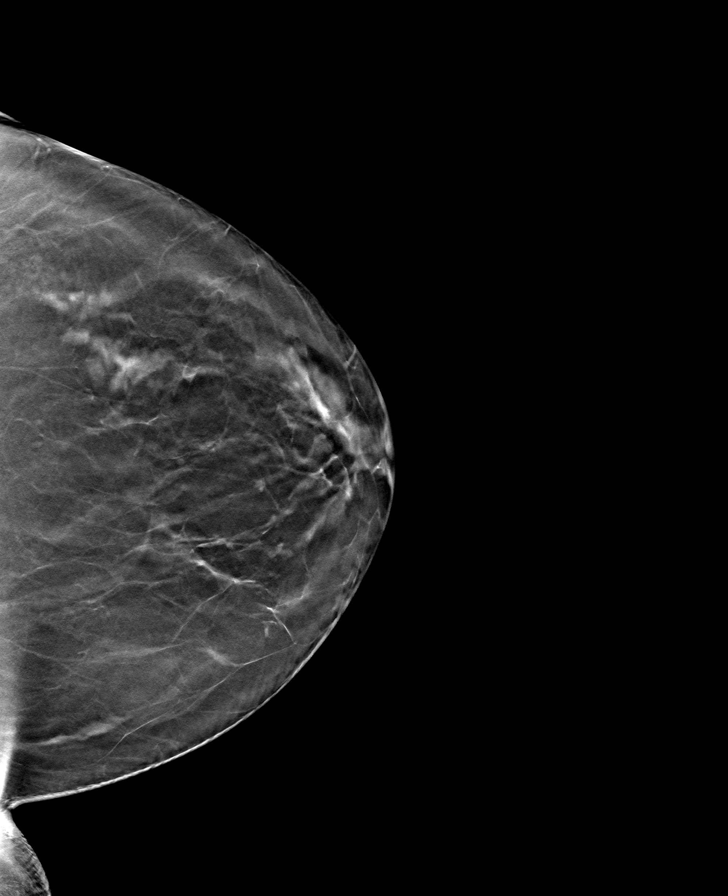

[L MLO tomo · tomo slice 41/80.0]
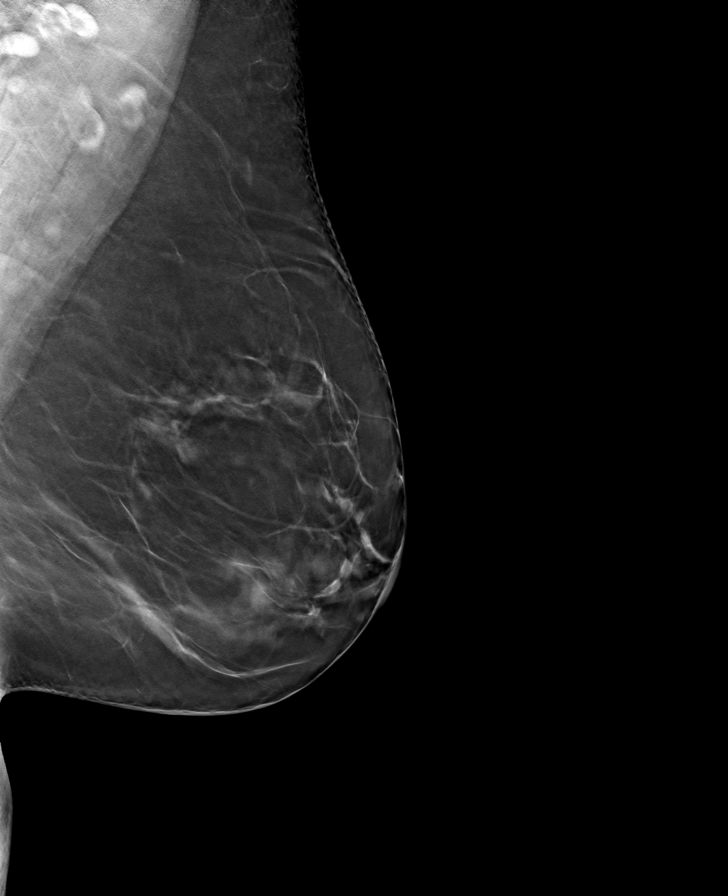

[8 of 24 positions shown; findings below may reference images not displayed]

ACR Breast Density Category b: There are scattered areas of
fibroglandular density.
FINDINGS: There are no findings suspicious for malignancy. The images were
evaluated with computer-aided detection.
IMPRESSION: No mammographic evidence of malignancy. A result letter of this
screening mammogram will be mailed directly to the patient.

RECOMMENDATION:
Screening mammogram in one year. (Code:[id number])

BI-RADS CATEGORY  1: Negative.

## 2021-06-20 DIAGNOSIS — M5136 Other intervertebral disc degeneration, lumbar region: Secondary | ICD-10-CM | POA: Diagnosis not present

## 2021-06-20 DIAGNOSIS — M9905 Segmental and somatic dysfunction of pelvic region: Secondary | ICD-10-CM | POA: Diagnosis not present

## 2021-06-20 DIAGNOSIS — M62830 Muscle spasm of back: Secondary | ICD-10-CM | POA: Diagnosis not present

## 2021-06-20 DIAGNOSIS — M9903 Segmental and somatic dysfunction of lumbar region: Secondary | ICD-10-CM | POA: Diagnosis not present

## 2021-07-19 DIAGNOSIS — M62830 Muscle spasm of back: Secondary | ICD-10-CM | POA: Diagnosis not present

## 2021-07-19 DIAGNOSIS — M9903 Segmental and somatic dysfunction of lumbar region: Secondary | ICD-10-CM | POA: Diagnosis not present

## 2021-07-19 DIAGNOSIS — M9905 Segmental and somatic dysfunction of pelvic region: Secondary | ICD-10-CM | POA: Diagnosis not present

## 2021-07-19 DIAGNOSIS — M5136 Other intervertebral disc degeneration, lumbar region: Secondary | ICD-10-CM | POA: Diagnosis not present

## 2021-08-16 DIAGNOSIS — M62830 Muscle spasm of back: Secondary | ICD-10-CM | POA: Diagnosis not present

## 2021-08-16 DIAGNOSIS — M5136 Other intervertebral disc degeneration, lumbar region: Secondary | ICD-10-CM | POA: Diagnosis not present

## 2021-08-16 DIAGNOSIS — M9905 Segmental and somatic dysfunction of pelvic region: Secondary | ICD-10-CM | POA: Diagnosis not present

## 2021-08-16 DIAGNOSIS — M9903 Segmental and somatic dysfunction of lumbar region: Secondary | ICD-10-CM | POA: Diagnosis not present

## 2021-09-13 DIAGNOSIS — M9903 Segmental and somatic dysfunction of lumbar region: Secondary | ICD-10-CM | POA: Diagnosis not present

## 2021-09-13 DIAGNOSIS — M62830 Muscle spasm of back: Secondary | ICD-10-CM | POA: Diagnosis not present

## 2021-09-13 DIAGNOSIS — M9905 Segmental and somatic dysfunction of pelvic region: Secondary | ICD-10-CM | POA: Diagnosis not present

## 2021-09-13 DIAGNOSIS — M5136 Other intervertebral disc degeneration, lumbar region: Secondary | ICD-10-CM | POA: Diagnosis not present

## 2021-10-18 DIAGNOSIS — M9905 Segmental and somatic dysfunction of pelvic region: Secondary | ICD-10-CM | POA: Diagnosis not present

## 2021-10-18 DIAGNOSIS — M62830 Muscle spasm of back: Secondary | ICD-10-CM | POA: Diagnosis not present

## 2021-10-18 DIAGNOSIS — M5136 Other intervertebral disc degeneration, lumbar region: Secondary | ICD-10-CM | POA: Diagnosis not present

## 2021-10-18 DIAGNOSIS — M9903 Segmental and somatic dysfunction of lumbar region: Secondary | ICD-10-CM | POA: Diagnosis not present

## 2021-11-11 ENCOUNTER — Telehealth: Payer: Self-pay | Admitting: Family Medicine

## 2021-11-14 NOTE — Telephone Encounter (Signed)
Called Virginia Wilson and got her scheduled for 4/11 for labs and 4/18 for CPE.

## 2021-11-14 NOTE — Telephone Encounter (Signed)
Please schedule CPE with fasting labs prior with Dr. [name] for sometime after 12/09/21.

## 2021-11-15 DIAGNOSIS — M5136 Other intervertebral disc degeneration, lumbar region: Secondary | ICD-10-CM | POA: Diagnosis not present

## 2021-11-15 DIAGNOSIS — M9903 Segmental and somatic dysfunction of lumbar region: Secondary | ICD-10-CM | POA: Diagnosis not present

## 2021-11-15 DIAGNOSIS — M62830 Muscle spasm of back: Secondary | ICD-10-CM | POA: Diagnosis not present

## 2021-11-15 DIAGNOSIS — M9905 Segmental and somatic dysfunction of pelvic region: Secondary | ICD-10-CM | POA: Diagnosis not present

## 2021-11-17 ENCOUNTER — Other Ambulatory Visit (INDEPENDENT_AMBULATORY_CARE_PROVIDER_SITE_OTHER): Payer: 59

## 2021-11-17 ENCOUNTER — Other Ambulatory Visit: Payer: Self-pay

## 2021-11-17 DIAGNOSIS — D509 Iron deficiency anemia, unspecified: Secondary | ICD-10-CM

## 2021-11-17 LAB — CBC WITH DIFFERENTIAL/PLATELET
Basophils Absolute: 0.1 10*3/uL (ref 0.0–0.1)
Basophils Relative: 1.1 % (ref 0.0–3.0)
Eosinophils Absolute: 0.2 10*3/uL (ref 0.0–0.7)
Eosinophils Relative: 2 % (ref 0.0–5.0)
HCT: 39.8 % (ref 36.0–46.0)
Hemoglobin: 12.9 g/dL (ref 12.0–15.0)
Lymphocytes Relative: 20.9 % (ref 12.0–46.0)
Lymphs Abs: 1.6 10*3/uL (ref 0.7–4.0)
MCHC: 32.5 g/dL (ref 30.0–36.0)
MCV: 92.5 fl (ref 78.0–100.0)
Monocytes Absolute: 0.3 10*3/uL (ref 0.1–1.0)
Monocytes Relative: 3.9 % (ref 3.0–12.0)
Neutro Abs: 5.6 10*3/uL (ref 1.4–7.7)
Neutrophils Relative %: 72.1 % (ref 43.0–77.0)
Platelets: 280 10*3/uL (ref 150.0–400.0)
RBC: 4.3 Mil/uL (ref 3.87–5.11)
RDW: 14.6 % (ref 11.5–15.5)
WBC: 7.7 10*3/uL (ref 4.0–10.5)

## 2021-11-18 ENCOUNTER — Other Ambulatory Visit: Payer: Self-pay | Admitting: Family Medicine

## 2021-11-21 DIAGNOSIS — D225 Melanocytic nevi of trunk: Secondary | ICD-10-CM | POA: Diagnosis not present

## 2021-11-21 DIAGNOSIS — D485 Neoplasm of uncertain behavior of skin: Secondary | ICD-10-CM | POA: Diagnosis not present

## 2021-11-21 DIAGNOSIS — Z85828 Personal history of other malignant neoplasm of skin: Secondary | ICD-10-CM | POA: Diagnosis not present

## 2021-11-21 DIAGNOSIS — L570 Actinic keratosis: Secondary | ICD-10-CM | POA: Diagnosis not present

## 2021-11-21 DIAGNOSIS — Z08 Encounter for follow-up examination after completed treatment for malignant neoplasm: Secondary | ICD-10-CM | POA: Diagnosis not present

## 2021-11-21 DIAGNOSIS — R229 Localized swelling, mass and lump, unspecified: Secondary | ICD-10-CM | POA: Diagnosis not present

## 2021-11-21 DIAGNOSIS — L814 Other melanin hyperpigmentation: Secondary | ICD-10-CM | POA: Diagnosis not present

## 2021-11-21 DIAGNOSIS — R202 Paresthesia of skin: Secondary | ICD-10-CM | POA: Diagnosis not present

## 2021-11-21 DIAGNOSIS — L578 Other skin changes due to chronic exposure to nonionizing radiation: Secondary | ICD-10-CM | POA: Diagnosis not present

## 2021-11-28 DIAGNOSIS — M9905 Segmental and somatic dysfunction of pelvic region: Secondary | ICD-10-CM | POA: Diagnosis not present

## 2021-11-28 DIAGNOSIS — M9903 Segmental and somatic dysfunction of lumbar region: Secondary | ICD-10-CM | POA: Diagnosis not present

## 2021-11-28 DIAGNOSIS — M62830 Muscle spasm of back: Secondary | ICD-10-CM | POA: Diagnosis not present

## 2021-11-28 DIAGNOSIS — M5136 Other intervertebral disc degeneration, lumbar region: Secondary | ICD-10-CM | POA: Diagnosis not present

## 2021-12-13 DIAGNOSIS — M62830 Muscle spasm of back: Secondary | ICD-10-CM | POA: Diagnosis not present

## 2021-12-13 DIAGNOSIS — M9905 Segmental and somatic dysfunction of pelvic region: Secondary | ICD-10-CM | POA: Diagnosis not present

## 2021-12-13 DIAGNOSIS — M9903 Segmental and somatic dysfunction of lumbar region: Secondary | ICD-10-CM | POA: Diagnosis not present

## 2021-12-13 DIAGNOSIS — M5136 Other intervertebral disc degeneration, lumbar region: Secondary | ICD-10-CM | POA: Diagnosis not present

## 2021-12-15 DIAGNOSIS — L905 Scar conditions and fibrosis of skin: Secondary | ICD-10-CM | POA: Diagnosis not present

## 2021-12-15 DIAGNOSIS — D485 Neoplasm of uncertain behavior of skin: Secondary | ICD-10-CM | POA: Diagnosis not present

## 2022-01-11 ENCOUNTER — Telehealth: Payer: Self-pay | Admitting: Family Medicine

## 2022-01-11 DIAGNOSIS — R7303 Prediabetes: Secondary | ICD-10-CM

## 2022-01-11 DIAGNOSIS — E038 Other specified hypothyroidism: Secondary | ICD-10-CM

## 2022-01-11 DIAGNOSIS — E7800 Pure hypercholesterolemia, unspecified: Secondary | ICD-10-CM

## 2022-01-11 DIAGNOSIS — D509 Iron deficiency anemia, unspecified: Secondary | ICD-10-CM

## 2022-01-11 DIAGNOSIS — M109 Gout, unspecified: Secondary | ICD-10-CM

## 2022-01-11 NOTE — Telephone Encounter (Signed)
-----   Message from Ellamae Sia sent at 01/03/2022  2:34 PM EDT ----- ?Regarding: Lab orders for Tuesday, 4.11.23 ?Patient is scheduled for CPX labs, please order future labs, Thanks , Terri ? ? ?

## 2022-01-17 ENCOUNTER — Other Ambulatory Visit (INDEPENDENT_AMBULATORY_CARE_PROVIDER_SITE_OTHER): Payer: 59

## 2022-01-17 DIAGNOSIS — E038 Other specified hypothyroidism: Secondary | ICD-10-CM

## 2022-01-17 DIAGNOSIS — M109 Gout, unspecified: Secondary | ICD-10-CM | POA: Diagnosis not present

## 2022-01-17 DIAGNOSIS — R7303 Prediabetes: Secondary | ICD-10-CM | POA: Diagnosis not present

## 2022-01-17 DIAGNOSIS — E7800 Pure hypercholesterolemia, unspecified: Secondary | ICD-10-CM

## 2022-01-17 DIAGNOSIS — D509 Iron deficiency anemia, unspecified: Secondary | ICD-10-CM | POA: Diagnosis not present

## 2022-01-17 LAB — COMPREHENSIVE METABOLIC PANEL
ALT: 21 U/L (ref 0–35)
AST: 22 U/L (ref 0–37)
Albumin: 4.2 g/dL (ref 3.5–5.2)
Alkaline Phosphatase: 78 U/L (ref 39–117)
BUN: 22 mg/dL (ref 6–23)
CO2: 32 mEq/L (ref 19–32)
Calcium: 9.5 mg/dL (ref 8.4–10.5)
Chloride: 101 mEq/L (ref 96–112)
Creatinine, Ser: 1.22 mg/dL — ABNORMAL HIGH (ref 0.40–1.20)
GFR: 46.57 mL/min — ABNORMAL LOW (ref >60.00)
Glucose, Bld: 104 mg/dL — ABNORMAL HIGH (ref 70–99)
Potassium: 3.6 mEq/L (ref 3.5–5.1)
Sodium: 141 mEq/L (ref 135–145)
Total Bilirubin: 0.5 mg/dL (ref 0.2–1.2)
Total Protein: 6.6 g/dL (ref 6.0–8.3)

## 2022-01-17 LAB — CBC WITH DIFFERENTIAL/PLATELET
Basophils Absolute: 0 10*3/uL (ref 0.0–0.1)
Basophils Relative: 0.7 % (ref 0.0–3.0)
Eosinophils Absolute: 0.3 10*3/uL (ref 0.0–0.7)
Eosinophils Relative: 4.2 % (ref 0.0–5.0)
HCT: 42.3 % (ref 36.0–46.0)
Hemoglobin: 13.8 g/dL (ref 12.0–15.0)
Lymphocytes Relative: 24.8 % (ref 12.0–46.0)
Lymphs Abs: 1.5 10*3/uL (ref 0.7–4.0)
MCHC: 32.6 g/dL (ref 30.0–36.0)
MCV: 93.5 fl (ref 78.0–100.0)
Monocytes Absolute: 0.4 10*3/uL (ref 0.1–1.0)
Monocytes Relative: 7.4 % (ref 3.0–12.0)
Neutro Abs: 3.8 10*3/uL (ref 1.4–7.7)
Neutrophils Relative %: 62.9 % (ref 43.0–77.0)
Platelets: 296 10*3/uL (ref 150.0–400.0)
RBC: 4.52 Mil/uL (ref 3.87–5.11)
RDW: 15.4 % (ref 11.5–15.5)
WBC: 6 10*3/uL (ref 4.0–10.5)

## 2022-01-17 LAB — HEMOGLOBIN A1C: Hgb A1c MFr Bld: 6.2 % (ref 4.6–6.5)

## 2022-01-17 LAB — URIC ACID: Uric Acid, Serum: 4.9 mg/dL (ref 2.4–7.0)

## 2022-01-17 LAB — LIPID PANEL
Cholesterol: 176 mg/dL (ref 0–200)
HDL: 49 mg/dL (ref >39.00)
NonHDL: 126.53
Total CHOL/HDL Ratio: 4
Triglycerides: 260 mg/dL — ABNORMAL HIGH (ref 0.0–149.0)
VLDL: 52 mg/dL — ABNORMAL HIGH (ref 0.0–40.0)

## 2022-01-17 LAB — TSH: TSH: 5.98 u[IU]/mL — ABNORMAL HIGH (ref 0.35–5.50)

## 2022-01-17 LAB — LDL CHOLESTEROL, DIRECT: Direct LDL: 44 mg/dL

## 2022-01-17 LAB — T4, FREE: Free T4: 0.77 ng/dL (ref 0.60–1.60)

## 2022-01-17 LAB — T3, FREE: T3, Free: 2.8 pg/mL (ref 2.3–4.2)

## 2022-01-17 NOTE — Progress Notes (Signed)
No critical labs need to be addressed urgently. We will discuss labs in detail at upcoming office visit.   

## 2022-01-18 ENCOUNTER — Other Ambulatory Visit: Payer: Self-pay | Admitting: Family Medicine

## 2022-01-24 ENCOUNTER — Ambulatory Visit (INDEPENDENT_AMBULATORY_CARE_PROVIDER_SITE_OTHER): Payer: 59 | Admitting: Family Medicine

## 2022-01-24 ENCOUNTER — Encounter: Payer: Self-pay | Admitting: Family Medicine

## 2022-01-24 VITALS — BP 112/86 | HR 64 | Temp 98.0°F | Ht 66.0 in | Wt 191.3 lb

## 2022-01-24 DIAGNOSIS — Z0000 Encounter for general adult medical examination without abnormal findings: Secondary | ICD-10-CM | POA: Diagnosis not present

## 2022-01-24 DIAGNOSIS — I10 Essential (primary) hypertension: Secondary | ICD-10-CM

## 2022-01-24 DIAGNOSIS — D509 Iron deficiency anemia, unspecified: Secondary | ICD-10-CM | POA: Diagnosis not present

## 2022-01-24 DIAGNOSIS — N181 Chronic kidney disease, stage 1: Secondary | ICD-10-CM

## 2022-01-24 DIAGNOSIS — M109 Gout, unspecified: Secondary | ICD-10-CM

## 2022-01-24 DIAGNOSIS — E7800 Pure hypercholesterolemia, unspecified: Secondary | ICD-10-CM | POA: Diagnosis not present

## 2022-01-24 DIAGNOSIS — E038 Other specified hypothyroidism: Secondary | ICD-10-CM | POA: Diagnosis not present

## 2022-01-24 DIAGNOSIS — R7303 Prediabetes: Secondary | ICD-10-CM | POA: Diagnosis not present

## 2022-01-24 NOTE — Progress Notes (Signed)
? ? Patient ID: Virginia Wilson, female    DOB: 1956-08-24, 66 y.o.   MRN: [id number] ? ?This visit was conducted in person. ? ?BP 112/86   Pulse 64   Temp 98 ?F (36.7 ?C) (Oral)   Ht '5\' 6"'$  (1.676 m)   Wt 191 lb 5 oz (86.8 kg)   SpO2 94%   BMI 30.88 kg/m?   ? ?CC: ?Chief Complaint  ?Patient presents with  ? Annual Exam  ? ? ?Subjective:  ? ?HPI: ?Virginia Wilson is a 66 y.o. female presenting on 01/24/2022 for Annual Exam ?  ?The patient presents for complete physical and review of chronic health problems. He/She also has the following acute concerns today: none ? ? Allergies: using flonase and Xyzal. ? ?Hypertension:  Well-controlled on hydrochlorothiazide 25 mg daily  ?BP Readings from Last 3 Encounters:  ?01/24/22 112/86  ?12/09/20 100/74  ?09/19/19 130/86  ?Using medication without problems or lightheadedness:  none ?Chest pain with exertion: none ?Edema: none ?Short of breath: none ?Average home BPs: ?Other issues: ? ?Elevated Cholesterol: Well-controlled with LDL at goal less than 100 on atorvastatin 40 mg daily. ?Lab Results  ?Component Value Date  ? CHOL 176 01/17/2022  ? HDL 49.00 01/17/2022  ? [place] 61 09/10/2018  ? LDLDIRECT 44.0 01/17/2022  ? TRIG 260.0 (H) 01/17/2022  ? CHOLHDL 4 01/17/2022  ?Using medications without problems: none ?Muscle aches:  ?Diet compliance: moderate ?Exercise: occasionally ?Other complaints:; ? ? Hypothyroid: ? ? Iron def anemia: Resolved hemoglobin in normal range at 13.8. ? ? Gout: No recent flares and uric acid at goal on allopurinol 300 mg daily ?Lab Results  ?Component Value Date  ? LABURIC 4.9 01/17/2022  ? ? ? ? Prediabetes: Stable control with diet. ?Lab Results  ?Component Value Date  ? HGBA1C 6.2 01/17/2022  ? ? ?Hypothyroid Free T3 and free T4 in normal range on levothyroxine 25 mcg daily ?Lab Results  ?Component Value Date  ? TSH 5.98 (H) 01/17/2022  ? ?CKD: Stable Avoiding NSAIDs, drinks lots of water. ? GFR stable at 46 ? Saw Nephrology in 2018.. no further workup  needed, no family history of kidney issues. ?   ? ?Relevant past medical, surgical, family and social history reviewed and updated as indicated. Interim medical history since our last visit reviewed. ?Allergies and medications reviewed and updated. ?Outpatient Medications Prior to Visit  ?Medication Sig Dispense Refill  ? allopurinol (ZYLOPRIM) 300 MG tablet TAKE 1 TABLET BY MOUTH  DAILY 90 tablet 0  ? atorvastatin (LIPITOR) 40 MG tablet TAKE 1 TABLET BY MOUTH  DAILY 90 tablet 0  ? betamethasone dipropionate 0.05 % cream Apply topically 2 (two) times daily. 15 g 2  ? clobetasol (TEMOVATE) 0.05 % external solution Patient to mix solution in jar of CeraVe Cream. Apply all over neck and back twice daily until itchy rash improved. Avoid face, groin, underarms. 50 mL 0  ? EPINEPHrine 0.3 mg/0.3 mL IJ SOAJ injection     ? ferrous sulfate 325 (65 FE) MG tablet Take 325 mg by mouth daily.     ? fluocinonide (LIDEX) 0.05 % external solution Apply 1 application topically daily as needed. 60 mL 3  ? hydrochlorothiazide (HYDRODIURIL) 25 MG tablet TAKE 1 TABLET BY MOUTH  DAILY 90 tablet 0  ? ketoconazole (NIZORAL) 2 % shampoo Apply to scalp and behind ears daily as needed for flares, left sit several minutes before rinsing. 120 mL 0  ? levothyroxine (SYNTHROID) 25 MCG tablet TAKE  1 TABLET BY MOUTH  DAILY BEFORE BREAKFAST 90 tablet 0  ? Omega-3 Fatty Acids (FISH OIL) 1200 MG CAPS Take 1 capsule by mouth daily.    ? omeprazole (PRILOSEC) 40 MG capsule Take 40 mg by mouth daily.    ? pimecrolimus (ELIDEL) 1 % cream Apply topically 2 (two) times daily. Apply 1-2 times a day to eyebrows and around ears as needed for rash. 30 g 2  ? nitroGLYCERIN (NITROSTAT) 0.4 MG SL tablet Place 1 tablet (0.4 mg total) under the tongue every 5 (five) minutes as needed for chest pain. 25 tablet 1  ? ?No facility-administered medications prior to visit.  ?  ? ?Per HPI unless specifically indicated in ROS section below ?Review of Systems   ?Constitutional:  Negative for fatigue and fever.  ?HENT:  Negative for congestion.   ?Eyes:  Negative for pain.  ?Respiratory:  Negative for cough and shortness of breath.   ?Cardiovascular:  Negative for chest pain, palpitations and leg swelling.  ?Gastrointestinal:  Negative for abdominal pain.  ?     Mild occasional right lower quadrant pain, now resolved  ?Genitourinary:  Negative for dysuria and vaginal bleeding.  ?Musculoskeletal:  Negative for back pain.  ?Neurological:  Negative for syncope, light-headedness and headaches.  ?Psychiatric/Behavioral:  Negative for dysphoric mood.   ?Objective:  ?BP 112/86   Pulse 64   Temp 98 ?F (36.7 ?C) (Oral)   Ht '5\' 6"'$  (1.676 m)   Wt 191 lb 5 oz (86.8 kg)   SpO2 94%   BMI 30.88 kg/m?   ?Wt Readings from Last 3 Encounters:  ?01/24/22 191 lb 5 oz (86.8 kg)  ?12/09/20 188 lb 8 oz (85.5 kg)  ?09/19/19 200 lb 8 oz (90.9 kg)  ?  ?  ?Physical Exam ?Vitals and nursing note reviewed.  ?Constitutional:   ?   General: She is not in acute distress. ?   Appearance: Normal appearance. She is well-developed. She is not ill-appearing or toxic-appearing.  ?HENT:  ?   Head: Normocephalic.  ?   Right Ear: Hearing, tympanic membrane, ear canal and external ear normal. Tympanic membrane is not erythematous, retracted or bulging.  ?   Left Ear: Hearing, tympanic membrane, ear canal and external ear normal. Tympanic membrane is not erythematous, retracted or bulging.  ?   Nose: Nose normal. No mucosal edema or rhinorrhea.  ?   Right Sinus: No maxillary sinus tenderness or frontal sinus tenderness.  ?   Left Sinus: No maxillary sinus tenderness or frontal sinus tenderness.  ?   Mouth/Throat:  ?   Pharynx: Uvula midline.  ?Eyes:  ?   General: Lids are normal. Lids are everted, no foreign bodies appreciated.  ?   Conjunctiva/sclera: Conjunctivae normal.  ?   Pupils: Pupils are equal, round, and reactive to light.  ?Neck:  ?   Thyroid: No thyroid mass or thyromegaly.  ?   Vascular: No  carotid bruit.  ?   Trachea: Trachea normal.  ?Cardiovascular:  ?   Rate and Rhythm: Normal rate and regular rhythm.  ?   Pulses: Normal pulses.  ?   Heart sounds: Normal heart sounds, S1 normal and S2 normal. No murmur heard. ?  No friction rub. No gallop.  ?Pulmonary:  ?   Effort: Pulmonary effort is normal. No tachypnea or respiratory distress.  ?   Breath sounds: Normal breath sounds. No decreased breath sounds, wheezing, rhonchi or rales.  ?Abdominal:  ?   General: Bowel sounds are normal.  There is no distension or abdominal bruit.  ?   Palpations: Abdomen is soft. There is no fluid wave or mass.  ?   Tenderness: There is no abdominal tenderness. There is no guarding or rebound.  ?   Hernia: No hernia is present.  ?Musculoskeletal:  ?   Cervical back: Normal range of motion and neck supple.  ?Lymphadenopathy:  ?   Cervical: No cervical adenopathy.  ?Skin: ?   General: Skin is warm and dry.  ?   Findings: No rash.  ?Neurological:  ?   Mental Status: She is alert.  ?   Cranial Nerves: No cranial nerve deficit.  ?   Sensory: No sensory deficit.  ?Psychiatric:     ?   Mood and Affect: Mood is not anxious or depressed.     ?   Speech: Speech normal.     ?   Behavior: Behavior normal. Behavior is cooperative.     ?   Thought Content: Thought content normal.     ?   Judgment: Judgment normal.  ? ?   ?Results for orders placed or performed in visit on 01/17/22  ?T3, free  ?Result Value Ref Range  ? T3, Free 2.8 2.3 - 4.2 pg/mL  ?T4, free  ?Result Value Ref Range  ? Free T4 0.77 0.60 - 1.60 ng/dL  ?TSH  ?Result Value Ref Range  ? TSH 5.98 (H) 0.35 - 5.50 uIU/mL  ?CBC with Differential/Platelet  ?Result Value Ref Range  ? WBC 6.0 4.0 - 10.5 K/uL  ? RBC 4.52 3.87 - 5.11 Mil/uL  ? Hemoglobin 13.8 12.0 - 15.0 g/dL  ? HCT 42.3 36.0 - 46.0 %  ? MCV 93.5 78.0 - 100.0 fl  ? MCHC 32.6 30.0 - 36.0 g/dL  ? RDW 15.4 11.5 - 15.5 %  ? Platelets 296.0 150.0 - 400.0 K/uL  ? Neutrophils Relative % 62.9 43.0 - 77.0 %  ? Lymphocytes  Relative 24.8 12.0 - 46.0 %  ? Monocytes Relative 7.4 3.0 - 12.0 %  ? Eosinophils Relative 4.2 0.0 - 5.0 %  ? Basophils Relative 0.7 0.0 - 3.0 %  ? Neutro Abs 3.8 1.4 - 7.7 K/uL  ? Lymphs Abs 1.5 0.7 - 4.0 K/uL  ? Monocytes Absolute 0.4

## 2022-01-24 NOTE — Assessment & Plan Note (Signed)
Chronic, stable ? ?Continue to push water and avoid NSAIDs. ?

## 2022-01-24 NOTE — Patient Instructions (Addendum)
Olopatadine eye drop for allergies. ? Work on regular exercise and low carbohydrate diet as discussed. ?

## 2022-01-24 NOTE — Assessment & Plan Note (Signed)
Stable, chronic.  Continue current medication.  HCTZ 25 mg daily 

## 2022-01-24 NOTE — Assessment & Plan Note (Signed)
Chronic, stable ? ?No flares on allopurinol 300 mg p.o. daily ?

## 2022-01-24 NOTE — Assessment & Plan Note (Signed)
Stable, chronic.  Continue current medication. ? ? ?Free T3 and free T4 in normal range on levothyroxine 25 mcg daily ?

## 2022-01-24 NOTE — Assessment & Plan Note (Signed)
Chronic, worsening ? ?Trend over the last 4 years noted of increased A1c.  We discussed this in detail as well as low carbohydrate diet.  I recommended dietary change as well as initiation of regular exercise with goal of 150 minutes/week ?

## 2022-01-31 ENCOUNTER — Other Ambulatory Visit: Payer: Self-pay | Admitting: Family Medicine

## 2022-01-31 DIAGNOSIS — M9905 Segmental and somatic dysfunction of pelvic region: Secondary | ICD-10-CM | POA: Diagnosis not present

## 2022-01-31 DIAGNOSIS — M9903 Segmental and somatic dysfunction of lumbar region: Secondary | ICD-10-CM | POA: Diagnosis not present

## 2022-01-31 DIAGNOSIS — M62830 Muscle spasm of back: Secondary | ICD-10-CM | POA: Diagnosis not present

## 2022-01-31 DIAGNOSIS — M5136 Other intervertebral disc degeneration, lumbar region: Secondary | ICD-10-CM | POA: Diagnosis not present

## 2022-02-06 ENCOUNTER — Other Ambulatory Visit: Payer: Self-pay | Admitting: Family Medicine

## 2022-02-28 DIAGNOSIS — M9903 Segmental and somatic dysfunction of lumbar region: Secondary | ICD-10-CM | POA: Diagnosis not present

## 2022-02-28 DIAGNOSIS — M5136 Other intervertebral disc degeneration, lumbar region: Secondary | ICD-10-CM | POA: Diagnosis not present

## 2022-02-28 DIAGNOSIS — M542 Cervicalgia: Secondary | ICD-10-CM | POA: Diagnosis not present

## 2022-02-28 DIAGNOSIS — M9901 Segmental and somatic dysfunction of cervical region: Secondary | ICD-10-CM | POA: Diagnosis not present

## 2022-03-28 DIAGNOSIS — M9903 Segmental and somatic dysfunction of lumbar region: Secondary | ICD-10-CM | POA: Diagnosis not present

## 2022-03-28 DIAGNOSIS — M9901 Segmental and somatic dysfunction of cervical region: Secondary | ICD-10-CM | POA: Diagnosis not present

## 2022-03-28 DIAGNOSIS — M5136 Other intervertebral disc degeneration, lumbar region: Secondary | ICD-10-CM | POA: Diagnosis not present

## 2022-03-28 DIAGNOSIS — M542 Cervicalgia: Secondary | ICD-10-CM | POA: Diagnosis not present

## 2022-04-10 ENCOUNTER — Other Ambulatory Visit: Payer: Self-pay | Admitting: Family Medicine

## 2022-04-24 DIAGNOSIS — M9903 Segmental and somatic dysfunction of lumbar region: Secondary | ICD-10-CM | POA: Diagnosis not present

## 2022-04-24 DIAGNOSIS — M5136 Other intervertebral disc degeneration, lumbar region: Secondary | ICD-10-CM | POA: Diagnosis not present

## 2022-04-24 DIAGNOSIS — M9901 Segmental and somatic dysfunction of cervical region: Secondary | ICD-10-CM | POA: Diagnosis not present

## 2022-04-24 DIAGNOSIS — M542 Cervicalgia: Secondary | ICD-10-CM | POA: Diagnosis not present

## 2022-05-22 DIAGNOSIS — L570 Actinic keratosis: Secondary | ICD-10-CM | POA: Diagnosis not present

## 2022-05-22 DIAGNOSIS — C44729 Squamous cell carcinoma of skin of left lower limb, including hip: Secondary | ICD-10-CM | POA: Diagnosis not present

## 2022-05-22 DIAGNOSIS — L821 Other seborrheic keratosis: Secondary | ICD-10-CM | POA: Diagnosis not present

## 2022-05-22 DIAGNOSIS — Z85828 Personal history of other malignant neoplasm of skin: Secondary | ICD-10-CM | POA: Diagnosis not present

## 2022-05-22 DIAGNOSIS — Z08 Encounter for follow-up examination after completed treatment for malignant neoplasm: Secondary | ICD-10-CM | POA: Diagnosis not present

## 2022-05-22 DIAGNOSIS — L814 Other melanin hyperpigmentation: Secondary | ICD-10-CM | POA: Diagnosis not present

## 2022-05-22 DIAGNOSIS — R229 Localized swelling, mass and lump, unspecified: Secondary | ICD-10-CM | POA: Diagnosis not present

## 2022-06-05 DIAGNOSIS — M9903 Segmental and somatic dysfunction of lumbar region: Secondary | ICD-10-CM | POA: Diagnosis not present

## 2022-06-05 DIAGNOSIS — M62830 Muscle spasm of back: Secondary | ICD-10-CM | POA: Diagnosis not present

## 2022-06-05 DIAGNOSIS — M9905 Segmental and somatic dysfunction of pelvic region: Secondary | ICD-10-CM | POA: Diagnosis not present

## 2022-06-05 DIAGNOSIS — M955 Acquired deformity of pelvis: Secondary | ICD-10-CM | POA: Diagnosis not present

## 2022-06-08 DIAGNOSIS — M9905 Segmental and somatic dysfunction of pelvic region: Secondary | ICD-10-CM | POA: Diagnosis not present

## 2022-06-08 DIAGNOSIS — M9903 Segmental and somatic dysfunction of lumbar region: Secondary | ICD-10-CM | POA: Diagnosis not present

## 2022-06-08 DIAGNOSIS — M62830 Muscle spasm of back: Secondary | ICD-10-CM | POA: Diagnosis not present

## 2022-06-08 DIAGNOSIS — M955 Acquired deformity of pelvis: Secondary | ICD-10-CM | POA: Diagnosis not present

## 2022-06-14 DIAGNOSIS — M9905 Segmental and somatic dysfunction of pelvic region: Secondary | ICD-10-CM | POA: Diagnosis not present

## 2022-06-14 DIAGNOSIS — M955 Acquired deformity of pelvis: Secondary | ICD-10-CM | POA: Diagnosis not present

## 2022-06-14 DIAGNOSIS — M62830 Muscle spasm of back: Secondary | ICD-10-CM | POA: Diagnosis not present

## 2022-06-14 DIAGNOSIS — M9903 Segmental and somatic dysfunction of lumbar region: Secondary | ICD-10-CM | POA: Diagnosis not present

## 2022-06-20 DIAGNOSIS — M955 Acquired deformity of pelvis: Secondary | ICD-10-CM | POA: Diagnosis not present

## 2022-06-20 DIAGNOSIS — M62830 Muscle spasm of back: Secondary | ICD-10-CM | POA: Diagnosis not present

## 2022-06-20 DIAGNOSIS — M9903 Segmental and somatic dysfunction of lumbar region: Secondary | ICD-10-CM | POA: Diagnosis not present

## 2022-06-20 DIAGNOSIS — M9905 Segmental and somatic dysfunction of pelvic region: Secondary | ICD-10-CM | POA: Diagnosis not present

## 2022-07-04 DIAGNOSIS — M9905 Segmental and somatic dysfunction of pelvic region: Secondary | ICD-10-CM | POA: Diagnosis not present

## 2022-07-04 DIAGNOSIS — M9903 Segmental and somatic dysfunction of lumbar region: Secondary | ICD-10-CM | POA: Diagnosis not present

## 2022-07-04 DIAGNOSIS — M955 Acquired deformity of pelvis: Secondary | ICD-10-CM | POA: Diagnosis not present

## 2022-07-04 DIAGNOSIS — M62830 Muscle spasm of back: Secondary | ICD-10-CM | POA: Diagnosis not present

## 2022-08-01 DIAGNOSIS — M955 Acquired deformity of pelvis: Secondary | ICD-10-CM | POA: Diagnosis not present

## 2022-08-01 DIAGNOSIS — M9903 Segmental and somatic dysfunction of lumbar region: Secondary | ICD-10-CM | POA: Diagnosis not present

## 2022-08-01 DIAGNOSIS — M62830 Muscle spasm of back: Secondary | ICD-10-CM | POA: Diagnosis not present

## 2022-08-01 DIAGNOSIS — M9905 Segmental and somatic dysfunction of pelvic region: Secondary | ICD-10-CM | POA: Diagnosis not present

## 2022-08-29 DIAGNOSIS — M9905 Segmental and somatic dysfunction of pelvic region: Secondary | ICD-10-CM | POA: Diagnosis not present

## 2022-08-29 DIAGNOSIS — M955 Acquired deformity of pelvis: Secondary | ICD-10-CM | POA: Diagnosis not present

## 2022-08-29 DIAGNOSIS — M62830 Muscle spasm of back: Secondary | ICD-10-CM | POA: Diagnosis not present

## 2022-08-29 DIAGNOSIS — M9903 Segmental and somatic dysfunction of lumbar region: Secondary | ICD-10-CM | POA: Diagnosis not present

## 2022-09-11 DIAGNOSIS — M9905 Segmental and somatic dysfunction of pelvic region: Secondary | ICD-10-CM | POA: Diagnosis not present

## 2022-09-11 DIAGNOSIS — M9903 Segmental and somatic dysfunction of lumbar region: Secondary | ICD-10-CM | POA: Diagnosis not present

## 2022-09-11 DIAGNOSIS — M62830 Muscle spasm of back: Secondary | ICD-10-CM | POA: Diagnosis not present

## 2022-09-11 DIAGNOSIS — M955 Acquired deformity of pelvis: Secondary | ICD-10-CM | POA: Diagnosis not present

## 2022-09-28 DIAGNOSIS — M9905 Segmental and somatic dysfunction of pelvic region: Secondary | ICD-10-CM | POA: Diagnosis not present

## 2022-09-28 DIAGNOSIS — M9903 Segmental and somatic dysfunction of lumbar region: Secondary | ICD-10-CM | POA: Diagnosis not present

## 2022-09-28 DIAGNOSIS — M955 Acquired deformity of pelvis: Secondary | ICD-10-CM | POA: Diagnosis not present

## 2022-09-28 DIAGNOSIS — M62830 Muscle spasm of back: Secondary | ICD-10-CM | POA: Diagnosis not present

## 2022-10-30 DIAGNOSIS — M62830 Muscle spasm of back: Secondary | ICD-10-CM | POA: Diagnosis not present

## 2022-10-30 DIAGNOSIS — M955 Acquired deformity of pelvis: Secondary | ICD-10-CM | POA: Diagnosis not present

## 2022-10-30 DIAGNOSIS — M9903 Segmental and somatic dysfunction of lumbar region: Secondary | ICD-10-CM | POA: Diagnosis not present

## 2022-10-30 DIAGNOSIS — M9905 Segmental and somatic dysfunction of pelvic region: Secondary | ICD-10-CM | POA: Diagnosis not present

## 2022-11-13 DIAGNOSIS — M9905 Segmental and somatic dysfunction of pelvic region: Secondary | ICD-10-CM | POA: Diagnosis not present

## 2022-11-13 DIAGNOSIS — M955 Acquired deformity of pelvis: Secondary | ICD-10-CM | POA: Diagnosis not present

## 2022-11-13 DIAGNOSIS — M9903 Segmental and somatic dysfunction of lumbar region: Secondary | ICD-10-CM | POA: Diagnosis not present

## 2022-11-13 DIAGNOSIS — M62830 Muscle spasm of back: Secondary | ICD-10-CM | POA: Diagnosis not present

## 2022-11-15 DIAGNOSIS — M62830 Muscle spasm of back: Secondary | ICD-10-CM | POA: Diagnosis not present

## 2022-11-15 DIAGNOSIS — M955 Acquired deformity of pelvis: Secondary | ICD-10-CM | POA: Diagnosis not present

## 2022-11-15 DIAGNOSIS — M9905 Segmental and somatic dysfunction of pelvic region: Secondary | ICD-10-CM | POA: Diagnosis not present

## 2022-11-15 DIAGNOSIS — M9903 Segmental and somatic dysfunction of lumbar region: Secondary | ICD-10-CM | POA: Diagnosis not present

## 2022-11-16 DIAGNOSIS — M955 Acquired deformity of pelvis: Secondary | ICD-10-CM | POA: Diagnosis not present

## 2022-11-16 DIAGNOSIS — M62830 Muscle spasm of back: Secondary | ICD-10-CM | POA: Diagnosis not present

## 2022-11-16 DIAGNOSIS — M9903 Segmental and somatic dysfunction of lumbar region: Secondary | ICD-10-CM | POA: Diagnosis not present

## 2022-11-16 DIAGNOSIS — M9905 Segmental and somatic dysfunction of pelvic region: Secondary | ICD-10-CM | POA: Diagnosis not present

## 2022-11-20 DIAGNOSIS — M62830 Muscle spasm of back: Secondary | ICD-10-CM | POA: Diagnosis not present

## 2022-11-20 DIAGNOSIS — M955 Acquired deformity of pelvis: Secondary | ICD-10-CM | POA: Diagnosis not present

## 2022-11-20 DIAGNOSIS — M9905 Segmental and somatic dysfunction of pelvic region: Secondary | ICD-10-CM | POA: Diagnosis not present

## 2022-11-20 DIAGNOSIS — M9903 Segmental and somatic dysfunction of lumbar region: Secondary | ICD-10-CM | POA: Diagnosis not present

## 2022-11-22 DIAGNOSIS — Z08 Encounter for follow-up examination after completed treatment for malignant neoplasm: Secondary | ICD-10-CM | POA: Diagnosis not present

## 2022-11-22 DIAGNOSIS — L300 Nummular dermatitis: Secondary | ICD-10-CM | POA: Diagnosis not present

## 2022-11-22 DIAGNOSIS — Z85828 Personal history of other malignant neoplasm of skin: Secondary | ICD-10-CM | POA: Diagnosis not present

## 2022-11-22 DIAGNOSIS — L821 Other seborrheic keratosis: Secondary | ICD-10-CM | POA: Diagnosis not present

## 2022-11-22 DIAGNOSIS — I788 Other diseases of capillaries: Secondary | ICD-10-CM | POA: Diagnosis not present

## 2022-11-22 DIAGNOSIS — R229 Localized swelling, mass and lump, unspecified: Secondary | ICD-10-CM | POA: Diagnosis not present

## 2022-11-22 DIAGNOSIS — D229 Melanocytic nevi, unspecified: Secondary | ICD-10-CM | POA: Diagnosis not present

## 2022-11-23 DIAGNOSIS — M955 Acquired deformity of pelvis: Secondary | ICD-10-CM | POA: Diagnosis not present

## 2022-11-23 DIAGNOSIS — M62830 Muscle spasm of back: Secondary | ICD-10-CM | POA: Diagnosis not present

## 2022-11-23 DIAGNOSIS — M9905 Segmental and somatic dysfunction of pelvic region: Secondary | ICD-10-CM | POA: Diagnosis not present

## 2022-11-23 DIAGNOSIS — M9903 Segmental and somatic dysfunction of lumbar region: Secondary | ICD-10-CM | POA: Diagnosis not present

## 2022-11-27 DIAGNOSIS — M62830 Muscle spasm of back: Secondary | ICD-10-CM | POA: Diagnosis not present

## 2022-11-27 DIAGNOSIS — M9905 Segmental and somatic dysfunction of pelvic region: Secondary | ICD-10-CM | POA: Diagnosis not present

## 2022-11-27 DIAGNOSIS — M955 Acquired deformity of pelvis: Secondary | ICD-10-CM | POA: Diagnosis not present

## 2022-11-27 DIAGNOSIS — M9903 Segmental and somatic dysfunction of lumbar region: Secondary | ICD-10-CM | POA: Diagnosis not present

## 2022-12-04 DIAGNOSIS — M62830 Muscle spasm of back: Secondary | ICD-10-CM | POA: Diagnosis not present

## 2022-12-04 DIAGNOSIS — M9905 Segmental and somatic dysfunction of pelvic region: Secondary | ICD-10-CM | POA: Diagnosis not present

## 2022-12-04 DIAGNOSIS — M9903 Segmental and somatic dysfunction of lumbar region: Secondary | ICD-10-CM | POA: Diagnosis not present

## 2022-12-04 DIAGNOSIS — M955 Acquired deformity of pelvis: Secondary | ICD-10-CM | POA: Diagnosis not present

## 2022-12-11 DIAGNOSIS — M9905 Segmental and somatic dysfunction of pelvic region: Secondary | ICD-10-CM | POA: Diagnosis not present

## 2022-12-11 DIAGNOSIS — M955 Acquired deformity of pelvis: Secondary | ICD-10-CM | POA: Diagnosis not present

## 2022-12-11 DIAGNOSIS — M62830 Muscle spasm of back: Secondary | ICD-10-CM | POA: Diagnosis not present

## 2022-12-11 DIAGNOSIS — M9903 Segmental and somatic dysfunction of lumbar region: Secondary | ICD-10-CM | POA: Diagnosis not present

## 2022-12-18 DIAGNOSIS — M955 Acquired deformity of pelvis: Secondary | ICD-10-CM | POA: Diagnosis not present

## 2022-12-18 DIAGNOSIS — M9905 Segmental and somatic dysfunction of pelvic region: Secondary | ICD-10-CM | POA: Diagnosis not present

## 2022-12-18 DIAGNOSIS — M9903 Segmental and somatic dysfunction of lumbar region: Secondary | ICD-10-CM | POA: Diagnosis not present

## 2022-12-18 DIAGNOSIS — M62830 Muscle spasm of back: Secondary | ICD-10-CM | POA: Diagnosis not present

## 2022-12-27 DIAGNOSIS — M955 Acquired deformity of pelvis: Secondary | ICD-10-CM | POA: Diagnosis not present

## 2022-12-27 DIAGNOSIS — M62830 Muscle spasm of back: Secondary | ICD-10-CM | POA: Diagnosis not present

## 2022-12-27 DIAGNOSIS — M9903 Segmental and somatic dysfunction of lumbar region: Secondary | ICD-10-CM | POA: Diagnosis not present

## 2022-12-27 DIAGNOSIS — M9905 Segmental and somatic dysfunction of pelvic region: Secondary | ICD-10-CM | POA: Diagnosis not present

## 2023-01-15 DIAGNOSIS — M9903 Segmental and somatic dysfunction of lumbar region: Secondary | ICD-10-CM | POA: Diagnosis not present

## 2023-01-15 DIAGNOSIS — M9905 Segmental and somatic dysfunction of pelvic region: Secondary | ICD-10-CM | POA: Diagnosis not present

## 2023-01-15 DIAGNOSIS — M955 Acquired deformity of pelvis: Secondary | ICD-10-CM | POA: Diagnosis not present

## 2023-01-15 DIAGNOSIS — M62830 Muscle spasm of back: Secondary | ICD-10-CM | POA: Diagnosis not present

## 2023-01-21 ENCOUNTER — Other Ambulatory Visit: Payer: Self-pay | Admitting: Family Medicine

## 2023-01-22 ENCOUNTER — Telehealth: Payer: Self-pay | Admitting: *Deleted

## 2023-01-22 DIAGNOSIS — E038 Other specified hypothyroidism: Secondary | ICD-10-CM

## 2023-01-22 DIAGNOSIS — R7303 Prediabetes: Secondary | ICD-10-CM

## 2023-01-22 DIAGNOSIS — E7800 Pure hypercholesterolemia, unspecified: Secondary | ICD-10-CM

## 2023-01-22 DIAGNOSIS — D509 Iron deficiency anemia, unspecified: Secondary | ICD-10-CM

## 2023-01-22 DIAGNOSIS — M109 Gout, unspecified: Secondary | ICD-10-CM

## 2023-01-22 NOTE — Telephone Encounter (Signed)
Patient scheduled 4/18 Lab and 4/25 CPE

## 2023-01-22 NOTE — Telephone Encounter (Signed)
Please schedule CPE with fasting labs prior with Dr. Bedsole.  

## 2023-01-22 NOTE — Telephone Encounter (Signed)
-----   Message from Alvina Chou sent at 01/22/2023  3:00 PM EDT ----- Regarding: Lab orders for Thursday, 4.18.24 Patient is scheduled for CPX labs, please order future labs, Thanks , Camelia Eng

## 2023-01-25 ENCOUNTER — Other Ambulatory Visit (INDEPENDENT_AMBULATORY_CARE_PROVIDER_SITE_OTHER): Payer: 59

## 2023-01-25 DIAGNOSIS — E038 Other specified hypothyroidism: Secondary | ICD-10-CM

## 2023-01-25 DIAGNOSIS — E7800 Pure hypercholesterolemia, unspecified: Secondary | ICD-10-CM

## 2023-01-25 DIAGNOSIS — M109 Gout, unspecified: Secondary | ICD-10-CM | POA: Diagnosis not present

## 2023-01-25 DIAGNOSIS — D509 Iron deficiency anemia, unspecified: Secondary | ICD-10-CM

## 2023-01-25 DIAGNOSIS — R7303 Prediabetes: Secondary | ICD-10-CM

## 2023-01-25 LAB — COMPREHENSIVE METABOLIC PANEL
ALT: 19 U/L (ref 0–35)
AST: 22 U/L (ref 0–37)
Albumin: 4.4 g/dL (ref 3.5–5.2)
Alkaline Phosphatase: 84 U/L (ref 39–117)
BUN: 24 mg/dL — ABNORMAL HIGH (ref 6–23)
CO2: 31 mEq/L (ref 19–32)
Calcium: 9.6 mg/dL (ref 8.4–10.5)
Chloride: 99 mEq/L (ref 96–112)
Creatinine, Ser: 1.27 mg/dL — ABNORMAL HIGH (ref 0.40–1.20)
GFR: 44.06 mL/min — ABNORMAL LOW (ref >60.00)
Glucose, Bld: 99 mg/dL (ref 70–99)
Potassium: 3.8 mEq/L (ref 3.5–5.1)
Sodium: 139 mEq/L (ref 135–145)
Total Bilirubin: 0.7 mg/dL (ref 0.2–1.2)
Total Protein: 7.4 g/dL (ref 6.0–8.3)

## 2023-01-25 LAB — CBC WITH DIFFERENTIAL/PLATELET
Basophils Absolute: 0.1 10*3/uL (ref 0.0–0.1)
Basophils Relative: 0.7 % (ref 0.0–3.0)
Eosinophils Absolute: 0.2 10*3/uL (ref 0.0–0.7)
Eosinophils Relative: 2.7 % (ref 0.0–5.0)
HCT: 44 % (ref 36.0–46.0)
Hemoglobin: 14.2 g/dL (ref 12.0–15.0)
Lymphocytes Relative: 23.7 % (ref 12.0–46.0)
Lymphs Abs: 1.7 10*3/uL (ref 0.7–4.0)
MCHC: 32.2 g/dL (ref 30.0–36.0)
MCV: 94.7 fl (ref 78.0–100.0)
Monocytes Absolute: 0.5 10*3/uL (ref 0.1–1.0)
Monocytes Relative: 7.2 % (ref 3.0–12.0)
Neutro Abs: 4.6 10*3/uL (ref 1.4–7.7)
Neutrophils Relative %: 65.7 % (ref 43.0–77.0)
Platelets: 342 10*3/uL (ref 150.0–400.0)
RBC: 4.65 Mil/uL (ref 3.87–5.11)
RDW: 15 % (ref 11.5–15.5)
WBC: 7 10*3/uL (ref 4.0–10.5)

## 2023-01-25 LAB — TSH: TSH: 5.73 u[IU]/mL — ABNORMAL HIGH (ref 0.35–5.50)

## 2023-01-25 LAB — LIPID PANEL
Cholesterol: 150 mg/dL (ref 0–200)
HDL: 48.5 mg/dL (ref >39.00)
NonHDL: 101.42
Total CHOL/HDL Ratio: 3
Triglycerides: 206 mg/dL — ABNORMAL HIGH (ref 0.0–149.0)
VLDL: 41.2 mg/dL — ABNORMAL HIGH (ref 0.0–40.0)

## 2023-01-25 LAB — LDL CHOLESTEROL, DIRECT: Direct LDL: 43 mg/dL

## 2023-01-25 LAB — HEMOGLOBIN A1C: Hgb A1c MFr Bld: 6.1 % (ref 4.6–6.5)

## 2023-01-25 LAB — T3, FREE: T3, Free: 2.7 pg/mL (ref 2.3–4.2)

## 2023-01-25 LAB — URIC ACID: Uric Acid, Serum: 4.4 mg/dL (ref 2.4–7.0)

## 2023-01-25 LAB — T4, FREE: Free T4: 0.75 ng/dL (ref 0.60–1.60)

## 2023-01-25 NOTE — Progress Notes (Signed)
No critical labs need to be addressed urgently. We will discuss labs in detail at upcoming office visit.   

## 2023-01-29 DIAGNOSIS — M9903 Segmental and somatic dysfunction of lumbar region: Secondary | ICD-10-CM | POA: Diagnosis not present

## 2023-01-29 DIAGNOSIS — M62830 Muscle spasm of back: Secondary | ICD-10-CM | POA: Diagnosis not present

## 2023-01-29 DIAGNOSIS — M9905 Segmental and somatic dysfunction of pelvic region: Secondary | ICD-10-CM | POA: Diagnosis not present

## 2023-01-29 DIAGNOSIS — M955 Acquired deformity of pelvis: Secondary | ICD-10-CM | POA: Diagnosis not present

## 2023-02-01 ENCOUNTER — Other Ambulatory Visit: Payer: Self-pay | Admitting: Family Medicine

## 2023-02-01 ENCOUNTER — Ambulatory Visit (INDEPENDENT_AMBULATORY_CARE_PROVIDER_SITE_OTHER): Payer: 59 | Admitting: Family Medicine

## 2023-02-01 ENCOUNTER — Encounter: Payer: Self-pay | Admitting: Family Medicine

## 2023-02-01 VITALS — BP 110/80 | HR 68 | Temp 97.9°F | Ht 66.0 in | Wt 193.0 lb

## 2023-02-01 DIAGNOSIS — E348 Other specified endocrine disorders: Secondary | ICD-10-CM

## 2023-02-01 DIAGNOSIS — E038 Other specified hypothyroidism: Secondary | ICD-10-CM | POA: Diagnosis not present

## 2023-02-01 DIAGNOSIS — D509 Iron deficiency anemia, unspecified: Secondary | ICD-10-CM | POA: Diagnosis not present

## 2023-02-01 DIAGNOSIS — Z0000 Encounter for general adult medical examination without abnormal findings: Secondary | ICD-10-CM

## 2023-02-01 DIAGNOSIS — E7800 Pure hypercholesterolemia, unspecified: Secondary | ICD-10-CM

## 2023-02-01 DIAGNOSIS — I10 Essential (primary) hypertension: Secondary | ICD-10-CM | POA: Diagnosis not present

## 2023-02-01 DIAGNOSIS — R7303 Prediabetes: Secondary | ICD-10-CM

## 2023-02-01 DIAGNOSIS — M109 Gout, unspecified: Secondary | ICD-10-CM | POA: Diagnosis not present

## 2023-02-01 DIAGNOSIS — N181 Chronic kidney disease, stage 1: Secondary | ICD-10-CM | POA: Diagnosis not present

## 2023-02-01 DIAGNOSIS — R0789 Other chest pain: Secondary | ICD-10-CM

## 2023-02-01 DIAGNOSIS — Z1231 Encounter for screening mammogram for malignant neoplasm of breast: Secondary | ICD-10-CM

## 2023-02-01 MED ORDER — LEVOTHYROXINE SODIUM 50 MCG PO TABS: 50.0000 ug | ORAL_TABLET | Freq: Every day | ORAL | 0 refills | Status: DC

## 2023-02-01 NOTE — Assessment & Plan Note (Signed)
Chronic, stable ? ?No flares on allopurinol 300 mg p.o. daily ?

## 2023-02-01 NOTE — Patient Instructions (Addendum)
Trial of increasing prilosec to 2 tabs of 20 mg daily.  Increase levothyroxine   to 50 mcg daily.. return  in 4-6 weeks for repeat thyroid lab.  Please call the location of your choice from the menu below to schedule your Mammogram and/or Bone Density appointment.      [facility]   Phone:  [phone]   [address]                                                                            [place], [place] [address]                                            Services: 3D Mammogram and Bone [facility] at [facility])  Phone:  [phone]   [address]                        [place], [place] [address]                                              Services:  3D Mammogram and Bone Density

## 2023-02-01 NOTE — Assessment & Plan Note (Signed)
Resolved

## 2023-02-01 NOTE — Assessment & Plan Note (Signed)
Chronic,  improving with lifestyle changes, low carb diet and exercise.

## 2023-02-01 NOTE — Progress Notes (Signed)
Patient ID: Virginia Wilson, female    DOB: November 30, 1955, 67 y.o.   MRN: [id number]  This visit was conducted in person.  BP 110/80   Pulse 68   Temp 97.9 F (36.6 C) (Temporal)   Ht  (1.676 m)   Wt 193 lb (87.5 kg)   SpO2 95%   BMI 31.15 kg/m    CC: Chief Complaint  Patient presents with   Annual Exam    Subjective:   HPI: Virginia Wilson is a 67 y.o. female presenting on 02/01/2023 for Annual Exam   The patient presents for complete physical and review of chronic health problems. He/She also has the following acute concerns today: none   Allergies: using flonase and Xyzal.  Hypertension:  Well-controlled on hydrochlorothiazide 25 mg daily  BP Readings from Last 3 Encounters:  02/01/23 110/80  01/24/22 112/86  12/09/20 100/74  Using medication without problems or lightheadedness:  none Chest pain with exertion: none, occ sudden central chest pain at rest or lying down, ongoing years, more frequent in last month... occ acid in throat ( noting GERD once a week, using  prilosec 20 mg Edema: none Short of breath: none Average home BPs: Other issues:  Elevated Cholesterol: Well-controlled with LDL at goal less than 100 on atorvastatin 40 mg daily. Lab Results  Component Value Date   CHOL 150 01/25/2023   HDL 48.50 01/25/2023   LDLCALC 61 09/10/2018   LDLDIRECT 43.0 01/25/2023   TRIG 206.0 (H) 01/25/2023   CHOLHDL 3 01/25/2023  Using medications without problems: none Muscle aches:  Diet compliance: moderate Exercise: occasionally Other complaints:;   Hypothyroid:  on levo 25 mcg daily Lab Results  Component Value Date   TSH 5.73 (H) 01/25/2023      Iron def anemia: Resolved hemoglobin in normal range at 13.8.   Gout: No recent flares and uric acid at goal on allopurinol 300 mg daily Lab Results  Component Value Date   LABURIC 4.4 01/25/2023      Prediabetes: Stable control with diet. Lab Results  Component Value Date   HGBA1C 6.1 01/25/2023   Wt  Readings from Last 3 Encounters:  02/01/23 193 lb (87.5 kg)  01/24/22 191 lb 5 oz (86.8 kg)  12/09/20 188 lb 8 oz (85.5 kg)     Hypothyroid Free T3 and free T4 in normal range on levothyroxine 25 mcg daily Lab Results  Component Value Date   TSH 5.73 (H) 01/25/2023   CKD: Stable Avoiding NSAIDs, drinks lots of water.  GFR stable at 46  Saw Nephrology in 2018.. no further workup needed, no family history of kidney issues.     Relevant past medical, surgical, family and social history reviewed and updated as indicated. Interim medical history since our last visit reviewed. Allergies and medications reviewed and updated. Outpatient Medications Prior to Visit  Medication Sig Dispense Refill   allopurinol (ZYLOPRIM) 300 MG tablet TAKE 1 TABLET BY MOUTH DAILY 90 tablet 3   atorvastatin (LIPITOR) 40 MG tablet TAKE 1 TABLET BY MOUTH DAILY 90 tablet 0   betamethasone dipropionate 0.05 % cream Apply topically 2 (two) times daily. 15 g 2   clobetasol (TEMOVATE) 0.05 % external solution Patient to mix solution in jar of CeraVe Cream. Apply all over neck and back twice daily until itchy rash improved. Avoid face, groin, underarms. 50 mL 0   EPINEPHrine 0.3 mg/0.3 mL IJ SOAJ injection      ferrous sulfate 325 (65 FE) MG  tablet Take 325 mg by mouth daily.      fluocinonide (LIDEX) 0.05 % external solution Apply 1 application topically daily as needed. 60 mL 3   hydrochlorothiazide (HYDRODIURIL) 25 MG tablet TAKE 1 TABLET BY MOUTH DAILY 90 tablet 3   ketoconazole (NIZORAL) 2 % shampoo Apply to scalp and behind ears daily as needed for flares, left sit several minutes before rinsing. 120 mL 0   Omega-3 Fatty Acids (FISH OIL) 1200 MG CAPS Take 1 capsule by mouth daily.     omeprazole (PRILOSEC) 40 MG capsule Take 40 mg by mouth daily.     pimecrolimus (ELIDEL) 1 % cream Apply topically 2 (two) times daily. Apply 1-2 times a day to eyebrows and around ears as needed for rash. 30 g 2   levothyroxine  (SYNTHROID) 25 MCG tablet TAKE 1 TABLET BY MOUTH DAILY  BEFORE BREAKFAST 90 tablet 0   nitroGLYCERIN (NITROSTAT) 0.4 MG SL tablet Place 1 tablet (0.4 mg total) under the tongue every 5 (five) minutes as needed for chest pain. 25 tablet 1   No facility-administered medications prior to visit.     Per HPI unless specifically indicated in ROS section below Review of Systems  Constitutional:  Negative for fatigue and fever.  HENT:  Negative for congestion.   Eyes:  Negative for pain.  Respiratory:  Negative for cough and shortness of breath.   Cardiovascular:  Negative for chest pain, palpitations and leg swelling.  Gastrointestinal:  Negative for abdominal pain.       Mild occasional right lower quadrant pain, now resolved  Genitourinary:  Negative for dysuria and vaginal bleeding.  Musculoskeletal:  Negative for back pain.  Neurological:  Negative for syncope, light-headedness and headaches.  Psychiatric/Behavioral:  Negative for dysphoric mood.    Objective:  BP 110/80   Pulse 68   Temp 97.9 F (36.6 C) (Temporal)   Ht  (1.676 m)   Wt 193 lb (87.5 kg)   SpO2 95%   BMI 31.15 kg/m   Wt Readings from Last 3 Encounters:  02/01/23 193 lb (87.5 kg)  01/24/22 191 lb 5 oz (86.8 kg)  12/09/20 188 lb 8 oz (85.5 kg)      Physical Exam Vitals and nursing note reviewed.  Constitutional:      General: She is not in acute distress.    Appearance: Normal appearance. She is well-developed. She is not ill-appearing or toxic-appearing.  HENT:     Head: Normocephalic.     Right Ear: Hearing, tympanic membrane, ear canal and external ear normal. Tympanic membrane is not erythematous, retracted or bulging.     Left Ear: Hearing, tympanic membrane, ear canal and external ear normal. Tympanic membrane is not erythematous, retracted or bulging.     Nose: Nose normal. No mucosal edema or rhinorrhea.     Right Sinus: No maxillary sinus tenderness or frontal sinus tenderness.     Left Sinus:  No maxillary sinus tenderness or frontal sinus tenderness.     Mouth/Throat:     Pharynx: Uvula midline.  Eyes:     General: Lids are normal. Lids are everted, no foreign bodies appreciated.     Conjunctiva/sclera: Conjunctivae normal.     Pupils: Pupils are equal, round, and reactive to light.  Neck:     Thyroid: No thyroid mass or thyromegaly.     Vascular: No carotid bruit.     Trachea: Trachea normal.  Cardiovascular:     Rate and Rhythm: Normal rate and regular  rhythm.     Pulses: Normal pulses.     Heart sounds: Normal heart sounds, S1 normal and S2 normal. No murmur heard.    No friction rub. No gallop.  Pulmonary:     Effort: Pulmonary effort is normal. No tachypnea or respiratory distress.     Breath sounds: Normal breath sounds. No decreased breath sounds, wheezing, rhonchi or rales.  Abdominal:     General: Bowel sounds are normal. There is no distension or abdominal bruit.     Palpations: Abdomen is soft. There is no fluid wave or mass.     Tenderness: There is no abdominal tenderness. There is no guarding or rebound.     Hernia: No hernia is present.  Musculoskeletal:     Cervical back: Normal range of motion and neck supple.  Lymphadenopathy:     Cervical: No cervical adenopathy.  Skin:    General: Skin is warm and dry.     Findings: No rash.  Neurological:     Mental Status: She is alert.     Cranial Nerves: No cranial nerve deficit.     Sensory: No sensory deficit.  Psychiatric:        Mood and Affect: Mood is not anxious or depressed.        Speech: Speech normal.        Behavior: Behavior normal. Behavior is cooperative.        Thought Content: Thought content normal.        Judgment: Judgment normal.       Results for orders placed or performed in visit on 01/25/23  Hemoglobin A1c  Result Value Ref Range   Hgb A1c MFr Bld 6.1 4.6 - 6.5 %  T3, free  Result Value Ref Range   T3, Free 2.7 2.3 - 4.2 pg/mL  T4, free  Result Value Ref Range   Free T4  0.75 0.60 - 1.60 ng/dL  Uric Acid  Result Value Ref Range   Uric Acid, Serum 4.4 2.4 - 7.0 mg/dL  TSH  Result Value Ref Range   TSH 5.73 (H) 0.35 - 5.50 uIU/mL  Lipid panel  Result Value Ref Range   Cholesterol 150 0 - 200 mg/dL   Triglycerides 782.9 (H) 0.0 - 149.0 mg/dL   HDL 56.21 >30.86 mg/dL   VLDL 57.8 (H) 0.0 - 46.9 mg/dL   Total CHOL/HDL Ratio 3    NonHDL 101.42   Comprehensive metabolic panel  Result Value Ref Range   Sodium 139 135 - 145 mEq/L   Potassium 3.8 3.5 - 5.1 mEq/L   Chloride 99 96 - 112 mEq/L   CO2 31 19 - 32 mEq/L   Glucose, Bld 99 70 - 99 mg/dL   BUN 24 (H) 6 - 23 mg/dL   Creatinine, Ser 6.29 (H) 0.40 - 1.20 mg/dL   Total Bilirubin 0.7 0.2 - 1.2 mg/dL   Alkaline Phosphatase 84 39 - 117 U/L   AST 22 0 - 37 U/L   ALT 19 0 - 35 U/L   Total Protein 7.4 6.0 - 8.3 g/dL   Albumin 4.4 3.5 - 5.2 g/dL   GFR 52.84 (L) >13.24 mL/min   Calcium 9.6 8.4 - 10.5 mg/dL  CBC with Differential/Platelet  Result Value Ref Range   WBC 7.0 4.0 - 10.5 K/uL   RBC 4.65 3.87 - 5.11 Mil/uL   Hemoglobin 14.2 12.0 - 15.0 g/dL   HCT 40.1 02.7 - 25.3 %   MCV 94.7 78.0 - 100.0 fl  MCHC 32.2 30.0 - 36.0 g/dL   RDW 16.1 09.6 - 04.5 %   Platelets 342.0 150.0 - 400.0 K/uL   Neutrophils Relative % 65.7 43.0 - 77.0 %   Lymphocytes Relative 23.7 12.0 - 46.0 %   Monocytes Relative 7.2 3.0 - 12.0 %   Eosinophils Relative 2.7 0.0 - 5.0 %   Basophils Relative 0.7 0.0 - 3.0 %   Neutro Abs 4.6 1.4 - 7.7 K/uL   Lymphs Abs 1.7 0.7 - 4.0 K/uL   Monocytes Absolute 0.5 0.1 - 1.0 K/uL   Eosinophils Absolute 0.2 0.0 - 0.7 K/uL   Basophils Absolute 0.1 0.0 - 0.1 K/uL  LDL cholesterol, direct  Result Value Ref Range   Direct LDL 43.0 mg/dL    This visit occurred during the SARS-CoV-2 public health emergency.  Safety protocols were in place, including screening questions prior to the visit, additional usage of staff PPE, and extensive cleaning of exam room while observing appropriate contact  time as indicated for disinfecting solutions.   COVID 19 screen:  No recent travel or known exposure to COVID19 The patient denies respiratory symptoms of COVID 19 at this time. The importance of social distancing was discussed today.   Assessment and Plan   The patient's preventative maintenance and recommended screening tests for an annual wellness exam were reviewed in full today. Brought up to date unless services declined.  Counselled on the importance of diet, exercise, and its role in overall health and mortality. The patient's FH and SH was reviewed, including their home life, tobacco status, and drug and alcohol status.     PAP/DVE: 09/2019 nml, no HPV, Asymptomatic , no family history of ovarian or endometrial cancer, no further indicated give age. Mammo: nml 2022, plan repeat every 2 years.   DEXA: start at age 62 given low risk. DUE.Marland Kitchen  Colon:colonoscopy 06/2017 polyps, tics: repeat in 5 years.... she declines further evaluation. Vaccines: Uptodate. Refused flu vaccine, shingrix and Prevnar 20, COVID x 3  Booster due Hep C: neg WUJ:[id number]. Nonsmoker No ETOH use.  Problem List Items Addressed This Visit     Atypical chest pain   Chronic renal insufficiency, stage I    Stable,  Estimated Creatinine Clearance: 48.6 mL/min (A) (by C-G formula based on SCr of 1.27 mg/dL (H)).  Has seen renal in past.. felt secondary to HTN.       Essential hypertension, benign    Stable, chronic.  Continue current medication.    HCTZ 25 mg daily      Gout with manifestations    Chronic, stable  No flares on allopurinol 300 mg p.o. daily      HYPERCHOLESTEROLEMIA    Stable, chronic.  Continue current medication.   Atorvastatin 40 mg daily        Hypothyroidism    Free T3 and free T4 in normal range but low normal and TSH higher than goal on levothyroxine 25 mcg daily.  She is tired so we will increase the levothyroxine to 50 mcg daily. Recheck free T3 and free T4 and  TSH in 4 to 6 weeks.      Relevant Medications   levothyroxine (SYNTHROID) 50 MCG tablet   Other Relevant Orders   T4, free   T3, free   TSH   Iron deficiency anemia    Resolved.      Prediabetes    Chronic,  improving with lifestyle changes, low carb diet and exercise.        Other  Visit Diagnoses     Routine general medical examination at a health care facility    -  Primary   Estradiol deficiency       Relevant Orders   DG Bone Density      Kerby Nora, MD

## 2023-02-01 NOTE — Assessment & Plan Note (Addendum)
Free T3 and free T4 in normal range but low normal and TSH higher than goal on levothyroxine 25 mcg daily.  She is tired so we will increase the levothyroxine to 50 mcg daily. Recheck free T3 and free T4 and TSH in 4 to 6 weeks.

## 2023-02-01 NOTE — Assessment & Plan Note (Addendum)
Stable,  Estimated Creatinine Clearance: 48.6 mL/min (A) (by C-G formula based on SCr of 1.27 mg/dL (H)).  Has seen renal in past.. felt secondary to HTN.

## 2023-02-01 NOTE — Assessment & Plan Note (Signed)
Stable, chronic.  Continue current medication.  HCTZ 25 mg daily 

## 2023-02-01 NOTE — Assessment & Plan Note (Signed)
Stable, chronic.  Continue current medication.   Atorvastatin 40 mg daily. 

## 2023-02-02 ENCOUNTER — Ambulatory Visit
Admission: RE | Admit: 2023-02-02 | Discharge: 2023-02-02 | Disposition: A | Discharge status: Home / Self Care | Payer: 59 | Source: Ambulatory Visit | Attending: Family Medicine | Admitting: Family Medicine

## 2023-02-02 DIAGNOSIS — Z780 Asymptomatic menopausal state: Secondary | ICD-10-CM | POA: Diagnosis not present

## 2023-02-02 DIAGNOSIS — E348 Other specified endocrine disorders: Secondary | ICD-10-CM | POA: Insufficient documentation

## 2023-02-02 DIAGNOSIS — Z1231 Encounter for screening mammogram for malignant neoplasm of breast: Secondary | ICD-10-CM | POA: Insufficient documentation

## 2023-02-19 DIAGNOSIS — M9903 Segmental and somatic dysfunction of lumbar region: Secondary | ICD-10-CM | POA: Diagnosis not present

## 2023-02-19 DIAGNOSIS — M62830 Muscle spasm of back: Secondary | ICD-10-CM | POA: Diagnosis not present

## 2023-02-19 DIAGNOSIS — M9905 Segmental and somatic dysfunction of pelvic region: Secondary | ICD-10-CM | POA: Diagnosis not present

## 2023-02-19 DIAGNOSIS — M955 Acquired deformity of pelvis: Secondary | ICD-10-CM | POA: Diagnosis not present

## 2023-02-22 ENCOUNTER — Telehealth: Payer: Self-pay | Admitting: Family Medicine

## 2023-02-22 DIAGNOSIS — D509 Iron deficiency anemia, unspecified: Secondary | ICD-10-CM

## 2023-02-22 NOTE — Telephone Encounter (Signed)
-----   Message from Alvina Chou sent at 02/22/2023  3:42 PM EDT ----- Regarding: Lab orders for Thursday, 5.30.24 Appointment comment says, thyroid and iron. Only have orders for thyroid. Please orders any additional labs, thanks

## 2023-03-08 ENCOUNTER — Other Ambulatory Visit (INDEPENDENT_AMBULATORY_CARE_PROVIDER_SITE_OTHER): Payer: 59

## 2023-03-08 DIAGNOSIS — E038 Other specified hypothyroidism: Secondary | ICD-10-CM | POA: Diagnosis not present

## 2023-03-08 DIAGNOSIS — D509 Iron deficiency anemia, unspecified: Secondary | ICD-10-CM

## 2023-03-08 LAB — CBC WITH DIFFERENTIAL/PLATELET
Basophils Absolute: 0.1 10*3/uL (ref 0.0–0.1)
Basophils Relative: 1.4 % (ref 0.0–3.0)
Eosinophils Absolute: 0.2 10*3/uL (ref 0.0–0.7)
Eosinophils Relative: 2.8 % (ref 0.0–5.0)
HCT: 41.4 % (ref 36.0–46.0)
Hemoglobin: 13.3 g/dL (ref 12.0–15.0)
Lymphocytes Relative: 21.5 % (ref 12.0–46.0)
Lymphs Abs: 1.6 10*3/uL (ref 0.7–4.0)
MCHC: 32.1 g/dL (ref 30.0–36.0)
MCV: 94.1 fl (ref 78.0–100.0)
Monocytes Absolute: 0.6 10*3/uL (ref 0.1–1.0)
Monocytes Relative: 7.4 % (ref 3.0–12.0)
Neutro Abs: 5.1 10*3/uL (ref 1.4–7.7)
Neutrophils Relative %: 66.9 % (ref 43.0–77.0)
Platelets: 293 10*3/uL (ref 150.0–400.0)
RBC: 4.4 Mil/uL (ref 3.87–5.11)
RDW: 15.1 % (ref 11.5–15.5)
WBC: 7.6 10*3/uL (ref 4.0–10.5)

## 2023-03-08 LAB — IBC + FERRITIN
Ferritin: 30.6 ng/mL (ref 10.0–291.0)
Iron: 68 ug/dL (ref 42–145)
Saturation Ratios: 19.7 % — ABNORMAL LOW (ref 20.0–50.0)
TIBC: 344.4 ug/dL (ref 250.0–450.0)
Transferrin: 246 mg/dL (ref 212.0–360.0)

## 2023-03-08 LAB — T3, FREE: T3, Free: 2.5 pg/mL (ref 2.3–4.2)

## 2023-03-08 LAB — TSH: TSH: 1.88 u[IU]/mL (ref 0.35–5.50)

## 2023-03-08 LAB — T4, FREE: Free T4: 0.81 ng/dL (ref 0.60–1.60)

## 2023-03-19 ENCOUNTER — Other Ambulatory Visit: Payer: Self-pay | Admitting: Family Medicine

## 2023-03-19 DIAGNOSIS — M62830 Muscle spasm of back: Secondary | ICD-10-CM | POA: Diagnosis not present

## 2023-03-19 DIAGNOSIS — M9903 Segmental and somatic dysfunction of lumbar region: Secondary | ICD-10-CM | POA: Diagnosis not present

## 2023-03-19 DIAGNOSIS — M955 Acquired deformity of pelvis: Secondary | ICD-10-CM | POA: Diagnosis not present

## 2023-03-19 DIAGNOSIS — M9905 Segmental and somatic dysfunction of pelvic region: Secondary | ICD-10-CM | POA: Diagnosis not present

## 2023-03-30 ENCOUNTER — Other Ambulatory Visit: Payer: Self-pay | Admitting: Family Medicine

## 2023-04-23 DIAGNOSIS — M955 Acquired deformity of pelvis: Secondary | ICD-10-CM | POA: Diagnosis not present

## 2023-04-23 DIAGNOSIS — M9905 Segmental and somatic dysfunction of pelvic region: Secondary | ICD-10-CM | POA: Diagnosis not present

## 2023-04-23 DIAGNOSIS — M62830 Muscle spasm of back: Secondary | ICD-10-CM | POA: Diagnosis not present

## 2023-04-23 DIAGNOSIS — M9903 Segmental and somatic dysfunction of lumbar region: Secondary | ICD-10-CM | POA: Diagnosis not present

## 2023-05-21 DIAGNOSIS — M62830 Muscle spasm of back: Secondary | ICD-10-CM | POA: Diagnosis not present

## 2023-05-21 DIAGNOSIS — M955 Acquired deformity of pelvis: Secondary | ICD-10-CM | POA: Diagnosis not present

## 2023-05-21 DIAGNOSIS — M9903 Segmental and somatic dysfunction of lumbar region: Secondary | ICD-10-CM | POA: Diagnosis not present

## 2023-05-21 DIAGNOSIS — M9905 Segmental and somatic dysfunction of pelvic region: Secondary | ICD-10-CM | POA: Diagnosis not present

## 2023-05-29 ENCOUNTER — Other Ambulatory Visit: Payer: Self-pay | Admitting: Family Medicine

## 2023-05-30 ENCOUNTER — Other Ambulatory Visit: Payer: Self-pay | Admitting: Family Medicine

## 2023-05-31 ENCOUNTER — Other Ambulatory Visit: Payer: Self-pay

## 2023-05-31 ENCOUNTER — Telehealth: Payer: Self-pay | Admitting: Family Medicine

## 2023-05-31 MED ORDER — LEVOTHYROXINE SODIUM 50 MCG PO TABS
50.0000 ug | ORAL_TABLET | Freq: Every day | ORAL | 3 refills | Status: DC
  Filled 2023-05-31: qty 90, 90d supply, fill #0

## 2023-05-31 MED ORDER — LEVOTHYROXINE SODIUM 50 MCG PO TABS: 50.0000 ug | ORAL_TABLET | Freq: Every day | ORAL | 3 refills | Status: DC

## 2023-05-31 NOTE — Telephone Encounter (Signed)
Refill sent as requested. 

## 2023-05-31 NOTE — Telephone Encounter (Signed)
Prescription Request  05/31/2023  LOV: 02/01/2023  What is the name of the medication or equipment? levothyroxine (SYNTHROID) 50 MCG tablet   Have you contacted your pharmacy to request a refill? Yes   Which pharmacy would yo [facility]) - [place], [place] - [address] [facility] [address] [place] [address] [place] [address] Phone: [phone] Fax: [phone]  [facility] - [place], [place] - [address] [place] [address] Phone: [phone] Fax: [phone]    Patient notified that their request is being sent to the clinical staff for review and that they should receive a response within 2 business days.   Please advise at Mobile [phone] (mobile) Pt call in stated RX need to go to the [facility]

## 2023-06-02 ENCOUNTER — Observation Stay
Admission: EM | Admit: 2023-06-02 | Discharge: 2023-06-03 | Disposition: A | Discharge status: Home / Self Care | Payer: 59 | Attending: Osteopathic Medicine | Admitting: Osteopathic Medicine

## 2023-06-02 ENCOUNTER — Other Ambulatory Visit: Payer: Self-pay

## 2023-06-02 ENCOUNTER — Emergency Department: Payer: 59

## 2023-06-02 DIAGNOSIS — E7800 Pure hypercholesterolemia, unspecified: Secondary | ICD-10-CM | POA: Diagnosis present

## 2023-06-02 DIAGNOSIS — J4541 Moderate persistent asthma with (acute) exacerbation: Secondary | ICD-10-CM

## 2023-06-02 DIAGNOSIS — R0602 Shortness of breath: Secondary | ICD-10-CM | POA: Diagnosis present

## 2023-06-02 DIAGNOSIS — Z79899 Other long term (current) drug therapy: Secondary | ICD-10-CM | POA: Insufficient documentation

## 2023-06-02 DIAGNOSIS — Q401 Congenital hiatus hernia: Secondary | ICD-10-CM | POA: Insufficient documentation

## 2023-06-02 DIAGNOSIS — R0689 Other abnormalities of breathing: Secondary | ICD-10-CM | POA: Diagnosis not present

## 2023-06-02 DIAGNOSIS — I700 Atherosclerosis of aorta: Secondary | ICD-10-CM | POA: Diagnosis not present

## 2023-06-02 DIAGNOSIS — Z1152 Encounter for screening for COVID-19: Secondary | ICD-10-CM | POA: Diagnosis not present

## 2023-06-02 DIAGNOSIS — J9601 Acute respiratory failure with hypoxia: Secondary | ICD-10-CM | POA: Diagnosis not present

## 2023-06-02 DIAGNOSIS — I10 Essential (primary) hypertension: Secondary | ICD-10-CM | POA: Diagnosis present

## 2023-06-02 DIAGNOSIS — E039 Hypothyroidism, unspecified: Secondary | ICD-10-CM | POA: Diagnosis not present

## 2023-06-02 DIAGNOSIS — J441 Chronic obstructive pulmonary disease with (acute) exacerbation: Principal | ICD-10-CM | POA: Insufficient documentation

## 2023-06-02 DIAGNOSIS — I129 Hypertensive chronic kidney disease with stage 1 through stage 4 chronic kidney disease, or unspecified chronic kidney disease: Secondary | ICD-10-CM | POA: Insufficient documentation

## 2023-06-02 DIAGNOSIS — M109 Gout, unspecified: Secondary | ICD-10-CM | POA: Diagnosis present

## 2023-06-02 DIAGNOSIS — J9801 Acute bronchospasm: Principal | ICD-10-CM

## 2023-06-02 DIAGNOSIS — R0603 Acute respiratory distress: Secondary | ICD-10-CM | POA: Diagnosis not present

## 2023-06-02 DIAGNOSIS — N1831 Chronic kidney disease, stage 3a: Secondary | ICD-10-CM | POA: Diagnosis not present

## 2023-06-02 DIAGNOSIS — R918 Other nonspecific abnormal finding of lung field: Secondary | ICD-10-CM | POA: Diagnosis not present

## 2023-06-02 DIAGNOSIS — K449 Diaphragmatic hernia without obstruction or gangrene: Secondary | ICD-10-CM | POA: Diagnosis not present

## 2023-06-02 DIAGNOSIS — J45901 Unspecified asthma with (acute) exacerbation: Secondary | ICD-10-CM | POA: Diagnosis present

## 2023-06-02 DIAGNOSIS — I771 Stricture of artery: Secondary | ICD-10-CM | POA: Diagnosis not present

## 2023-06-02 DIAGNOSIS — K219 Gastro-esophageal reflux disease without esophagitis: Secondary | ICD-10-CM

## 2023-06-02 LAB — CBC
HCT: 43.8 % (ref 36.0–46.0)
Hemoglobin: 14.2 g/dL (ref 12.0–15.0)
MCH: 31.1 pg (ref 26.0–34.0)
MCHC: 32.4 g/dL (ref 30.0–36.0)
MCV: 96.1 fL (ref 80.0–100.0)
Platelets: 314 10*3/uL (ref 150–400)
RBC: 4.56 MIL/uL (ref 3.87–5.11)
RDW: 14.6 % (ref 11.5–15.5)
WBC: 9.2 10*3/uL (ref 4.0–10.5)
nRBC: 0 % (ref 0.0–0.2)

## 2023-06-02 LAB — COMPREHENSIVE METABOLIC PANEL
ALT: 19 U/L (ref 0–44)
AST: 25 U/L (ref 15–41)
Albumin: 4.1 g/dL (ref 3.5–5.0)
Alkaline Phosphatase: 67 U/L (ref 38–126)
Anion gap: 8 (ref 5–15)
BUN: 24 mg/dL — ABNORMAL HIGH (ref 8–23)
CO2: 24 mmol/L (ref 22–32)
Calcium: 8.9 mg/dL (ref 8.9–10.3)
Chloride: 105 mmol/L (ref 98–111)
Creatinine, Ser: 1.27 mg/dL — ABNORMAL HIGH (ref 0.44–1.00)
GFR, Estimated: 47 mL/min — ABNORMAL LOW (ref >60)
Glucose, Bld: 140 mg/dL — ABNORMAL HIGH (ref 70–99)
Potassium: 4 mmol/L (ref 3.5–5.1)
Sodium: 137 mmol/L (ref 135–145)
Total Bilirubin: 0.8 mg/dL (ref 0.3–1.2)
Total Protein: 7.2 g/dL (ref 6.5–8.1)

## 2023-06-02 LAB — BRAIN NATRIURETIC PEPTIDE: B Natriuretic Peptide: 102.2 pg/mL — ABNORMAL HIGH (ref 0.0–100.0)

## 2023-06-02 LAB — BLOOD GAS, VENOUS
Acid-Base Excess: 0.3 mmol/L (ref 0.0–2.0)
Bicarbonate: 26.5 mmol/L (ref 20.0–28.0)
O2 Saturation: 86.6 %
Patient temperature: 37
pCO2, Ven: 48 mmHg (ref 44–60)
pH, Ven: 7.35 (ref 7.25–7.43)
pO2, Ven: 54 mmHg — ABNORMAL HIGH (ref 32–45)

## 2023-06-02 LAB — TROPONIN I (HIGH SENSITIVITY): Troponin I (High Sensitivity): 3 ng/L (ref <18)

## 2023-06-02 LAB — SARS CORONAVIRUS 2 BY RT PCR: SARS Coronavirus 2 by RT PCR: NEGATIVE

## 2023-06-02 MED ORDER — ALBUTEROL SULFATE (2.5 MG/3ML) 0.083% IN NEBU: 2.5000 mg | INHALATION_SOLUTION | RESPIRATORY_TRACT | Status: DC | PRN

## 2023-06-02 MED ORDER — ALBUTEROL SULFATE (2.5 MG/3ML) 0.083% IN NEBU
2.5000 mg | INHALATION_SOLUTION | RESPIRATORY_TRACT | Status: DC | PRN
  Filled 2023-06-02: qty 3

## 2023-06-02 MED ORDER — ALBUTEROL SULFATE HFA 108 (90 BASE) MCG/ACT IN AERS
2.0000 | INHALATION_SPRAY | RESPIRATORY_TRACT | Status: DC | PRN
Start: 2023-06-02 — End: 2023-06-02

## 2023-06-02 MED ORDER — METHYLPREDNISOLONE SODIUM SUCC 125 MG IJ SOLR
125.0000 mg | Freq: Once | INTRAMUSCULAR | Status: AC
  Administered 2023-06-02: 125 mg via INTRAVENOUS
  Filled 2023-06-02: qty 2

## 2023-06-02 MED ORDER — ALLOPURINOL 300 MG PO TABS
300.0000 mg | ORAL_TABLET | Freq: Every day | ORAL | Status: DC
  Administered 2023-06-02 – 2023-06-03 (×2): 300 mg via ORAL
  Filled 2023-06-02 (×2): qty 1

## 2023-06-02 MED ORDER — ATORVASTATIN CALCIUM 20 MG PO TABS
40.0000 mg | ORAL_TABLET | Freq: Every day | ORAL | Status: DC
  Administered 2023-06-03: 40 mg via ORAL
  Filled 2023-06-02: qty 2

## 2023-06-02 MED ORDER — HYDRALAZINE HCL 20 MG/ML IJ SOLN: 5.0000 mg | INTRAMUSCULAR | Status: DC | PRN

## 2023-06-02 MED ORDER — ONDANSETRON HCL 4 MG/2ML IJ SOLN: 4.0000 mg | Freq: Three times a day (TID) | INTRAMUSCULAR | Status: DC | PRN

## 2023-06-02 MED ORDER — METHYLPREDNISOLONE SODIUM SUCC 125 MG IJ SOLR
80.0000 mg | Freq: Every day | INTRAMUSCULAR | Status: DC
  Administered 2023-06-02 – 2023-06-03 (×2): 80 mg via INTRAVENOUS
  Filled 2023-06-02 (×2): qty 2

## 2023-06-02 MED ORDER — DM-GUAIFENESIN ER 30-600 MG PO TB12: 1.0000 | ORAL_TABLET | Freq: Two times a day (BID) | ORAL | Status: DC | PRN

## 2023-06-02 MED ORDER — NITROGLYCERIN 0.4 MG SL SUBL: 0.4000 mg | SUBLINGUAL_TABLET | SUBLINGUAL | Status: DC | PRN

## 2023-06-02 MED ORDER — IOHEXOL 350 MG/ML SOLN
75.0000 mL | Freq: Once | INTRAVENOUS | Status: AC | PRN
  Administered 2023-06-02: 75 mL via INTRAVENOUS

## 2023-06-02 MED ORDER — ACETAMINOPHEN 325 MG PO TABS: 650.0000 mg | ORAL_TABLET | Freq: Four times a day (QID) | ORAL | Status: DC | PRN

## 2023-06-02 MED ORDER — ENOXAPARIN SODIUM 40 MG/0.4ML IJ SOSY
40.0000 mg | PREFILLED_SYRINGE | INTRAMUSCULAR | Status: DC
  Administered 2023-06-02: 40 mg via SUBCUTANEOUS
  Filled 2023-06-02: qty 0.4

## 2023-06-02 MED ORDER — IPRATROPIUM-ALBUTEROL 0.5-2.5 (3) MG/3ML IN SOLN
3.0000 mL | RESPIRATORY_TRACT | Status: DC
  Administered 2023-06-02 – 2023-06-03 (×5): 3 mL via RESPIRATORY_TRACT
  Filled 2023-06-02 (×5): qty 3

## 2023-06-02 MED ORDER — LEVOTHYROXINE SODIUM 50 MCG PO TABS
50.0000 ug | ORAL_TABLET | Freq: Every day | ORAL | Status: DC
  Administered 2023-06-03: 50 ug via ORAL
  Filled 2023-06-02: qty 1

## 2023-06-02 MED ORDER — OMEGA-3-ACID ETHYL ESTERS 1 G PO CAPS
1.0000 g | ORAL_CAPSULE | Freq: Every day | ORAL | Status: DC
  Administered 2023-06-03: 1 g via ORAL
  Filled 2023-06-02 (×2): qty 1

## 2023-06-02 MED ORDER — FERROUS SULFATE 325 (65 FE) MG PO TABS
325.0000 mg | ORAL_TABLET | Freq: Every day | ORAL | Status: DC
  Administered 2023-06-03: 325 mg via ORAL
  Filled 2023-06-02 (×2): qty 1

## 2023-06-02 MED ORDER — IPRATROPIUM-ALBUTEROL 0.5-2.5 (3) MG/3ML IN SOLN
9.0000 mL | Freq: Once | RESPIRATORY_TRACT | Status: AC
  Administered 2023-06-02: 9 mL via RESPIRATORY_TRACT
  Filled 2023-06-02: qty 3

## 2023-06-02 MED ORDER — HYDROCHLOROTHIAZIDE 25 MG PO TABS
25.0000 mg | ORAL_TABLET | Freq: Every day | ORAL | Status: DC
  Administered 2023-06-03: 25 mg via ORAL
  Filled 2023-06-02: qty 1

## 2023-06-02 MED ORDER — PANTOPRAZOLE SODIUM 40 MG PO TBEC
40.0000 mg | DELAYED_RELEASE_TABLET | Freq: Every day | ORAL | Status: DC
  Administered 2023-06-03: 40 mg via ORAL
  Filled 2023-06-02: qty 1

## 2023-06-02 MED ORDER — MAGNESIUM SULFATE 2 GM/50ML IV SOLN
2.0000 g | Freq: Once | INTRAVENOUS | Status: AC
  Administered 2023-06-02: 2 g via INTRAVENOUS
  Filled 2023-06-02: qty 50

## 2023-06-02 NOTE — H&P (Signed)
History and Physical    Virginia Wilson NFA:[id number] DOB: 1956-09-10 DOA: 06/02/2023  Referring MD/NP/PA:   PCP: Excell Seltzer, MD   Patient coming from:  The patient is coming from home.     Chief Complaint: SOB  HPI: Virginia Wilson is a 67 y.o. female with medical history significant of allergic rhinitis, asthma (listed in her medical problem list, but pt is not aware of this diagnosis), HTN, HLD, hypothyroidism, gout, GERD, CKD stage IIIa, migraine headache, who presents with shortness of breath.  Patient states that her SOB started suddenly when she was eating dinner at a restaurant.  She has a dry cough and chest tightness.  She states that her shortness of breath is severe. She states that she does not have any sensation of something stuck in her throat.  She has not had any itching or rash with the onset of the symptoms.  No fever or chills.  She does not have nausea, vomiting, diarrhea or abdominal pain.  No symptoms of UTI.  Per ED physician, initially patient had severe respiratory distress with wheezing on auscultation.  Patient has oxygen desaturation to upper 80s on room air, and required 4L of oxygen. Patient is not using oxygen normally.  Respirations is up to 40s.  Patient was given 125 mg of Solu-Medrol, 2 g of magnesium sulfate and bronchodilators nebulizer in ED.  Her respiratory distress has improved when I saw patient in ED.  Currently patient has oxygen saturation 90-96% on room air.  Respiration rate is 18.  Data reviewed independently and ED Course: pt was found to have WBC 9.2, stable renal function, BNP 102, temperature normal, heart rate 115.  VBG with pH 7.35, CO2 48, O2 54.  Patient is placed on PCU as inpatient  Chest x-ray: 1. Very large hiatal hernia extending into the left hemithorax, probably containing most of the stomach. Associated passive atelectasis.  CTA: 1. No pulmonary embolus. 2. Dilated main pulmonary artery, suggestive of pulmonary  arterial hypertension. 3. Large hiatal hernia with more than 50% of the stomach being intrathoracic. 4. Moderate bronchial thickening, can be seen with bronchitis or reactive airways disease.   Aortic Atherosclerosis (ICD10-I70.0).    EKG: I have personally reviewed.  Sinus rhythm, QTc 436, LAD, low voltage.   Review of Systems:   General: no fevers, chills, no body weight gain,  fatigue HEENT: no blurry vision, hearing changes or sore throat Respiratory: has dyspnea, coughing, wheezing CV: Has chest tightness, no palpitations GI: no nausea, vomiting, abdominal pain, diarrhea, constipation GU: no dysuria, burning on urination, increased urinary frequency, hematuria  Ext: no leg edema Neuro: no unilateral weakness, numbness, or tingling, no vision change or hearing loss Skin: no rash, no skin tear. MSK: No muscle spasm, no deformity, no limitation of range of movement in spin Heme: No easy bruising.  Travel history: No recent long distant travel.   Allergy:  Allergies  Allergen Reactions   Sulfonamide Derivatives     REACTION: unspecified   Penicillins     Has patient had a PCN reaction causing immediate rash, facial/tongue/throat swelling, SOB or lightheadedness with hypotension: Unknown Has patient had a PCN reaction causing severe rash involving mucus membranes or skin necrosis: Unknown Has patient had a PCN reaction that required hospitalization: no Has patient had a PCN reaction occurring within the last 10 years: No If all of the above answers are "NO", then may proceed with Cephalosporin use.     Past Medical History:  Diagnosis  Date   Allergic rhinitis, cause unspecified    Anemia    Basal cell carcinoma 11/30/2020   L nasal tip, MOHs 01/07/21   Chronic headaches    Chronic kidney disease, unspecified    per pt, no kidney disease   Esophageal reflux    Extrinsic asthma, unspecified    per pt, does not have   Family history of ischemic heart disease     Obesity, unspecified    Premenstrual tension syndromes    Pure hypercholesterolemia    SOB (shortness of breath) on exertion    Symptomatic menopausal or female climacteric states    Unspecified essential hypertension    Unspecified hypothyroidism     Past Surgical History:  Procedure Laterality Date   CARDIOVASCULAR STRESS TEST     done on 06/19/17/was normal.   ovarian repair Left    TONSILLECTOMY      Social History:  reports that she has never smoked. She has never used smokeless tobacco. She reports that she does not drink alcohol and does not use drugs.  Family History:  Family History  Problem Relation Age of Onset   Hypertension Mother    Diabetes Mother    Stroke Mother    Diabetes Father    Coronary artery disease Father    Heart attack Father    Hypertension Brother    Hypertension Brother    Breast cancer Neg Hx      Prior to Admission medications   Medication Sig Start Date End Date Taking? Authorizing Provider  allopurinol (ZYLOPRIM) 300 MG tablet TAKE 1 TABLET BY MOUTH DAILY 03/20/23   Bedsole, Amy E, MD  atorvastatin (LIPITOR) 40 MG tablet TAKE 1 TABLET BY MOUTH DAILY 04/01/23   Bedsole, Amy E, MD  betamethasone dipropionate 0.05 % cream Apply topically 2 (two) times daily. 12/09/20   Bedsole, Amy E, MD  clobetasol (TEMOVATE) 0.05 % external solution Patient to mix solution in jar of CeraVe Cream. Apply all over neck and back twice daily until itchy rash improved. Avoid face, groin, underarms. 03/28/21   Willeen Niece, MD  EPINEPHrine 0.3 mg/0.3 mL IJ SOAJ injection  04/25/17   [provider]  ferrous sulfate 325 (65 FE) MG tablet Take 325 mg by mouth daily.     [provider]  fluocinonide (LIDEX) 0.05 % external solution Apply 1 application topically daily as needed. 09/19/19   Bedsole, Amy E, MD  hydrochlorothiazide (HYDRODIURIL) 25 MG tablet TAKE 1 TABLET BY MOUTH DAILY 03/20/23   Bedsole, Amy E, MD  ketoconazole (NIZORAL) 2 % shampoo Apply  to scalp and behind ears daily as needed for flares, left sit several minutes before rinsing. 04/12/21   Willeen Niece, MD  levothyroxine (SYNTHROID) 50 MCG tablet Take 1 tablet (50 mcg total) by mouth daily before breakfast. 05/31/23   Bedsole, Amy E, MD  nitroGLYCERIN (NITROSTAT) 0.4 MG SL tablet Place 1 tablet (0.4 mg total) under the tongue every 5 (five) minutes as needed for chest pain. 06/13/17 09/11/17  Georgeanna Lea, MD  Omega-3 Fatty Acids (FISH OIL) 1200 MG CAPS Take 1 capsule by mouth daily.    [provider]  omeprazole (PRILOSEC) 40 MG capsule Take 40 mg by mouth daily.    [provider]  pimecrolimus (ELIDEL) 1 % cream Apply topically 2 (two) times daily. Apply 1-2 times a day to eyebrows and around ears as needed for rash. 11/30/20   Willeen Niece, MD    Physical Exam: Vitals:   06/02/23  1915 06/02/23 1930 06/02/23 1945 06/02/23 2000  BP: (!) 158/70 (!) 154/83 (!) 153/80 (!) 151/81  Pulse: 90 88 85 84  Resp: 19 19 17 17   Temp:      TempSrc:      SpO2: 96% 96% 94% 97%  Height:       General: Not in acute distress HEENT:       Eyes: PERRL, EOMI, no jaundice       ENT: No discharge from the ears and nose, no pharynx injection, no tonsillar enlargement.        Neck: No JVD, no bruit, no mass felt. Heme: No neck lymph node enlargement. Cardiac: S1/S2, RRR, No murmurs, No gallops or rubs. Respiratory: Has decreased air movement bilaterally.  Wheezing has resolved.Marland Kitchen GI: Soft, nondistended, nontender, no rebound pain, no organomegaly, BS present. GU: No hematuria Ext: No pitting leg edema bilaterally. 1+DP/PT pulse bilaterally. Musculoskeletal: No joint deformities, No joint redness or warmth, no limitation of ROM in spin. Skin: No rashes.  Neuro: Alert, oriented X3, cranial nerves II-XII grossly intact, moves all extremities normally. Psych: Patient is not psychotic, no suicidal or hemocidal ideation.  Labs on Admission: I have personally reviewed  following labs and imaging studies  CBC: Recent Labs  Lab 06/02/23 1625  WBC 9.2  HGB 14.2  HCT 43.8  MCV 96.1  PLT 314   Basic Metabolic Panel: Recent Labs  Lab 06/02/23 1625  NA 137  K 4.0  CL 105  CO2 24  GLUCOSE 140*  BUN 24*  CREATININE 1.27*  CALCIUM 8.9   GFR: CrCl cannot be calculated (Unknown ideal weight.). Liver Function Tests: Recent Labs  Lab 06/02/23 1625  AST 25  ALT 19  ALKPHOS 67  BILITOT 0.8  PROT 7.2  ALBUMIN 4.1   No results for input(s): "LIPASE", "AMYLASE" in the last 168 hours. No results for input(s): "AMMONIA" in the last 168 hours. Coagulation Profile: No results for input(s): "INR", "PROTIME" in the last 168 hours. Cardiac Enzymes: No results for input(s): "CKTOTAL", "CKMB", "CKMBINDEX", "TROPONINI" in the last 168 hours. BNP (last 3 results) No results for input(s): "PROBNP" in the last 8760 hours. HbA1C: No results for input(s): "HGBA1C" in the last 72 hours. CBG: No results for input(s): "GLUCAP" in the last 168 hours. Lipid Profile: No results for input(s): "CHOL", "HDL", "LDLCALC", "TRIG", "CHOLHDL", "LDLDIRECT" in the last 72 hours. Thyroid Function Tests: No results for input(s): "TSH", "T4TOTAL", "FREET4", "T3FREE", "THYROIDAB" in the last 72 hours. Anemia Panel: No results for input(s): "VITAMINB12", "FOLATE", "FERRITIN", "TIBC", "IRON", "RETICCTPCT" in the last 72 hours. Urine analysis: No results found for: "COLORURINE", "APPEARANCEUR", "LABSPEC", "PHURINE", "GLUCOSEU", "HGBUR", "BILIRUBINUR", "KETONESUR", "PROTEINUR", "UROBILINOGEN", "NITRITE", "LEUKOCYTESUR" Sepsis Labs: @LABRCNTIP (procalcitonin:4,lacticidven:4) ) Recent Results (from the past 240 hour(s))  SARS Coronavirus 2 by RT PCR (hospital order, performed in [facility] hospital lab) *cepheid single result test* Anterior Nasal Swab     Status: None   Collection Time: 06/02/23  6:32 PM   Specimen: Anterior Nasal Swab  Result Value Ref Range Status   SARS  Coronavirus 2 by RT PCR NEGATIVE NEGATIVE Final    Comment: (NOTE) SARS-CoV-2 target nucleic acids are NOT DETECTED.  The SARS-CoV-2 RNA is generally detectable in upper and lower respiratory specimens during the acute phase of infection. The lowest concentration of SARS-CoV-2 viral copies this assay can detect is 250 copies / mL. A negative result does not preclude SARS-CoV-2 infection and should not be used as the sole basis for treatment or  other patient management decisions.  A negative result may occur with improper specimen collection / handling, submission of specimen other than nasopharyngeal swab, presence of viral mutation(s) within the areas targeted by this assay, and inadequate number of viral copies (<250 copies / mL). A negative result must be combined with clinical observations, patient history, and epidemiological information.  Fact Sheet for Patients:   RoadLapTop.co.za  Fact Sheet for Healthcare Providers: http://kim-miller.com/  This test is not yet approved or  cleared by the [place] FDA and has been authorized for detection and/or diagnosis of SARS-CoV-2 by FDA under an Emergency Use Authorization (EUA).  This EUA will remain in effect (meaning this test can be used) for the duration of the COVID-19 declaration under Section 564(b)(1) of the Act, 21 U.S.C. section 360bbb-3(b)(1), unless the authorization is terminated or revoked sooner.  Performed at [facility], [address] [place], [place] [address]      Radiological Exams on Admission: CT Angio Chest PE W/Cm &/Or Wo Cm  Result Date: 06/02/2023 CLINICAL DATA:  Pulmonary embolism (PE) suspected, high prob Shortness of breath. EXAM: CT ANGIOGRAPHY CHEST WITH CONTRAST TECHNIQUE: Multidetector CT imaging of the chest was performed using the standard protocol during bolus administration of intravenous contrast. Multiplanar CT image  reconstructions and MIPs were obtained to evaluate the vascular anatomy. RADIATION DOSE REDUCTION: This exam was performed according to the departmental dose-optimization program which includes automated exposure control, adjustment of the mA and/or kV according to patient size and/or use of iterative reconstruction technique. CONTRAST:  75mL OMNIPAQUE IOHEXOL 350 MG/ML SOLN COMPARISON:  Chest radiograph earlier today FINDINGS: Cardiovascular: There are no filling defects within the pulmonary arteries to suggest pulmonary embolus. Dilated main pulmonary artery at 3.5 cm. The heart is normal in size. There is no pericardial effusion. Aortic atherosclerosis and tortuosity. No acute aortic findings. Mediastinum/Nodes: No mediastinal adenopathy. Small left hilar lymph nodes, not enlarged by size criteria. Large hiatal hernia with greater than 50% tent of the stomach being intrathoracic. The entire esophagus is patulous. No thyroid nodule. Lungs/Pleura: Compressive atelectasis in the left lung adjacent to a large hiatal hernia. Moderate bronchial thickening no findings of pneumonia or suspicious airspace disease. No pleural fluid. No pulmonary mass. Upper Abdomen: Large hiatal hernia with more than 50% of the stomach being intrathoracic. Ingested material is within the stomach both above and below the diaphragm. No acute upper abdominal findings. Musculoskeletal: Scattered Schmorl's nodes in the thoracic spine. There are no acute or suspicious osseous abnormalities. Review of the MIP images confirms the above findings. IMPRESSION: 1. No pulmonary embolus. 2. Dilated main pulmonary artery, suggestive of pulmonary arterial hypertension. 3. Large hiatal hernia with more than 50% of the stomach being intrathoracic. 4. Moderate bronchial thickening, can be seen with bronchitis or reactive airways disease. Aortic Atherosclerosis (ICD10-I70.0). Electronically Signed   By: Narda Rutherford M.D.   On: 06/02/2023 17:43   DG  Chest Portable 1 View  Result Date: 06/02/2023 CLINICAL DATA:  Sudden onset respiratory distress and difficulty swallowing. Shortness of breath. EXAM: PORTABLE CHEST 1 VIEW COMPARISON:  None Available. FINDINGS: Very large hiatal hernia extending into the left hemithorax. This probably contains most of the stomach. Associated passive atelectasis. Cardiac and mediastinal margins appear normal. The lungs appear otherwise clear. No blunting of the costophrenic angles. No significant bony abnormality is identified. IMPRESSION: 1. Very large hiatal hernia extending into the left hemithorax, probably containing most of the stomach. Associated passive atelectasis. Electronically Signed   By: Zollie Beckers  Ova Freshwater M.D.   On: 06/02/2023 17:01      Assessment/Plan Principal Problem:   Asthma exacerbation Active Problems:   HYPERCHOLESTEROLEMIA   Hypothyroidism   Essential hypertension, benign   Gout with manifestations   Chronic kidney disease, stage 3a (HCC)   GERD (gastroesophageal reflux disease)   Hiatal hernia   Assessment and Plan:  Possible asthma exacerbation: pt has diagnosis of asthma listed in her medical problem list, but she is not aware of this diagnosis. Per EDP, pt had wheezing initially with severe respiratory distress.  Patient had 4L of new oxygen requirement initially.  Her respiratory distress has improved quickly after giving 125 mg of Solu-Medrol, bronchodilators nebulizer and 2 g of magnesium sulfate.  Currently patient does not have respiratory distress, with RR 18 and oxygen saturation 90-96% on room air.  CTA negative for PE, but showed large hiatal hernia which may have contributed partially to her shortness of breath.  Patient does not have any rashes, low suspicions for anaphylaxis reaction.  - will place in PCU observation -Bronchodilators -Patient received 2 g of magnesium sulfate in ED -Solu-Medrol 40 mg IV bid after given 125 mg -Mucinex for cough  -Nasal cannula  oxygen as needed to maintain O2 saturation 93% or greater -check Covid 19 PCR  HYPERCHOLESTEROLEMIA -Lipitor  Hypothyroidism -Synthroid  Essential hypertension, benign -hydrochlorothiazide -IV hydralazine as needed  Gout with manifestations -Allopurinol  Chronic kidney disease, stage 3a (HCC): Stable renal function.  Recent baseline creatinine 1.27 on 01/25/2023.  Her creatinine is 1.27, BUN 24, GFR 47 -Follow-up with BMP  GERD: -Protonix  Hiatal hernia: Patient has large hiatal hernia.  No chest pain. -Patient is on Protonix -Recommended patient to follow-up with PCP for a referral to surgeon as outpt. She agreed with this plan.     DVT ppx: SQ Lovenox  Code Status: Full code    Family Communication: Yes, patient's daughter    at bed side.    Disposition Plan:  Anticipate discharge back to previous environment  Consults called: None  Admission status and Level of care: Progressive:   for obs  Dispo: The patient is from: Home              Anticipated d/c is to: Home              Anticipated d/c date is: 1 day              Patient currently is not medically stable to d/c.    Severity of Illness:  The appropriate patient status for this patient is OBSERVATION. Observation status is judged to be reasonable and necessary in order to provide the required intensity of service to ensure the patient's safety. The patient's presenting symptoms, physical exam findings, and initial radiographic and laboratory data in the context of their medical condition is felt to place them at decreased risk for further clinical deterioration. Furthermore, it is anticipated that the patient will be medically stable for discharge from the hospital within 2 midnights of admission.        Date of Service 06/02/2023    Lorretta Harp [facility]   If 7PM-7AM, please contact night-coverage [email or web address] 06/02/2023, 8:06 PM

## 2023-06-02 NOTE — ED Triage Notes (Signed)
Pt to ed from a restaurant where she became SOB all of a sudden and felt like she couldn't clear her throat. Pt is able to swallow. Pt is very SOB in triage.

## 2023-06-02 NOTE — ED Notes (Signed)
Pt resting now on room air , lungs clear , no complaints

## 2023-06-02 NOTE — ED Provider Notes (Signed)
[facility] Provider Note    Event Date/Time   First MD Initiated Contact with Patient 06/02/23 1616     (approximate)   History   Chief Complaint Shortness of Breath   HPI  Virginia Wilson is a 67 y.o. female with past medical history of hypertension, hyperlipidemia, migraines, and CKD who presents to the ED complaining of shortness of breath.  Patient reports that she was eating at a restaurant prior to arrival when she suddenly began to cough and feel like she was developing tightness in her chest with significant difficulty breathing.  She states that this did not start after eating something and she does not have any sensation of something stuck in her throat.  She states that she coughs "all of the time", but denies any history of COPD or asthma.  She has not had any itching or rash with the onset of the symptoms, has not had any vomiting or diarrhea.  She states that her chest feels very tight and painful and like she cannot get enough air in.     Physical Exam   Triage Vital Signs: ED Triage Vitals  Encounter Vitals Group     BP 06/02/23 1610 (!) 187/156     Systolic BP Percentile --      Diastolic BP Percentile --      Pulse Rate 06/02/23 1610 (!) 115     Resp 06/02/23 1610 (!) 40     Temp 06/02/23 1610 98 F (36.7 C)     Temp Source 06/02/23 1610 Oral     SpO2 06/02/23 1610 90 %     Weight --      Height 06/02/23 1611 5\' 6"  (1.676 m)     Head Circumference --      Peak Flow --      Pain Score 06/02/23 1611 0     Pain Loc --      Pain Education --      Exclude from Growth Chart --     Most recent vital signs: Vitals:   06/02/23 1610 06/02/23 1630  BP: (!) 187/156 (!) 162/101  Pulse: (!) 115 (!) 106  Resp: (!) 40 (!) 31  Temp: 98 F (36.7 C)   SpO2: 90% 96%    Constitutional: Alert and oriented. Eyes: Conjunctivae are normal. Head: Atraumatic. Nose: No congestion/rhinnorhea. Mouth/Throat: Mucous membranes are moist.   Cardiovascular: Tachycardic, regular rhythm. Grossly normal heart sounds.  2+ radial pulses bilaterally. Respiratory: Tachypneic with increased respiratory effort, inspiratory and expiratory wheezing noted throughout. Gastrointestinal: Soft and nontender. No distention. Musculoskeletal: No lower extremity tenderness nor edema.  Neurologic:  Normal speech and language. No gross focal neurologic deficits are appreciated.    ED Results / Procedures / Treatments   Labs (all labs ordered are listed, but only abnormal results are displayed) Labs Reviewed  COMPREHENSIVE METABOLIC PANEL - Abnormal; Notable for the following components:      Result Value   Glucose, Bld 140 (*)    BUN 24 (*)    Creatinine, Ser 1.27 (*)    GFR, Estimated 47 (*)    All other components within normal limits  BLOOD GAS, VENOUS - Abnormal; Notable for the following components:   pO2, Ven 54 (*)    All other components within normal limits  BRAIN NATRIURETIC PEPTIDE - Abnormal; Notable for the following components:   B Natriuretic Peptide 102.2 (*)    All other components within normal limits  CBC  TROPONIN  I (HIGH SENSITIVITY)  TROPONIN I (HIGH SENSITIVITY)     EKG  ED ECG REPORT I, Chesley Noon, the attending physician, personally viewed and interpreted this ECG.   Date: 06/02/2023  EKG Time: 16:11  Rate: 105  Rhythm: sinus tachycardia  Axis: Normal  Intervals:none  ST&T Change: None  RADIOLOGY Chest x-ray reviewed and interpreted by me with large hiatal hernia, no focal infiltrate or edema noted.  PROCEDURES:  Critical Care performed: Yes, see critical care procedure note(s)  .Critical Care  Performed by: Chesley Noon, MD Authorized by: Chesley Noon, MD   Critical care provider statement:    Critical care time (minutes):  30   Critical care time was exclusive of:  Separately billable procedures and treating other patients and teaching time   Critical care was necessary to treat  or prevent imminent or life-threatening deterioration of the following conditions:  Respiratory failure   Critical care was time spent personally by me on the following activities:  Development of treatment plan with patient or surrogate, discussions with consultants, evaluation of patient's response to treatment, examination of patient, ordering and review of laboratory studies, ordering and review of radiographic studies, ordering and performing treatments and interventions, pulse oximetry, re-evaluation of patient's condition and review of old charts   I assumed direction of critical care for this patient from another provider in my specialty: no     Care discussed with: admitting provider      MEDICATIONS ORDERED IN ED: Medications  albuterol (PROVENTIL) (2.5 MG/3ML) 0.083% nebulizer solution 2.5 mg (has no administration in time range)  ipratropium-albuterol (DUONEB) 0.5-2.5 (3) MG/3ML nebulizer solution 9 mL (9 mLs Nebulization Given 06/02/23 1626)  methylPREDNISolone sodium succinate (SOLU-MEDROL) 125 mg/2 mL injection 125 mg (125 mg Intravenous Given 06/02/23 1625)  magnesium sulfate IVPB 2 g 50 mL (0 g Intravenous Stopped 06/02/23 1737)  iohexol (OMNIPAQUE) 350 MG/ML injection 75 mL (75 mLs Intravenous Contrast Given 06/02/23 1708)     IMPRESSION / MDM / ASSESSMENT AND PLAN / ED COURSE  I reviewed the triage vital signs and the nursing notes.                              67 y.o. female with past medical history of hypertension, hyperlipidemia, migraines, and CKD who presents to the ED with acute onset difficulty breathing and chest tightness while eating at a restaurant.  Patient's presentation is most consistent with acute presentation with potential threat to life or bodily function.  Differential diagnosis includes, but is not limited to, bronchospasm, pneumonia, pneumothorax, ACS, PE, anaphylaxis, tracheal obstruction.  Patient ill-appearing and in moderate respiratory distress,  noted to be hypoxic on room air but improved on 4 L nasal cannula to 97%.  She seems to have wheezing throughout with poor air movement, although does not have any reported history of COPD or asthma.  We will treat with IV Solu-Medrol and give DuoNebs, no additional symptoms to suggest anaphylactic reaction and patient denies onset of symptoms while eating, no reported sensation of choking.  EKG shows sinus tachycardia with no ischemic changes, labs and chest x-ray are pending at this time.  Wheezing significantly improved on reassessment and patient was able to be weaned off of supplemental oxygen.  Chest x-ray shows large hiatal hernia but is otherwise unremarkable, CT imaging negative for PE but redemonstrates large hiatal hernia.  This may be contributing to her symptoms and patient advised to follow-up as an  outpatient for possible surgical intervention, although no emergent surgical intervention indicated at this time.  Labs are reassuring with no significant anemia, leukocytosis, tract abnormality, or AKI.  Troponin within normal limits and VBG without significant hypercapnia.  Case discussed with hospitalist for admission.      FINAL CLINICAL IMPRESSION(S) / ED DIAGNOSES   Final diagnoses:  Bronchospasm  Acute respiratory failure with hypoxia (HCC)  Hiatal hernia     Rx / DC Orders   ED Discharge Orders     None        Note:  This document was prepared using Dragon voice recognition software and may include unintentional dictation errors.   Chesley Noon, MD 06/02/23 [phone]

## 2023-06-03 DIAGNOSIS — M109 Gout, unspecified: Secondary | ICD-10-CM

## 2023-06-03 DIAGNOSIS — J4521 Mild intermittent asthma with (acute) exacerbation: Secondary | ICD-10-CM

## 2023-06-03 DIAGNOSIS — K449 Diaphragmatic hernia without obstruction or gangrene: Secondary | ICD-10-CM | POA: Diagnosis not present

## 2023-06-03 DIAGNOSIS — N1831 Chronic kidney disease, stage 3a: Secondary | ICD-10-CM

## 2023-06-03 DIAGNOSIS — E7800 Pure hypercholesterolemia, unspecified: Secondary | ICD-10-CM | POA: Diagnosis not present

## 2023-06-03 DIAGNOSIS — E038 Other specified hypothyroidism: Secondary | ICD-10-CM

## 2023-06-03 DIAGNOSIS — K219 Gastro-esophageal reflux disease without esophagitis: Secondary | ICD-10-CM

## 2023-06-03 DIAGNOSIS — I10 Essential (primary) hypertension: Secondary | ICD-10-CM | POA: Diagnosis not present

## 2023-06-03 LAB — BASIC METABOLIC PANEL
Anion gap: 13 (ref 5–15)
BUN: 23 mg/dL (ref 8–23)
CO2: 21 mmol/L — ABNORMAL LOW (ref 22–32)
Calcium: 9 mg/dL (ref 8.9–10.3)
Chloride: 102 mmol/L (ref 98–111)
Creatinine, Ser: 1.2 mg/dL — ABNORMAL HIGH (ref 0.44–1.00)
GFR, Estimated: 50 mL/min — ABNORMAL LOW (ref >60)
Glucose, Bld: 161 mg/dL — ABNORMAL HIGH (ref 70–99)
Potassium: 3.7 mmol/L (ref 3.5–5.1)
Sodium: 136 mmol/L (ref 135–145)

## 2023-06-03 LAB — CBC
HCT: 41.5 % (ref 36.0–46.0)
Hemoglobin: 13.4 g/dL (ref 12.0–15.0)
MCH: 30.5 pg (ref 26.0–34.0)
MCHC: 32.3 g/dL (ref 30.0–36.0)
MCV: 94.3 fL (ref 80.0–100.0)
Platelets: 303 10*3/uL (ref 150–400)
RBC: 4.4 MIL/uL (ref 3.87–5.11)
RDW: 14.7 % (ref 11.5–15.5)
WBC: 9.8 10*3/uL (ref 4.0–10.5)
nRBC: 0 % (ref 0.0–0.2)

## 2023-06-03 LAB — HIV ANTIBODY (ROUTINE TESTING W REFLEX): HIV Screen 4th Generation wRfx: NONREACTIVE

## 2023-06-03 MED ORDER — ALBUTEROL SULFATE HFA 108 (90 BASE) MCG/ACT IN AERS: 2.0000 | INHALATION_SPRAY | Freq: Four times a day (QID) | RESPIRATORY_TRACT | 2 refills | Status: DC | PRN

## 2023-06-03 MED ORDER — PANTOPRAZOLE SODIUM 40 MG PO TBEC: 40.0000 mg | DELAYED_RELEASE_TABLET | Freq: Two times a day (BID) | ORAL | 0 refills | Status: DC

## 2023-06-03 NOTE — ED Notes (Signed)
Ambulated patient down the hallway to observe oxygen saturation with ambulation. Patient walked with a smooth steady gait. Oxygen saturation varied between 90-93 when ambulating. Patient was at 90% on room air when returning to the patient's room.  Resp labored but even upon arrival back to room, as well.

## 2023-06-03 NOTE — Hospital Course (Signed)
Virginia Wilson is a 67 y.o. female w/ PMH HTN, HLD, migraines, CKD3a. Presented to ED c/o SOB. Patient reports that she was eating at a restaurant prior to arrival when she suddenly began to cough and feel like she was developing tightness in her chest with significant difficulty breathing. She states that this did not start after eating something and she does not have any sensation of something stuck in her throat. She states that she coughs "all of the time", but denies any history of COPD or asthma.  08/24: to ED. BP 187/156 in triage, HR 115, Resp 40 = (+)SIRS. Cr 1.27, VBG pO2 54, BNP 102, EKG no concerns. CXR (+)Very large hiatal hernia extending into the left hemithorax,probably containing most of the stomach, associated passive atelectasis. CTA no PE, dilated pulm A possible pulm A HTN, large hiatal hernia >50% stomach is intrathoracic, moderate bronchial thickening c/w bronchitis or reactive airway disease. Respiratory distress has improved quickly after giving 125 mg of Solu-Medrol, bronchodilators nebulizer and 2 g of magnesium sulfate. Neg COVID.  08/25: BP this AM 145/80. SpO2 WNL on RA. Ambulation w/ O2 ***.    Consultants:  ***  Procedures: ***      ASSESSMENT & PLAN:   Principal Problem:   Asthma exacerbation Active Problems:   HYPERCHOLESTEROLEMIA   Hypothyroidism   Essential hypertension, benign   Gout with manifestations   Chronic kidney disease, stage 3a (HCC)   GERD (gastroesophageal reflux disease)   Hiatal hernia   Possible asthma exacerbation:  diagnosis of asthma listed in her medical problem list, but she is not aware of this diagnosis.  low suspicions for anaphylaxis reaction Large hiatal hernia may also contribute to SOB Observation Telemetry Bronchodilators Steroids  Mucinex Supplemental O2 prn    HYPERCHOLESTEROLEMIA Lipitor   Hypothyroidism Synthroid   Essential hypertension, benign Home hydrochlorothiazide IV hydralazine as needed   Gout  with manifestations Allopurinol   Chronic kidney disease, stage 3a (HCC):  Stable renal function.   Recent baseline creatinine 1.27 on 01/25/2023.  BMP outpatient    GERD:  Hiatal hernia: Large hiatal hernia.  No chest pain. Protonix follow-up with PCP for a referral to surgeon as outpt. She agreed with this plan.        DVT prophylaxis: *** Pertinent IV fluids/nutrition: *** Central lines / invasive devices: ***  Code Status: *** ACP documentation reviewed: ***  Current Admission Status: ***  TOC needs / Dispo plan: *** Barriers to discharge / significant pending items: ***

## 2023-06-03 NOTE — ED Notes (Signed)
Patient to ambulate with smooth steady gait to the restroom.

## 2023-06-03 NOTE — Discharge Summary (Signed)
Physician Discharge Summary   Patient: Virginia Wilson MRN: [id number]  DOB: August 30, 1956   Admit:     Date of Admission: 06/02/2023 Admitted from: home   Discharge: Date of discharge: 06/03/23 Disposition: Home Condition at discharge: good  CODE STATUS: FULL CODE     Discharge Physician: Virginia Nielsen, DO [facility]     PCP: Virginia Seltzer, Wilson  Recommendations for Outpatient Follow-up:  Follow up with PCP Virginia Wilson, Virginia E, Wilson in 1-2 weeks Please obtain labs/tests: referral placed here for [facility] general surgery please confirm it is in process for hiatal hernia repair Consider LFT< question reactive airway disease    Discharge Instructions     Ambulatory referral to General Surgery   Complete by: As directed    What is the reason for referral?:  Comment - HIATAL HERNIA, LARGE   Diet - low sodium heart healthy   Complete by: As directed    Increase activity slowly   Complete by: As directed          Discharge Diagnoses: Principal Problem:   Asthma exacerbation Active Problems:   HYPERCHOLESTEROLEMIA   Hypothyroidism   Essential hypertension, benign   Gout with manifestations   Chronic kidney disease, stage 3a (HCC)   GERD (gastroesophageal reflux disease)   Hiatal hernia       Hospital Course: [name] is a 67 y.o. female w/ PMH HTN, HLD, migraines, CKD3a. Presented to ED c/o SOB. Patient reports that she was eating at a restaurant prior to arrival when she suddenly began to cough and feel like she was developing tightness in her chest with significant difficulty breathing. She states that this did not start after eating something and she does not have any sensation of something stuck in her throat. She states that she coughs "all of the time", but denies any history of COPD or asthma.  08/24: to ED. BP 187/156 in triage, HR 115, Resp 40 = (+)SIRS. Cr 1.27, VBG pO2 54, BNP 102, EKG no concerns. CXR (+)Very large hiatal hernia extending into the  left hemithorax,probably containing most of the stomach, associated passive atelectasis. CTA no PE, dilated pulm A possible pulm A HTN, large hiatal hernia >50% stomach is intrathoracic, moderate bronchial thickening c/w bronchitis or reactive airway disease. Respiratory distress has improved quickly after giving 125 mg of Solu-Medrol, bronchodilators nebulizer and 2 g of magnesium sulfate. Neg COVID.  08/25: BP this AM 145/80. SpO2 WNL on RA. Ambulation w/ O2 no concerns. Pt states feeling well. Referral palced for general surgery at [facility], pt prefers not a [employer] .    Consultants:  none  Procedures: none      ASSESSMENT & PLAN:    Possible asthma exacerbation:  diagnosis of asthma listed in her medical problem list, but she is not aware of this diagnosis.  low suspicions for anaphylaxis reaction Large hiatal hernia may also contribute to SOB Albuterol prn No wheezing on exam  Follow w/ PCP consider LFT GERD may also contribute to irritation, PPI as below    GERD:  Hiatal hernia: Large hiatal hernia.  No chest pain. Protonix po bid  follow-up with referral to surgeon as outpt. She agreed with this plan.  HYPERCHOLESTEROLEMIA Lipitor   Hypothyroidism Synthroid   Essential hypertension, benign Home hydrochlorothiazide IV hydralazine as needed   Gout with manifestations Allopurinol   Chronic kidney disease, stage 3a (HCC):  Stable renal function.   Recent baseline creatinine 1.27 on 01/25/2023.  BMP  outpatient                 Discharge Instructions  Allergies as of 06/03/2023       Reactions   Sulfonamide Derivatives    REACTION: unspecified   Penicillins    Has patient had a PCN reaction causing immediate rash, facial/tongue/throat swelling, SOB or lightheadedness with hypotension: Unknown Has patient had a PCN reaction causing severe rash involving mucus membranes or skin necrosis: Unknown Has patient had a PCN reaction that required  hospitalization: no Has patient had a PCN reaction occurring within the last 10 years: No If all of the above answers are "NO", then may proceed with Cephalosporin use.        Medication List     STOP taking these medications    omeprazole 40 MG capsule Commonly known as: PRILOSEC Replaced by: pantoprazole 40 MG tablet       TAKE these medications    albuterol 108 (90 Base) MCG/ACT inhaler Commonly known as: VENTOLIN HFA Inhale 2 puffs into the lungs every 6 (six) hours as needed for wheezing or shortness of breath.   allopurinol 300 MG tablet Commonly known as: ZYLOPRIM TAKE 1 TABLET BY MOUTH DAILY   atorvastatin 40 MG tablet Commonly known as: LIPITOR TAKE 1 TABLET BY MOUTH DAILY   betamethasone dipropionate 0.05 % cream Apply topically 2 (two) times daily.   clobetasol 0.05 % external solution Commonly known as: TEMOVATE Patient to mix solution in jar of CeraVe Cream. Apply all over neck and back twice daily until itchy rash improved. Avoid face, groin, underarms.   EPINEPHrine 0.3 mg/0.3 mL Soaj injection Commonly known as: EPI-PEN   ferrous sulfate 325 (65 FE) MG tablet Take 325 mg by mouth daily.   Fish Oil 1200 MG Caps Take 1 capsule by mouth daily.   fluocinonide 0.05 % external solution Commonly known as: LIDEX Apply 1 application topically daily as needed.   hydrochlorothiazide 25 MG tablet Commonly known as: HYDRODIURIL TAKE 1 TABLET BY MOUTH DAILY   ketoconazole 2 % shampoo Commonly known as: NIZORAL Apply to scalp and behind ears daily as needed for flares, left sit several minutes before rinsing.   levothyroxine 50 MCG tablet Commonly known as: SYNTHROID Take 1 tablet (50 mcg total) by mouth daily before breakfast.   nitroGLYCERIN 0.4 MG SL tablet Commonly known as: NITROSTAT Place 1 tablet (0.4 mg total) under the tongue every 5 (five) minutes as needed for chest pain.   pantoprazole 40 MG tablet Commonly known as: PROTONIX Take 1  tablet (40 mg total) by mouth 2 (two) times daily before a meal. Replaces: omeprazole 40 MG capsule   pimecrolimus 1 % cream Commonly known as: Elidel Apply topically 2 (two) times daily. Apply 1-2 times a day to eyebrows and around ears as needed for rash.         Follow-up Information     [facility], [facility]. Call.   Specialty: General Surgery Why: REFERRAL SENT PLEASE ALSO CALL TO CONFIRM APPOINTMENT Contact information: [address] [place] [address] [phone]                 Allergies  Allergen Reactions   Sulfonamide Derivatives     REACTION: unspecified   Penicillins     Has patient had a PCN reaction causing immediate rash, facial/tongue/throat swelling, SOB or lightheadedness with hypotension: Unknown Has patient had a PCN reaction causing severe rash involving mucus membranes or skin  necrosis: Unknown Has patient had a PCN reaction that required hospitalization: no Has patient had a PCN reaction occurring within the last 10 years: No If all of the above answers are "NO", then may proceed with Cephalosporin use.      Subjective: pt feelng wel lthis morning, no SOB, ambulating independently, no chest pain   Discharge Exam: BP 137/81   Pulse 93   Temp 98 F (36.7 C) (Oral)   Resp (!) 22   Ht 5\' 6"  (1.676 m)   SpO2 93%   BMI 31.15 kg/m  General: Pt is alert, awake, not in acute distress Cardiovascular: RRR, S1/S2 +, no rubs, no gallops Respiratory: CTA bilaterally, no wheezing, no rhonchi Abdominal: Soft, NT, ND, bowel sounds + Extremities: no edema, no cyanosis     The results of significant diagnostics from this hospitalization (including imaging, microbiology, ancillary and laboratory) are listed below for reference.     Microbiology: Recent Results (from the past 240 hour(s))  SARS Coronavirus 2 by RT PCR (hospital order, performed in [facility] hospital lab) *cepheid single result test* Anterior  Nasal Swab     Status: None   Collection Time: 06/02/23  6:32 PM   Specimen: Anterior Nasal Swab  Result Value Ref Range Status   SARS Coronavirus 2 by RT PCR NEGATIVE NEGATIVE Final    Comment: (NOTE) SARS-CoV-2 target nucleic acids are NOT DETECTED.  The SARS-CoV-2 RNA is generally detectable in upper and lower respiratory specimens during the acute phase of infection. The lowest concentration of SARS-CoV-2 viral copies this assay can detect is 250 copies / mL. A negative result does not preclude SARS-CoV-2 infection and should not be used as the sole basis for treatment or other patient management decisions.  A negative result may occur with improper specimen collection / handling, submission of specimen other than nasopharyngeal swab, presence of viral mutation(s) within the areas targeted by this assay, and inadequate number of viral copies (<250 copies / mL). A negative result must be combined with clinical observations, patient history, and epidemiological information.  Fact Sheet for Patients:   RoadLapTop.co.za  Fact Sheet for Healthcare Providers: http://kim-miller.com/  This test is not yet approved or  cleared by the [place] FDA and has been authorized for detection and/or diagnosis of SARS-CoV-2 by FDA under an Emergency Use Authorization (EUA).  This EUA will remain in effect (meaning this test can be used) for the duration of the COVID-19 declaration under Section 564(b)(1) of the Act, 21 U.S.C. section 360bbb-3(b)(1), unless the authorization is terminated or revoked sooner.  Performed at [facility], [address] [place], [place] [address]      Labs: BNP (last 3 results) Recent Labs    06/02/23 1625  BNP 102.2*   Basic Metabolic Panel: Recent Labs  Lab 06/02/23 1625 06/03/23 0438  NA 137 136  K 4.0 3.7  CL 105 102  CO2 24 21*  GLUCOSE 140* 161*  BUN 24* 23  CREATININE 1.27* 1.20*   CALCIUM 8.9 9.0   Liver Function Tests: Recent Labs  Lab 06/02/23 1625  AST 25  ALT 19  ALKPHOS 67  BILITOT 0.8  PROT 7.2  ALBUMIN 4.1   No results for input(s): "LIPASE", "AMYLASE" in the last 168 hours. No results for input(s): "AMMONIA" in the last 168 hours. CBC: Recent Labs  Lab 06/02/23 1625 06/03/23 0438  WBC 9.2 9.8  HGB 14.2 13.4  HCT 43.8 41.5  MCV 96.1 94.3  PLT 314 303  Cardiac Enzymes: No results for input(s): "CKTOTAL", "CKMB", "CKMBINDEX", "TROPONINI" in the last 168 hours. BNP: Invalid input(s): "POCBNP" CBG: No results for input(s): "GLUCAP" in the last 168 hours. Wilson-Dimer No results for input(s): "DDIMER" in the last 72 hours. Hgb A1c No results for input(s): "HGBA1C" in the last 72 hours. Lipid Profile No results for input(s): "CHOL", "HDL", "LDLCALC", "TRIG", "CHOLHDL", "LDLDIRECT" in the last 72 hours. Thyroid function studies No results for input(s): "TSH", "T4TOTAL", "T3FREE", "THYROIDAB" in the last 72 hours.  Invalid input(s): "FREET3" Anemia work up No results for input(s): "VITAMINB12", "FOLATE", "FERRITIN", "TIBC", "IRON", "RETICCTPCT" in the last 72 hours. Urinalysis No results found for: "COLORURINE", "APPEARANCEUR", "LABSPEC", "PHURINE", "GLUCOSEU", "HGBUR", "BILIRUBINUR", "KETONESUR", "PROTEINUR", "UROBILINOGEN", "NITRITE", "LEUKOCYTESUR" Sepsis Labs Recent Labs  Lab 06/02/23 1625 06/03/23 0438  WBC 9.2 9.8   Microbiology Recent Results (from the past 240 hour(s))  SARS Coronavirus 2 by RT PCR (hospital order, performed in [facility] hospital lab) *cepheid single result test* Anterior Nasal Swab     Status: None   Collection Time: 06/02/23  6:32 PM   Specimen: Anterior Nasal Swab  Result Value Ref Range Status   SARS Coronavirus 2 by RT PCR NEGATIVE NEGATIVE Final    Comment: (NOTE) SARS-CoV-2 target nucleic acids are NOT DETECTED.  The SARS-CoV-2 RNA is generally detectable in upper and lower respiratory specimens  during the acute phase of infection. The lowest concentration of SARS-CoV-2 viral copies this assay can detect is 250 copies / mL. A negative result does not preclude SARS-CoV-2 infection and should not be used as the sole basis for treatment or other patient management decisions.  A negative result may occur with improper specimen collection / handling, submission of specimen other than nasopharyngeal swab, presence of viral mutation(s) within the areas targeted by this assay, and inadequate number of viral copies (<250 copies / mL). A negative result must be combined with clinical observations, patient history, and epidemiological information.  Fact Sheet for Patients:   RoadLapTop.co.za  Fact Sheet for Healthcare Providers: http://kim-miller.com/  This test is not yet approved or  cleared by the [place] FDA and has been authorized for detection and/or diagnosis of SARS-CoV-2 by FDA under an Emergency Use Authorization (EUA).  This EUA will remain in effect (meaning this test can be used) for the duration of the COVID-19 declaration under Section 564(b)(1) of the Act, 21 U.S.C. section 360bbb-3(b)(1), unless the authorization is terminated or revoked sooner.  Performed at [facility], [address] [place], [place] [address]    Imaging CT Angio Chest PE W/Cm &/Or Wo Cm  Result Date: 06/02/2023 CLINICAL DATA:  Pulmonary embolism (PE) suspected, high prob Shortness of breath. EXAM: CT ANGIOGRAPHY CHEST WITH CONTRAST TECHNIQUE: Multidetector CT imaging of the chest was performed using the standard protocol during bolus administration of intravenous contrast. Multiplanar CT image reconstructions and MIPs were obtained to evaluate the vascular anatomy. RADIATION DOSE REDUCTION: This exam was performed according to the departmental dose-optimization program which includes automated exposure control, adjustment of the mA and/or  kV according to patient size and/or use of iterative reconstruction technique. CONTRAST:  75mL OMNIPAQUE IOHEXOL 350 MG/ML SOLN COMPARISON:  Chest radiograph earlier today FINDINGS: Cardiovascular: There are no filling defects within the pulmonary arteries to suggest pulmonary embolus. Dilated main pulmonary artery at 3.5 cm. The heart is normal in size. There is no pericardial effusion. Aortic atherosclerosis and tortuosity. No acute aortic findings. Mediastinum/Nodes: No mediastinal adenopathy. Small left hilar lymph nodes, not enlarged  by size criteria. Large hiatal hernia with greater than 50% tent of the stomach being intrathoracic. The entire esophagus is patulous. No thyroid nodule. Lungs/Pleura: Compressive atelectasis in the left lung adjacent to a large hiatal hernia. Moderate bronchial thickening no findings of pneumonia or suspicious airspace disease. No pleural fluid. No pulmonary mass. Upper Abdomen: Large hiatal hernia with more than 50% of the stomach being intrathoracic. Ingested material is within the stomach both above and below the diaphragm. No acute upper abdominal findings. Musculoskeletal: Scattered Schmorl's nodes in the thoracic spine. There are no acute or suspicious osseous abnormalities. Review of the MIP images confirms the above findings. IMPRESSION: 1. No pulmonary embolus. 2. Dilated main pulmonary artery, suggestive of pulmonary arterial hypertension. 3. Large hiatal hernia with more than 50% of the stomach being intrathoracic. 4. Moderate bronchial thickening, can be seen with bronchitis or reactive airways disease. Aortic Atherosclerosis (ICD10-I70.0). Electronically Signed   By: Narda Rutherford M.Wilson.   On: 06/02/2023 17:43   DG Chest Portable 1 View  Result Date: 06/02/2023 CLINICAL DATA:  Sudden onset respiratory distress and difficulty swallowing. Shortness of breath. EXAM: PORTABLE CHEST 1 VIEW COMPARISON:  None Available. FINDINGS: Very large hiatal hernia extending into  the left hemithorax. This probably contains most of the stomach. Associated passive atelectasis. Cardiac and mediastinal margins appear normal. The lungs appear otherwise clear. No blunting of the costophrenic angles. No significant bony abnormality is identified. IMPRESSION: 1. Very large hiatal hernia extending into the left hemithorax, probably containing most of the stomach. Associated passive atelectasis. Electronically Signed   By: Gaylyn Rong M.Wilson.   On: 06/02/2023 17:01      Time coordinating discharge: over 30 minutes  SIGNED:  Sunnie Nielsen DO [facility]

## 2023-06-05 DIAGNOSIS — R051 Acute cough: Secondary | ICD-10-CM | POA: Diagnosis not present

## 2023-06-05 DIAGNOSIS — J301 Allergic rhinitis due to pollen: Secondary | ICD-10-CM | POA: Diagnosis not present

## 2023-06-05 DIAGNOSIS — J302 Other seasonal allergic rhinitis: Secondary | ICD-10-CM | POA: Diagnosis not present

## 2023-06-07 ENCOUNTER — Encounter: Payer: Self-pay | Admitting: Family Medicine

## 2023-06-07 ENCOUNTER — Ambulatory Visit: Payer: 59 | Admitting: Family Medicine

## 2023-06-07 VITALS — BP 110/74 | HR 87 | Temp 97.8°F | Ht 66.0 in | Wt 190.1 lb

## 2023-06-07 DIAGNOSIS — K449 Diaphragmatic hernia without obstruction or gangrene: Secondary | ICD-10-CM

## 2023-06-07 NOTE — Assessment & Plan Note (Signed)
New diagnosis, likely causing shortness of breath chest pressure. Encouraged patient to keep appointment with surgeon for possible hernia repair. Continue in the meantime with pantoprazole 40 mg daily.  Avoid acid triggers, eat small frequent meals, do not lay down after eating. Can continue using albuterol as needed,, chest  exam clear in office today without wheezing.  No hypoxia.  Return and ER precautions provided

## 2023-06-07 NOTE — Progress Notes (Signed)
Patient ID: Virginia Wilson, female    DOB: [birth year], 67 y.o.   MRN: [id number]  This visit was conducted in person.  BP 110/74 (BP Location: Left Arm, Patient Position: Sitting, Cuff Size: Large)   Pulse 87   Temp 97.8 F (36.6 C) (Temporal)   Ht 5\' 6"  (1.676 m)   Wt 190 lb 2 oz (86.2 kg)   SpO2 97%   BMI 30.69 kg/m    CC:  Chief Complaint  Patient presents with   Hospitalization Follow-up    Subjective:   HPI: Virginia Wilson is a 67 y.o. female presenting on 06/07/2023 for Hospitalization Follow-up   Reviewed recent hospital visit on 06/02/2023 for SOB after eating  Had had persistent cough for a long time but in last couple of weeks it had worsened.    Treated with IV solumedrol and DUonebs... after this she was able to wean off oxygen EKG showed sinus tachycardia  Chest x-ray showed large hiatal hernia but is otherwise unremarkable, CT imaging negative for PE but redemonstrates large hiatal hernia.  Labs  reassuring with no significant anemia, leukocytosis, tract abnormality, or AKI. Troponin within normal limits and VBG without significant hypercapnia.   Neg COVID test.  Discharge DX   bronchospasm Large hiatal hernia Elevated BP  She was given albuterol inhaler and pantoprazole 40 mg daily  Today she reports: She is still SOB, she is tired.  Eating small mild meals. No caffeine , no ETOH.   She has upcoming appt on Sept 10 to [facility] for  surgeon Dr. Darlin Drop for hiatal hernia.  Wt Readings from Last 3 Encounters:  06/07/23 190 lb 2 oz (86.2 kg)  02/01/23 193 lb (87.5 kg)  01/24/22 191 lb 5 oz (86.8 kg)    Unable to drive in to work  40 min each way... given fatigue and shortness of breath.. would like note to work from home... will likely need surgery.    BP Readings from Last 3 Encounters:  06/07/23 110/74  06/03/23 122/70  02/01/23 110/80    Relevant past medical, surgical, family and social history reviewed and updated as indicated. Interim medical  history since our last visit reviewed. Allergies and medications reviewed and updated. Outpatient Medications Prior to Visit  Medication Sig Dispense Refill   albuterol (VENTOLIN HFA) 108 (90 Base) MCG/ACT inhaler Inhale 2 puffs into the lungs every 6 (six) hours as needed for wheezing or shortness of breath. 8 g 2   allopurinol (ZYLOPRIM) 300 MG tablet TAKE 1 TABLET BY MOUTH DAILY 90 tablet 3   atorvastatin (LIPITOR) 40 MG tablet TAKE 1 TABLET BY MOUTH DAILY 90 tablet 3   betamethasone dipropionate 0.05 % cream Apply topically 2 (two) times daily. 15 g 2   clobetasol (TEMOVATE) 0.05 % external solution Patient to mix solution in jar of CeraVe Cream. Apply all over neck and back twice daily until itchy rash improved. Avoid face, groin, underarms. 50 mL 0   EPINEPHrine 0.3 mg/0.3 mL IJ SOAJ injection      ferrous sulfate 325 (65 FE) MG tablet Take 325 mg by mouth daily.      fluocinonide (LIDEX) 0.05 % external solution Apply 1 application topically daily as needed. 60 mL 3   fluticasone (FLONASE) 50 MCG/ACT nasal spray Place 1 spray into both nostrils daily.     hydrochlorothiazide (HYDRODIURIL) 25 MG tablet TAKE 1 TABLET BY MOUTH DAILY 90 tablet 3   ketoconazole (NIZORAL) 2 % shampoo Apply to scalp and  behind ears daily as needed for flares, left sit several minutes before rinsing. 120 mL 0   levocetirizine (XYZAL) 5 MG tablet Take 5 mg by mouth every evening.     levothyroxine (SYNTHROID) 50 MCG tablet Take 1 tablet (50 mcg total) by mouth daily before breakfast. 90 tablet 3   methylPREDNISolone (MEDROL DOSEPAK) 4 MG TBPK tablet See admin instructions.     Omega-3 Fatty Acids (FISH OIL) 1200 MG CAPS Take 1 capsule by mouth daily.     pantoprazole (PROTONIX) 40 MG tablet Take 1 tablet (40 mg total) by mouth 2 (two) times daily before a meal. 60 tablet 0   pimecrolimus (ELIDEL) 1 % cream Apply topically 2 (two) times daily. Apply 1-2 times a day to eyebrows and around ears as needed for rash. 30  g 2   nitroGLYCERIN (NITROSTAT) 0.4 MG SL tablet Place 1 tablet (0.4 mg total) under the tongue every 5 (five) minutes as needed for chest pain. 25 tablet 1   No facility-administered medications prior to visit.     Per HPI unless specifically indicated in ROS section below Review of Systems  Constitutional:  Positive for fatigue. Negative for fever.  HENT:  Negative for congestion.   Eyes:  Negative for pain.  Respiratory:  Positive for shortness of breath. Negative for cough.   Cardiovascular:  Negative for chest pain, palpitations and leg swelling.  Gastrointestinal:  Negative for abdominal pain.  Genitourinary:  Negative for dysuria and vaginal bleeding.  Musculoskeletal:  Negative for back pain.  Neurological:  Positive for weakness. Negative for syncope, light-headedness and headaches.  Psychiatric/Behavioral:  Negative for dysphoric mood.    Objective:  BP 110/74 (BP Location: Left Arm, Patient Position: Sitting, Cuff Size: Large)   Pulse 87   Temp 97.8 F (36.6 C) (Temporal)   Ht 5\' 6"  (1.676 m)   Wt 190 lb 2 oz (86.2 kg)   SpO2 97%   BMI 30.69 kg/m   Wt Readings from Last 3 Encounters:  06/07/23 190 lb 2 oz (86.2 kg)  02/01/23 193 lb (87.5 kg)  01/24/22 191 lb 5 oz (86.8 kg)      Physical Exam Constitutional:      General: She is not in acute distress.    Appearance: Normal appearance. She is well-developed. She is not ill-appearing or toxic-appearing.  HENT:     Head: Normocephalic.     Right Ear: Hearing, tympanic membrane, ear canal and external ear normal. Tympanic membrane is not erythematous, retracted or bulging.     Left Ear: Hearing, tympanic membrane, ear canal and external ear normal. Tympanic membrane is not erythematous, retracted or bulging.     Nose: No mucosal edema or rhinorrhea.     Right Sinus: No maxillary sinus tenderness or frontal sinus tenderness.     Left Sinus: No maxillary sinus tenderness or frontal sinus tenderness.     Mouth/Throat:      Mouth: Oropharynx is clear and moist and mucous membranes are normal.     Pharynx: Uvula midline.  Eyes:     General: Lids are normal. Lids are everted, no foreign bodies appreciated.     Extraocular Movements: EOM normal.     Conjunctiva/sclera: Conjunctivae normal.     Pupils: Pupils are equal, round, and reactive to light.  Neck:     Thyroid: No thyroid mass or thyromegaly.     Vascular: No carotid bruit.     Trachea: Trachea normal.  Cardiovascular:     Rate and  Rhythm: Normal rate and regular rhythm.     Pulses: Normal pulses.     Heart sounds: Normal heart sounds, S1 normal and S2 normal. No murmur heard.    No friction rub. No gallop.  Pulmonary:     Effort: Pulmonary effort is normal. No tachypnea or respiratory distress.     Breath sounds: Normal breath sounds. No decreased breath sounds, wheezing, rhonchi or rales.  Abdominal:     General: Bowel sounds are normal.     Palpations: Abdomen is soft.     Tenderness: There is no abdominal tenderness.  Musculoskeletal:     Cervical back: Normal range of motion and neck supple.  Skin:    General: Skin is warm, dry and intact.     Findings: No rash.  Neurological:     Mental Status: She is alert.  Psychiatric:        Mood and Affect: Mood is not anxious or depressed.        Speech: Speech normal.        Behavior: Behavior normal. Behavior is cooperative.        Thought Content: Thought content normal.        Cognition and Memory: Cognition and memory normal.        Judgment: Judgment normal.       Results for orders placed or performed during the hospital encounter of 06/02/23  SARS Coronavirus 2 by RT PCR (hospital order, performed in [facility] hospital lab) *cepheid single result test* Anterior Nasal Swab   Specimen: Anterior Nasal Swab  Result Value Ref Range   SARS Coronavirus 2 by RT PCR NEGATIVE NEGATIVE  CBC  Result Value Ref Range   WBC 9.2 4.0 - 10.5 K/uL   RBC 4.56 3.87 - 5.11 MIL/uL   Hemoglobin  14.2 12.0 - 15.0 g/dL   HCT 13.0 86.5 - 78.4 %   MCV 96.1 80.0 - 100.0 fL   MCH 31.1 26.0 - 34.0 pg   MCHC 32.4 30.0 - 36.0 g/dL   RDW 69.6 29.5 - 28.4 %   Platelets 314 150 - 400 K/uL   nRBC 0.0 0.0 - 0.2 %  Comprehensive metabolic panel  Result Value Ref Range   Sodium 137 135 - 145 mmol/L   Potassium 4.0 3.5 - 5.1 mmol/L   Chloride 105 98 - 111 mmol/L   CO2 24 22 - 32 mmol/L   Glucose, Bld 140 (H) 70 - 99 mg/dL   BUN 24 (H) 8 - 23 mg/dL   Creatinine, Ser 1.32 (H) 0.44 - 1.00 mg/dL   Calcium 8.9 8.9 - 44.0 mg/dL   Total Protein 7.2 6.5 - 8.1 g/dL   Albumin 4.1 3.5 - 5.0 g/dL   AST 25 15 - 41 U/L   ALT 19 0 - 44 U/L   Alkaline Phosphatase 67 38 - 126 U/L   Total Bilirubin 0.8 0.3 - 1.2 mg/dL   GFR, Estimated 47 (L) >60 mL/min   Anion gap 8 5 - 15  Blood gas, venous  Result Value Ref Range   pH, Ven 7.35 7.25 - 7.43   pCO2, Ven 48 44 - 60 mmHg   pO2, Ven 54 (H) 32 - 45 mmHg   Bicarbonate 26.5 20.0 - 28.0 mmol/L   Acid-Base Excess 0.3 0.0 - 2.0 mmol/L   O2 Saturation 86.6 %   Patient temperature 37.0    Collection site VENOUS   Brain natriuretic peptide  Result Value Ref Range   B Natriuretic  Peptide 102.2 (H) 0.0 - 100.0 pg/mL  HIV Antibody (routine testing w rflx)  Result Value Ref Range   HIV Screen 4th Generation wRfx Non Reactive Non Reactive  Basic metabolic panel  Result Value Ref Range   Sodium 136 135 - 145 mmol/L   Potassium 3.7 3.5 - 5.1 mmol/L   Chloride 102 98 - 111 mmol/L   CO2 21 (L) 22 - 32 mmol/L   Glucose, Bld 161 (H) 70 - 99 mg/dL   BUN 23 8 - 23 mg/dL   Creatinine, Ser 4.09 (H) 0.44 - 1.00 mg/dL   Calcium 9.0 8.9 - 81.1 mg/dL   GFR, Estimated 50 (L) >60 mL/min   Anion gap 13 5 - 15  CBC  Result Value Ref Range   WBC 9.8 4.0 - 10.5 K/uL   RBC 4.40 3.87 - 5.11 MIL/uL   Hemoglobin 13.4 12.0 - 15.0 g/dL   HCT 91.4 78.2 - 95.6 %   MCV 94.3 80.0 - 100.0 fL   MCH 30.5 26.0 - 34.0 pg   MCHC 32.3 30.0 - 36.0 g/dL   RDW 21.3 08.6 - 57.8 %    Platelets 303 150 - 400 K/uL   nRBC 0.0 0.0 - 0.2 %  Troponin I (High Sensitivity)  Result Value Ref Range   Troponin I (High Sensitivity) 3 <18 ng/L    Assessment and Plan  Large hiatal hernia Assessment & Plan: New diagnosis, likely causing shortness of breath chest pressure. Encouraged patient to keep appointment with surgeon for possible hernia repair. Continue in the meantime with pantoprazole 40 mg daily.  Avoid acid triggers, eat small frequent meals, do not lay down after eating. Can continue using albuterol as needed,, chest  exam clear in office today without wheezing.  No hypoxia.  Return and ER precautions provided     No follow-ups on file.   Kerby Nora, MD

## 2023-06-07 NOTE — Patient Instructions (Signed)
Avoid acid / reflux trigger  Keep appt as planned.

## 2023-06-08 ENCOUNTER — Telehealth: Payer: Self-pay | Admitting: Family Medicine

## 2023-06-08 DIAGNOSIS — Z0279 Encounter for issue of other medical certificate: Secondary | ICD-10-CM

## 2023-06-08 NOTE — Telephone Encounter (Signed)
Forms placed in Dr. Rometta Emery office in box to complete.

## 2023-06-08 NOTE — Telephone Encounter (Signed)
  Patient dropped off document  employee request  accommodation form , to be filled out by provider. Patient requested to send it back via Call Patient to pick up within 5-days. Document is located in providers tray at front office.Please advise at Mobile [phone] (mobile)

## 2023-06-08 NOTE — Telephone Encounter (Signed)
Completed healthcare provider portion of the form.  There is several areas patient needs to complete before sending back.  Have patient pick up

## 2023-06-12 NOTE — Telephone Encounter (Signed)
Virginia Wilson notified as instructed by telephone.  She will come by office and pick up form.  Forms placed up front.

## 2023-06-12 NOTE — Telephone Encounter (Signed)
Patient has picked up ppw

## 2023-06-15 DIAGNOSIS — M62830 Muscle spasm of back: Secondary | ICD-10-CM | POA: Diagnosis not present

## 2023-06-15 DIAGNOSIS — M9905 Segmental and somatic dysfunction of pelvic region: Secondary | ICD-10-CM | POA: Diagnosis not present

## 2023-06-15 DIAGNOSIS — M9903 Segmental and somatic dysfunction of lumbar region: Secondary | ICD-10-CM | POA: Diagnosis not present

## 2023-06-15 DIAGNOSIS — M955 Acquired deformity of pelvis: Secondary | ICD-10-CM | POA: Diagnosis not present

## 2023-06-18 DIAGNOSIS — M9903 Segmental and somatic dysfunction of lumbar region: Secondary | ICD-10-CM | POA: Diagnosis not present

## 2023-06-18 DIAGNOSIS — M955 Acquired deformity of pelvis: Secondary | ICD-10-CM | POA: Diagnosis not present

## 2023-06-18 DIAGNOSIS — M62830 Muscle spasm of back: Secondary | ICD-10-CM | POA: Diagnosis not present

## 2023-06-18 DIAGNOSIS — M9905 Segmental and somatic dysfunction of pelvic region: Secondary | ICD-10-CM | POA: Diagnosis not present

## 2023-06-21 DIAGNOSIS — K449 Diaphragmatic hernia without obstruction or gangrene: Secondary | ICD-10-CM | POA: Diagnosis not present

## 2023-06-21 DIAGNOSIS — I272 Pulmonary hypertension, unspecified: Secondary | ICD-10-CM | POA: Diagnosis not present

## 2023-07-05 DIAGNOSIS — K2289 Other specified disease of esophagus: Secondary | ICD-10-CM | POA: Diagnosis not present

## 2023-07-05 DIAGNOSIS — Z79899 Other long term (current) drug therapy: Secondary | ICD-10-CM | POA: Diagnosis not present

## 2023-07-05 DIAGNOSIS — E039 Hypothyroidism, unspecified: Secondary | ICD-10-CM | POA: Diagnosis not present

## 2023-07-05 DIAGNOSIS — Z79890 Hormone replacement therapy (postmenopausal): Secondary | ICD-10-CM | POA: Diagnosis not present

## 2023-07-05 DIAGNOSIS — K219 Gastro-esophageal reflux disease without esophagitis: Secondary | ICD-10-CM | POA: Diagnosis not present

## 2023-07-05 DIAGNOSIS — K449 Diaphragmatic hernia without obstruction or gangrene: Secondary | ICD-10-CM | POA: Diagnosis not present

## 2023-07-05 DIAGNOSIS — I10 Essential (primary) hypertension: Secondary | ICD-10-CM | POA: Diagnosis not present

## 2023-07-05 DIAGNOSIS — M109 Gout, unspecified: Secondary | ICD-10-CM | POA: Diagnosis not present

## 2023-07-10 DIAGNOSIS — I272 Pulmonary hypertension, unspecified: Secondary | ICD-10-CM | POA: Diagnosis not present

## 2023-07-10 DIAGNOSIS — I517 Cardiomegaly: Secondary | ICD-10-CM | POA: Diagnosis not present

## 2023-07-18 DIAGNOSIS — K449 Diaphragmatic hernia without obstruction or gangrene: Secondary | ICD-10-CM | POA: Diagnosis not present

## 2023-07-18 DIAGNOSIS — I10 Essential (primary) hypertension: Secondary | ICD-10-CM | POA: Diagnosis not present

## 2023-07-18 DIAGNOSIS — K3189 Other diseases of stomach and duodenum: Secondary | ICD-10-CM | POA: Diagnosis not present

## 2023-07-18 DIAGNOSIS — Z882 Allergy status to sulfonamides status: Secondary | ICD-10-CM | POA: Diagnosis not present

## 2023-07-18 DIAGNOSIS — Z79890 Hormone replacement therapy (postmenopausal): Secondary | ICD-10-CM | POA: Diagnosis not present

## 2023-07-18 DIAGNOSIS — K2970 Gastritis, unspecified, without bleeding: Secondary | ICD-10-CM | POA: Diagnosis not present

## 2023-07-18 DIAGNOSIS — K2950 Unspecified chronic gastritis without bleeding: Secondary | ICD-10-CM | POA: Diagnosis not present

## 2023-07-19 DIAGNOSIS — K2950 Unspecified chronic gastritis without bleeding: Secondary | ICD-10-CM | POA: Diagnosis not present

## 2023-07-23 ENCOUNTER — Telehealth: Payer: Self-pay | Admitting: Family Medicine

## 2023-07-23 NOTE — Telephone Encounter (Signed)
Accommodation Certification Form completed and placed in Dr. Daphine Deutscher office in box to sign.

## 2023-07-23 NOTE — Telephone Encounter (Signed)
Patient dropped off document  extended work form  , to be filled out by provider. Patient requested to send it back via Call Patient to pick up within 5-days. Document is located in providers tray at front office.Please advise at Mobile [phone] (mobile)

## 2023-07-24 DIAGNOSIS — Z0279 Encounter for issue of other medical certificate: Secondary | ICD-10-CM

## 2023-07-24 NOTE — Telephone Encounter (Signed)
Form is completed and in my outbox

## 2023-07-25 NOTE — Telephone Encounter (Addendum)
Completed Accomodation Certification Form faxed to [employer] at [phone].  Left message for Ms. Labella that forms have been faxed.

## 2023-08-13 DIAGNOSIS — K449 Diaphragmatic hernia without obstruction or gangrene: Secondary | ICD-10-CM | POA: Diagnosis not present

## 2023-08-24 ENCOUNTER — Telehealth: Payer: Self-pay | Admitting: Family Medicine

## 2023-08-24 DIAGNOSIS — Z0279 Encounter for issue of other medical certificate: Secondary | ICD-10-CM

## 2023-08-24 NOTE — Telephone Encounter (Signed)
Forms updated with new dates.  Placed in Dr. Barnabas Harries office in box to review.  If okay,  just return to me to fax back.

## 2023-08-24 NOTE — Telephone Encounter (Signed)
Patient dropped off document  accommodation form  , to be filled out by provider. Patient requested to send it back via Call Patient to pick up within 5-days. Document is located in providers tray at front office.Please advise at Mobile [phone] (mobile)

## 2023-08-24 NOTE — Telephone Encounter (Signed)
Forms faxed to [employer] at [phone].

## 2023-09-17 DIAGNOSIS — M9905 Segmental and somatic dysfunction of pelvic region: Secondary | ICD-10-CM | POA: Diagnosis not present

## 2023-09-17 DIAGNOSIS — M9903 Segmental and somatic dysfunction of lumbar region: Secondary | ICD-10-CM | POA: Diagnosis not present

## 2023-09-17 DIAGNOSIS — M62830 Muscle spasm of back: Secondary | ICD-10-CM | POA: Diagnosis not present

## 2023-09-17 DIAGNOSIS — M955 Acquired deformity of pelvis: Secondary | ICD-10-CM | POA: Diagnosis not present

## 2023-10-05 DIAGNOSIS — K219 Gastro-esophageal reflux disease without esophagitis: Secondary | ICD-10-CM | POA: Diagnosis not present

## 2023-10-05 DIAGNOSIS — K449 Diaphragmatic hernia without obstruction or gangrene: Secondary | ICD-10-CM | POA: Diagnosis not present

## 2024-01-28 ENCOUNTER — Other Ambulatory Visit: Payer: Self-pay | Admitting: Family Medicine

## 2024-02-13 ENCOUNTER — Other Ambulatory Visit: Payer: Self-pay | Admitting: Family Medicine

## 2024-02-13 ENCOUNTER — Encounter (HOSPITAL_COMMUNITY): Payer: Self-pay

## 2024-02-20 ENCOUNTER — Ambulatory Visit: Admitting: Family Medicine

## 2024-02-20 ENCOUNTER — Encounter: Payer: Self-pay | Admitting: Family Medicine

## 2024-02-20 VITALS — BP 112/68 | HR 56 | Temp 97.6°F | Ht 65.47 in | Wt 182.8 lb

## 2024-02-20 DIAGNOSIS — Z0000 Encounter for general adult medical examination without abnormal findings: Secondary | ICD-10-CM | POA: Diagnosis not present

## 2024-02-20 DIAGNOSIS — E7800 Pure hypercholesterolemia, unspecified: Secondary | ICD-10-CM | POA: Diagnosis not present

## 2024-02-20 DIAGNOSIS — E038 Other specified hypothyroidism: Secondary | ICD-10-CM

## 2024-02-20 DIAGNOSIS — M109 Gout, unspecified: Secondary | ICD-10-CM

## 2024-02-20 DIAGNOSIS — R7303 Prediabetes: Secondary | ICD-10-CM | POA: Diagnosis not present

## 2024-02-20 DIAGNOSIS — N1832 Chronic kidney disease, stage 3b: Secondary | ICD-10-CM

## 2024-02-20 DIAGNOSIS — I10 Essential (primary) hypertension: Secondary | ICD-10-CM

## 2024-02-20 DIAGNOSIS — D509 Iron deficiency anemia, unspecified: Secondary | ICD-10-CM

## 2024-02-20 LAB — CBC WITH DIFFERENTIAL/PLATELET
Basophils Absolute: 0 10*3/uL (ref 0.0–0.1)
Basophils Relative: 0.5 % (ref 0.0–3.0)
Eosinophils Absolute: 0.2 10*3/uL (ref 0.0–0.7)
Eosinophils Relative: 2.7 % (ref 0.0–5.0)
HCT: 43.5 % (ref 36.0–46.0)
Hemoglobin: 14.5 g/dL (ref 12.0–15.0)
Lymphocytes Relative: 27.3 % (ref 12.0–46.0)
Lymphs Abs: 2 10*3/uL (ref 0.7–4.0)
MCHC: 33.2 g/dL (ref 30.0–36.0)
MCV: 94.7 fl (ref 78.0–100.0)
Monocytes Absolute: 0.5 10*3/uL (ref 0.1–1.0)
Monocytes Relative: 6.9 % (ref 3.0–12.0)
Neutro Abs: 4.5 10*3/uL (ref 1.4–7.7)
Neutrophils Relative %: 62.6 % (ref 43.0–77.0)
Platelets: 288 10*3/uL (ref 150.0–400.0)
RBC: 4.6 Mil/uL (ref 3.87–5.11)
RDW: 16.1 % — ABNORMAL HIGH (ref 11.5–15.5)
WBC: 7.2 10*3/uL (ref 4.0–10.5)

## 2024-02-20 LAB — LIPID PANEL
Cholesterol: 204 mg/dL — ABNORMAL HIGH (ref 0–200)
HDL: 52.4 mg/dL (ref >39.00)
LDL Cholesterol: 109 mg/dL — ABNORMAL HIGH (ref 0–99)
NonHDL: 151.91
Total CHOL/HDL Ratio: 4
Triglycerides: 215 mg/dL — ABNORMAL HIGH (ref 0.0–149.0)
VLDL: 43 mg/dL — ABNORMAL HIGH (ref 0.0–40.0)

## 2024-02-20 LAB — T3, FREE: T3, Free: 3.3 pg/mL (ref 2.3–4.2)

## 2024-02-20 LAB — COMPREHENSIVE METABOLIC PANEL WITH GFR
ALT: 19 U/L (ref 0–35)
AST: 21 U/L (ref 0–37)
Albumin: 4.4 g/dL (ref 3.5–5.2)
Alkaline Phosphatase: 82 U/L (ref 39–117)
BUN: 22 mg/dL (ref 6–23)
CO2: 30 meq/L (ref 19–32)
Calcium: 9.7 mg/dL (ref 8.4–10.5)
Chloride: 100 meq/L (ref 96–112)
Creatinine, Ser: 1.08 mg/dL (ref 0.40–1.20)
GFR: 53.12 mL/min — ABNORMAL LOW (ref >60.00)
Glucose, Bld: 86 mg/dL (ref 70–99)
Potassium: 3.9 meq/L (ref 3.5–5.1)
Sodium: 139 meq/L (ref 135–145)
Total Bilirubin: 0.7 mg/dL (ref 0.2–1.2)
Total Protein: 6.9 g/dL (ref 6.0–8.3)

## 2024-02-20 LAB — T4, FREE: Free T4: 0.88 ng/dL (ref 0.60–1.60)

## 2024-02-20 LAB — URIC ACID: Uric Acid, Serum: 5 mg/dL (ref 2.4–7.0)

## 2024-02-20 LAB — HEMOGLOBIN A1C: Hgb A1c MFr Bld: 6 % (ref 4.6–6.5)

## 2024-02-20 LAB — TSH: TSH: 2.33 u[IU]/mL (ref 0.35–5.50)

## 2024-02-20 MED ORDER — HYDROCHLOROTHIAZIDE 25 MG PO TABS: 25.0000 mg | ORAL_TABLET | Freq: Every day | ORAL | 3 refills | Status: DC

## 2024-02-20 MED ORDER — ATORVASTATIN CALCIUM 40 MG PO TABS: 40.0000 mg | ORAL_TABLET | Freq: Every day | ORAL | 3 refills | Status: DC

## 2024-02-20 MED ORDER — LEVOTHYROXINE SODIUM 50 MCG PO TABS: 50.0000 ug | ORAL_TABLET | Freq: Every day | ORAL | 3 refills | Status: AC

## 2024-02-20 MED ORDER — ALLOPURINOL 300 MG PO TABS: 300.0000 mg | ORAL_TABLET | Freq: Every day | ORAL | 3 refills | Status: DC

## 2024-02-20 NOTE — Assessment & Plan Note (Signed)
 Chronic, stable Due for re-eval of uric acid.  No flares on allopurinol  300 mg p.o. daily

## 2024-02-20 NOTE — Assessment & Plan Note (Signed)
 Due for re-eval.

## 2024-02-20 NOTE — Assessment & Plan Note (Addendum)
Due for re-eval.  Levo 50 mcg daily

## 2024-02-20 NOTE — Assessment & Plan Note (Signed)
 Stable,  Due for re-eval.  Has seen renal in past.. felt secondary to HTN.

## 2024-02-20 NOTE — Assessment & Plan Note (Signed)
Stable, chronic.  Continue current medication.  HCTZ 25 mg daily 

## 2024-02-20 NOTE — Assessment & Plan Note (Signed)
 Due for re-eval.   Atorvastatin  40 mg daily

## 2024-02-20 NOTE — Progress Notes (Signed)
 Patient ID: Virginia Wilson, female    DOB: May 09, 1956, 68 y.o.   MRN: [id number]  This visit was conducted in person.  BP 112/68   Pulse (!) 56   Temp 97.6 F (36.4 C) (Oral)   Ht 5' 5.47" (1.663 m)   Wt 182 lb 12.8 oz (82.9 kg)   SpO2 96%   BMI 29.98 kg/m    CC: Chief Complaint  Patient presents with   Annual Exam   Medication Refill    Subjective:   HPI: Virginia Wilson is a 68 y.o. female presenting on 02/20/2024 for Annual Exam and Medication Refill   The patient presents for complete physical and review of chronic health problems. He/She also has the following acute concerns today: none  Recent hiatal hernia repair 09/2023, no complications.   Allergies: using flonase  and Xyzal.  Hypertension:  Well-controlled on hydrochlorothiazide  25 mg daily  BP Readings from Last 3 Encounters:  02/20/24 112/68  06/07/23 110/74  06/03/23 122/70  Using medication without problems or lightheadedness:  none Chest pain with exertion: none Edema: none Short of breath: none Average home BPs: Other issues:  Elevated Cholesterol: Well-controlled with LDL at goal less than 100 on atorvastatin  40 mg daily. Lab Results  Component Value Date   CHOL 150 01/25/2023   HDL 48.50 01/25/2023   LDLCALC 61 09/10/2018   LDLDIRECT 43.0 01/25/2023   TRIG 206.0 (H) 01/25/2023   CHOLHDL 3 01/25/2023  Using medications without problems: none Muscle aches:  Diet compliance: moderate Exercise:  walking 3-4 times a week Other complaints:;   Hypothyroid:  on levo 25 mcg daily.. due for ree-val. Lab Results  Component Value Date   TSH 1.88 03/08/2023     Iron def anemia: Due for re-eval.   Gout: No recent flares and uric acid at goal  in past  on allopurinol  300 mg daily Lab Results  Component Value Date   LABURIC 4.4 01/25/2023      Prediabetes: Stable control with diet. Lab Results  Component Value Date   HGBA1C 6.1 01/25/2023   Wt Readings from Last 3 Encounters:  02/20/24 182  lb 12.8 oz (82.9 kg)  06/07/23 190 lb 2 oz (86.2 kg)  02/01/23 193 lb (87.5 kg)     Hypothyroid Free T3 and free T4 in normal range on levothyroxine  25 mcg daily Lab Results  Component Value Date   TSH 1.88 03/08/2023   CKD: Stable Avoiding NSAIDs, drinks lots of water.  GFR stable at 46  Saw Nephrology in 2018.. no further workup needed, no family history of kidney issues.     Relevant past medical, surgical, family and social history reviewed and updated as indicated. Interim medical history since our last visit reviewed. Allergies and medications reviewed and updated. Outpatient Medications Prior to Visit  Medication Sig Dispense Refill   albuterol  (VENTOLIN  HFA) 108 (90 Base) MCG/ACT inhaler Inhale 2 puffs into the lungs every 6 (six) hours as needed for wheezing or shortness of breath. 8 g 2   allopurinol  (ZYLOPRIM ) 300 MG tablet TAKE 1 TABLET BY MOUTH DAILY 90 tablet 3   atorvastatin  (LIPITOR) 40 MG tablet TAKE 1 TABLET BY MOUTH DAILY 90 tablet 0   betamethasone  dipropionate 0.05 % cream Apply topically 2 (two) times daily. 15 g 2   clobetasol  (TEMOVATE ) 0.05 % external solution Patient to mix solution in jar of CeraVe Cream. Apply all over neck and back twice daily until itchy rash improved. Avoid face, groin, underarms. 50  mL 0   EPINEPHrine  0.3 mg/0.3 mL IJ SOAJ injection      ferrous sulfate  325 (65 FE) MG tablet Take 325 mg by mouth daily.      fluocinonide  (LIDEX ) 0.05 % external solution Apply 1 application topically daily as needed. 60 mL 3   fluticasone  (FLONASE ) 50 MCG/ACT nasal spray Place 1 spray into both nostrils daily.     hydrochlorothiazide  (HYDRODIURIL ) 25 MG tablet TAKE 1 TABLET BY MOUTH DAILY 90 tablet 0   ketoconazole  (NIZORAL ) 2 % shampoo Apply to scalp and behind ears daily as needed for flares, left sit several minutes before rinsing. 120 mL 0   levocetirizine (XYZAL) 5 MG tablet Take 5 mg by mouth every evening.     levothyroxine  (SYNTHROID ) 50 MCG  tablet Take 1 tablet (50 mcg total) by mouth daily before breakfast. 90 tablet 3   Omega-3 Fatty Acids (FISH OIL) 1200 MG CAPS Take 1 capsule by mouth daily.     omeprazole (PRILOSEC) 20 MG capsule Take 20 mg by mouth as needed.     pimecrolimus  (ELIDEL ) 1 % cream Apply topically 2 (two) times daily. Apply 1-2 times a day to eyebrows and around ears as needed for rash. 30 g 2   nitroGLYCERIN  (NITROSTAT ) 0.4 MG SL tablet Place 1 tablet (0.4 mg total) under the tongue every 5 (five) minutes as needed for chest pain. 25 tablet 1   methylPREDNISolone  (MEDROL  DOSEPAK) 4 MG TBPK tablet See admin instructions.     pantoprazole  (PROTONIX ) 40 MG tablet Take 1 tablet (40 mg total) by mouth 2 (two) times daily before a meal. 60 tablet 0   No facility-administered medications prior to visit.     Per HPI unless specifically indicated in ROS section below Review of Systems  Constitutional:  Negative for fatigue and fever.  HENT:  Negative for congestion.   Eyes:  Negative for pain.  Respiratory:  Negative for cough and shortness of breath.   Cardiovascular:  Negative for chest pain, palpitations and leg swelling.  Gastrointestinal:  Negative for abdominal pain.       Mild occasional right lower quadrant pain, now resolved  Genitourinary:  Negative for dysuria and vaginal bleeding.  Musculoskeletal:  Negative for back pain.  Neurological:  Negative for syncope, light-headedness and headaches.  Psychiatric/Behavioral:  Negative for dysphoric mood.    Objective:  BP 112/68   Pulse (!) 56   Temp 97.6 F (36.4 C) (Oral)   Ht 5' 5.47" (1.663 m)   Wt 182 lb 12.8 oz (82.9 kg)   SpO2 96%   BMI 29.98 kg/m   Wt Readings from Last 3 Encounters:  02/20/24 182 lb 12.8 oz (82.9 kg)  06/07/23 190 lb 2 oz (86.2 kg)  02/01/23 193 lb (87.5 kg)      Physical Exam Vitals and nursing note reviewed.  Constitutional:      General: She is not in acute distress.    Appearance: Normal appearance. She is  well-developed. She is not ill-appearing or toxic-appearing.  HENT:     Head: Normocephalic.     Right Ear: Hearing, tympanic membrane, ear canal and external ear normal. Tympanic membrane is not erythematous, retracted or bulging.     Left Ear: Hearing, tympanic membrane, ear canal and external ear normal. Tympanic membrane is not erythematous, retracted or bulging.     Nose: Nose normal. No mucosal edema or rhinorrhea.     Right Sinus: No maxillary sinus tenderness or frontal sinus tenderness.  Left Sinus: No maxillary sinus tenderness or frontal sinus tenderness.     Mouth/Throat:     Pharynx: Uvula midline.  Eyes:     General: Lids are normal. Lids are everted, no foreign bodies appreciated.     Conjunctiva/sclera: Conjunctivae normal.     Pupils: Pupils are equal, round, and reactive to light.  Neck:     Thyroid : No thyroid  mass or thyromegaly.     Vascular: No carotid bruit.     Trachea: Trachea normal.  Cardiovascular:     Rate and Rhythm: Normal rate and regular rhythm.     Pulses: Normal pulses.     Heart sounds: Normal heart sounds, S1 normal and S2 normal. No murmur heard.    No friction rub. No gallop.  Pulmonary:     Effort: Pulmonary effort is normal. No tachypnea or respiratory distress.     Breath sounds: Normal breath sounds. No decreased breath sounds, wheezing, rhonchi or rales.  Abdominal:     General: Bowel sounds are normal. There is no distension or abdominal bruit.     Palpations: Abdomen is soft. There is no fluid wave or mass.     Tenderness: There is no abdominal tenderness. There is no guarding or rebound.     Hernia: No hernia is present.  Musculoskeletal:     Cervical back: Normal range of motion and neck supple.  Lymphadenopathy:     Cervical: No cervical adenopathy.  Skin:    General: Skin is warm and dry.     Findings: No rash.  Neurological:     Mental Status: She is alert.     Cranial Nerves: No cranial nerve deficit.     Sensory: No  sensory deficit.  Psychiatric:        Mood and Affect: Mood is not anxious or depressed.        Speech: Speech normal.        Behavior: Behavior normal. Behavior is cooperative.        Thought Content: Thought content normal.        Judgment: Judgment normal.       Results for orders placed or performed during the hospital encounter of 06/02/23  CBC   Collection Time: 06/02/23  4:25 PM  Result Value Ref Range   WBC 9.2 4.0 - 10.5 K/uL   RBC 4.56 3.87 - 5.11 MIL/uL   Hemoglobin 14.2 12.0 - 15.0 g/dL   HCT 16.1 09.6 - 04.5 %   MCV 96.1 80.0 - 100.0 fL   MCH 31.1 26.0 - 34.0 pg   MCHC 32.4 30.0 - 36.0 g/dL   RDW 40.9 81.1 - 91.4 %   Platelets 314 150 - 400 K/uL   nRBC 0.0 0.0 - 0.2 %  Comprehensive metabolic panel   Collection Time: 06/02/23  4:25 PM  Result Value Ref Range   Sodium 137 135 - 145 mmol/L   Potassium 4.0 3.5 - 5.1 mmol/L   Chloride 105 98 - 111 mmol/L   CO2 24 22 - 32 mmol/L   Glucose, Bld 140 (H) 70 - 99 mg/dL   BUN 24 (H) 8 - 23 mg/dL   Creatinine, Ser 7.82 (H) 0.44 - 1.00 mg/dL   Calcium  8.9 8.9 - 10.3 mg/dL   Total Protein 7.2 6.5 - 8.1 g/dL   Albumin 4.1 3.5 - 5.0 g/dL   AST 25 15 - 41 U/L   ALT 19 0 - 44 U/L   Alkaline Phosphatase 67 38 - 126 U/L  Total Bilirubin 0.8 0.3 - 1.2 mg/dL   GFR, Estimated 47 (L) >60 mL/min   Anion gap 8 5 - 15  Blood gas, venous   Collection Time: 06/02/23  4:25 PM  Result Value Ref Range   pH, Ven 7.35 7.25 - 7.43   pCO2, Ven 48 44 - 60 mmHg   pO2, Ven 54 (H) 32 - 45 mmHg   Bicarbonate 26.5 20.0 - 28.0 mmol/L   Acid-Base Excess 0.3 0.0 - 2.0 mmol/L   O2 Saturation 86.6 %   Patient temperature 37.0    Collection site VENOUS   Brain natriuretic peptide   Collection Time: 06/02/23  4:25 PM  Result Value Ref Range   B Natriuretic Peptide 102.2 (H) 0.0 - 100.0 pg/mL  Troponin I (High Sensitivity)   Collection Time: 06/02/23  4:25 PM  Result Value Ref Range   Troponin I (High Sensitivity) 3 <18 ng/L  SARS  Coronavirus 2 by RT PCR (hospital order, performed in [facility] hospital lab) *cepheid single result test* Anterior Nasal Swab   Collection Time: 06/02/23  6:32 PM   Specimen: Anterior Nasal Swab  Result Value Ref Range   SARS Coronavirus 2 by RT PCR NEGATIVE NEGATIVE  HIV Antibody (routine testing w rflx)   Collection Time: 06/02/23  7:55 PM  Result Value Ref Range   HIV Screen 4th Generation wRfx Non Reactive Non Reactive  Basic metabolic panel   Collection Time: 06/03/23  4:38 AM  Result Value Ref Range   Sodium 136 135 - 145 mmol/L   Potassium 3.7 3.5 - 5.1 mmol/L   Chloride 102 98 - 111 mmol/L   CO2 21 (L) 22 - 32 mmol/L   Glucose, Bld 161 (H) 70 - 99 mg/dL   BUN 23 8 - 23 mg/dL   Creatinine, Ser 1.61 (H) 0.44 - 1.00 mg/dL   Calcium  9.0 8.9 - 10.3 mg/dL   GFR, Estimated 50 (L) >60 mL/min   Anion gap 13 5 - 15  CBC   Collection Time: 06/03/23  4:38 AM  Result Value Ref Range   WBC 9.8 4.0 - 10.5 K/uL   RBC 4.40 3.87 - 5.11 MIL/uL   Hemoglobin 13.4 12.0 - 15.0 g/dL   HCT 09.6 04.5 - 40.9 %   MCV 94.3 80.0 - 100.0 fL   MCH 30.5 26.0 - 34.0 pg   MCHC 32.3 30.0 - 36.0 g/dL   RDW 81.1 91.4 - 78.2 %   Platelets 303 150 - 400 K/uL   nRBC 0.0 0.0 - 0.2 %    This visit occurred during the SARS-CoV-2 public health emergency.  Safety protocols were in place, including screening questions prior to the visit, additional usage of staff PPE, and extensive cleaning of exam room while observing appropriate contact time as indicated for disinfecting solutions.   COVID 19 screen:  No recent travel or known exposure to COVID19 The patient denies respiratory symptoms of COVID 19 at this time. The importance of social distancing was discussed today.   Assessment and Plan   The patient's preventative maintenance and recommended screening tests for an annual wellness exam were reviewed in full today. Brought up to date unless services declined.  Counselled on the importance of diet,  exercise, and its role in overall health and mortality. The patient's FH and SH was reviewed, including their home life, tobacco status, and drug and alcohol status.     PAP/DVE: 09/2019 nml, no HPV, Asymptomatic , no family history of ovarian or  endometrial cancer, no further indicated give age. Mammo: nml 2024, plan repeat every 2 years.   DEXA:  01/2023 nml, repeat Colon:colonoscopy 06/2017 polyps, tics: repeat in 5 years.... she declines further evaluation. Vaccines: Uptodate. Refused flu vaccine, shingrix and Prevnar 20, COVID x 3  Booster due Hep C: neg XLK:GMWNUUV. Nonsmoker No ETOH use.  Problem List Items Addressed This Visit     CKD (chronic kidney disease) stage 3, GFR 30-59 ml/min (HCC)   Stable,  Due for re-eval.  Has seen renal in past.. felt secondary to HTN.       Essential hypertension, benign   Stable, chronic.  Continue current medication.    HCTZ 25 mg daily      Gout with manifestations   Chronic, stable Due for re-eval of uric acid.  No flares on allopurinol  300 mg p.o. daily      Relevant Orders   Uric acid   HYPERCHOLESTEROLEMIA   Due for re-eval.   Atorvastatin  40 mg daily        Relevant Orders   Lipid panel   Comprehensive metabolic panel with GFR   Hypothyroidism   Due for re-eval.  Levo 50 mcg daily      Relevant Orders   TSH   T4, free   T3, free   Iron deficiency anemia   Due for re-eval.      Relevant Orders   CBC with Differential/Platelet   Prediabetes   Due for re-eval.      Relevant Orders   Hemoglobin A1c   Other Visit Diagnoses       Routine general medical examination at a health care facility    -  Primary       Herby Lolling, MD

## 2024-02-20 NOTE — Addendum Note (Signed)
 Addended by: Herby Lolling E on: 02/20/2024 12:18 PM   Modules accepted: Orders

## 2024-02-22 ENCOUNTER — Other Ambulatory Visit: Payer: Self-pay | Admitting: Family Medicine

## 2024-02-22 ENCOUNTER — Ambulatory Visit: Payer: Self-pay | Admitting: Family Medicine

## 2024-02-22 MED ORDER — ATORVASTATIN CALCIUM 80 MG PO TABS: 80.0000 mg | ORAL_TABLET | Freq: Every day | ORAL | 3 refills | Status: AC

## 2024-02-22 MED ORDER — ATORVASTATIN CALCIUM 80 MG PO TABS
80.0000 mg | ORAL_TABLET | Freq: Every day | ORAL | 3 refills | Status: DC
Start: 2024-02-22 — End: 2024-02-22

## 2024-05-13 DIAGNOSIS — M62830 Muscle spasm of back: Secondary | ICD-10-CM | POA: Diagnosis not present

## 2024-05-13 DIAGNOSIS — M955 Acquired deformity of pelvis: Secondary | ICD-10-CM | POA: Diagnosis not present

## 2024-05-13 DIAGNOSIS — M9903 Segmental and somatic dysfunction of lumbar region: Secondary | ICD-10-CM | POA: Diagnosis not present

## 2024-05-13 DIAGNOSIS — M9905 Segmental and somatic dysfunction of pelvic region: Secondary | ICD-10-CM | POA: Diagnosis not present

## 2024-07-17 DIAGNOSIS — M9905 Segmental and somatic dysfunction of pelvic region: Secondary | ICD-10-CM | POA: Diagnosis not present

## 2024-07-17 DIAGNOSIS — M62830 Muscle spasm of back: Secondary | ICD-10-CM | POA: Diagnosis not present

## 2024-07-17 DIAGNOSIS — M955 Acquired deformity of pelvis: Secondary | ICD-10-CM | POA: Diagnosis not present

## 2024-07-17 DIAGNOSIS — M9903 Segmental and somatic dysfunction of lumbar region: Secondary | ICD-10-CM | POA: Diagnosis not present

## 2024-07-23 DIAGNOSIS — M9903 Segmental and somatic dysfunction of lumbar region: Secondary | ICD-10-CM | POA: Diagnosis not present

## 2024-07-23 DIAGNOSIS — M9905 Segmental and somatic dysfunction of pelvic region: Secondary | ICD-10-CM | POA: Diagnosis not present

## 2024-07-23 DIAGNOSIS — M955 Acquired deformity of pelvis: Secondary | ICD-10-CM | POA: Diagnosis not present

## 2024-07-23 DIAGNOSIS — M62830 Muscle spasm of back: Secondary | ICD-10-CM | POA: Diagnosis not present

## 2024-07-28 DIAGNOSIS — M955 Acquired deformity of pelvis: Secondary | ICD-10-CM | POA: Diagnosis not present

## 2024-07-28 DIAGNOSIS — M9905 Segmental and somatic dysfunction of pelvic region: Secondary | ICD-10-CM | POA: Diagnosis not present

## 2024-07-28 DIAGNOSIS — M62830 Muscle spasm of back: Secondary | ICD-10-CM | POA: Diagnosis not present

## 2024-07-28 DIAGNOSIS — M9903 Segmental and somatic dysfunction of lumbar region: Secondary | ICD-10-CM | POA: Diagnosis not present

## 2024-07-30 DIAGNOSIS — M9905 Segmental and somatic dysfunction of pelvic region: Secondary | ICD-10-CM | POA: Diagnosis not present

## 2024-07-30 DIAGNOSIS — M955 Acquired deformity of pelvis: Secondary | ICD-10-CM | POA: Diagnosis not present

## 2024-07-30 DIAGNOSIS — M9903 Segmental and somatic dysfunction of lumbar region: Secondary | ICD-10-CM | POA: Diagnosis not present

## 2024-07-30 DIAGNOSIS — M62830 Muscle spasm of back: Secondary | ICD-10-CM | POA: Diagnosis not present

## 2024-08-04 ENCOUNTER — Ambulatory Visit: Payer: Self-pay | Admitting: *Deleted

## 2024-08-04 NOTE — Telephone Encounter (Signed)
Will see at OV.  Thanks.  

## 2024-08-04 NOTE — Telephone Encounter (Signed)
 Appt scheduled tomorrow with different provider due to requesting am appt only , per patient request. None available with PCP until 2 pm.  FYI Only or Action Required?: Action required by provider: request for appointment.  Patient was last seen in primary care on 02/20/2024 by Avelina Greig BRAVO, MD.  Called Nurse Triage reporting Leg Swelling.  Symptoms began several days ago.  Interventions attempted: Rest, hydration, or home remedies.  Symptoms are: unchanged.  Triage Disposition: See PCP When Office is Open (Within 3 Days)  Patient/caregiver understands and will follow disposition?: Yes                Copied from CRM #[id number]. Topic: Clinical - Red Word Triage >> Aug 04, 2024  8:09 AM Virginia Wilson wrote: [facility] that prompted transfer to Nurse Triage: Swollen and sore right leg.. slipped last week.. Reason for Disposition  MILD weakness (e.Wilson., does not interfere with ability to work, go to school, normal activities)  (Exception: Mild weakness is a chronic symptom.)  Answer Assessment - Initial Assessment Questions Appt scheduled for tomorrow with other provider. None available with PCP and patient requesting appt early am . None available with PCP until 2pm.  Please advise if any available appt today with PCP.      1. MECHANISM: How did the fall happen?     Stepped with right leg and gave way went underneath body and fell 07/29/24 2. DOMESTIC VIOLENCE AND ELDER ABUSE SCREENING: Did you fall because someone pushed you or tried to hurt you? If Yes, ask: Are you safe now?     na 3. ONSET: When did the fall happen? (e.Wilson., minutes, hours, or days ago)     07/29/24 4. LOCATION: What part of the body hit the ground? (e.Wilson., back, buttocks, head, hips, knees, hands, head, stomach)     Right leg and right hand / finger 5. INJURY: Did you hurt (injure) yourself when you fell? If Yes, ask: What did you injure? Tell me more about this? (e.Wilson., body area; type of  injury; pain severity)     Not sure but right leg is sore and swollen  6. PAIN: Is there any pain? If Yes, ask: How bad is the pain? (e.Wilson., Scale 0-10; or none, mild,      5-6/10 7. SIZE: For cuts, bruises, or swelling, ask: How large is it? (e.Wilson., inches or centimeters)      Swelling above right knee , cut to right pinky finger 8. PREGNANCY: Is there any chance you are pregnant? When was your last menstrual period?     na 9. OTHER SYMPTOMS: Do you have any other symptoms? (e.Wilson., dizziness, fever, weakness; new-onset or worsening).      Right leg or foot slipped stepping off threshold.  10. CAUSE: What do you think caused the fall (or falling)? (e.Wilson., dizzy spell, tripped)       Virginia Wilson  Protocols used: Falls and [facility]

## 2024-08-05 ENCOUNTER — Ambulatory Visit (INDEPENDENT_AMBULATORY_CARE_PROVIDER_SITE_OTHER): Admitting: Family Medicine

## 2024-08-05 ENCOUNTER — Encounter: Payer: Self-pay | Admitting: Family Medicine

## 2024-08-05 VITALS — BP 126/82 | HR 74 | Temp 97.9°F | Ht 65.47 in | Wt 206.4 lb

## 2024-08-05 DIAGNOSIS — M25561 Pain in right knee: Secondary | ICD-10-CM

## 2024-08-05 MED ORDER — DICLOFENAC SODIUM 1 % EX GEL: 2.0000 g | Freq: Four times a day (QID) | CUTANEOUS | Status: AC | PRN

## 2024-08-05 NOTE — Progress Notes (Unsigned)
 R knee pain started in 06/2024, when driving, with walking.  She was working more and had R sided back pain.  Then had R leg pain, saw chiropractor about sciatica.   Pt slipped and fell down steps, at home, on 07/29/24. Landed on her backside, didn't hit her head.  No LOC.  She was able to get up on her own.  C/o R leg pain/swelling after that.  Medial R knee puffy.  Then R anterior distal thigh puffy.  No bruising locally.  Still sore at R knee and R thigh.  More pain with more walking.  She didn't trap her ankle/foot with the fall.  Tried ice and heat in the meantime, took aleve w/o relief.  No bruising.   Meds, vitals, and allergies reviewed.   ROS: Per HPI unless specifically indicated in ROS section   Normal R hip and ankle ROM.    R knee exam only.    B calf 42 cm circumference.   R knee puffy  R distal quad puffy No bruising.   Medial and lateral joint line ttp Patella not quad ligament not ttp Puffy proximal to the patella.   Minimal crepitus.

## 2024-08-05 NOTE — Patient Instructions (Addendum)
 I would try icing for 5 minutes at a time and use a compression sleeve.  I would try using diclofenac gel as needed.   Update us  if not better in a few days.   Take care.  Glad to see you.

## 2024-08-05 NOTE — Telephone Encounter (Signed)
 Appointment with Dr. Cleatus 08/05/2024 at 9:00 am

## 2024-08-06 DIAGNOSIS — M25561 Pain in right knee: Secondary | ICD-10-CM | POA: Insufficient documentation

## 2024-08-06 NOTE — Assessment & Plan Note (Signed)
 She likely has a combination of underlying arthritis that was aggravated along with likely bursitis.  Anatomy discussed with patient. I would try icing for 5 minutes at a time and use a compression sleeve.  I would try using diclofenac gel as needed.   Update us  if not better in a few days.   Okay to defer imaging at this point.  She agrees to plan.

## 2024-09-28 ENCOUNTER — Other Ambulatory Visit: Payer: Self-pay | Admitting: Family Medicine

## 2024-09-29 ENCOUNTER — Encounter: Payer: Self-pay | Admitting: Family Medicine

## 2024-09-30 NOTE — Progress Notes (Unsigned)
 "    Kenedie Dirocco T. Launa Goedken, MD, CAQ Sports Medicine [facility] [address] [place], [address]  Phone: [phone]  FAX: [phone]  Virginia Wilson - 68 y.o. female  MRN [id number]  Date of Birth: 1956/06/26  Date: 10/01/2024  PCP: Avelina Greig BRAVO, MD  Referral: Avelina Greig BRAVO, MD  Chief Complaint  Patient presents with   Leg Pain    Right-Fell down a copy of stairs back in October Seen Dr. Cleatus 08/05/24   Neck Pain    X 5 weeks   Subjective:   Virginia Wilson is a 68 y.o. very pleasant female patient with Body mass index is 33.87 kg/m. who presents with the following:  Discussed the use of AI scribe software for clinical note transcription with the patient, who gave verbal consent to proceed.  Patient presents with neck pain leg pain.  She saw Dr. Cleatus on August 05, 2024 with some ongoing right-sided knee pain.  At that time, she slipped on the stairs.  She has also seen a chiropractor for her neck, which has been hurting for roughly the last 5 weeks. History of Present Illness Virginia Wilson is a 68 year old female who presents with persistent knee and neck pain following a fall.  In mid-October, she slipped down a couple of stairs, leading to persistent swelling and pain in her knee. Initially, the pain was localized to the knee, but it has since spread to the inner side of the knee and the back of the leg. The pain worsens with prolonged sitting and certain activities.  She used a compression brace, which initially helped, but she stopped using it in the last couple of weeks as the pain increased. - No locking up of the joint.  No prior surgery.  About five weeks ago, her neck began to hurt spontaneously, with pain radiating from the neck down. There was no associated injury. She sought chiropractic adjustments and massage therapy, which did not alleviate the pain. The neck pain is variable, sometimes allowing her to sleep through the  night, but often requiring her to change positions frequently to manage the pain. No numbness or weakness in the neck or arms is reported.  She experienced a sudden onset of shoulder pain that resolved within 24 hours. She occasionally uses Voltaren  on her neck when the pain becomes severe.  She has a history of impaired kidney function, which influences her use of medications like Aleve, which she takes sparingly.    Review of Systems is noted in the HPI, as appropriate  Objective:   BP 104/80   Pulse 75   Temp 98 F (36.7 C) (Temporal)   Ht 5' 5.47 (1.663 m)   Wt 206 lb 8 oz (93.7 kg)   SpO2 93%   BMI 33.87 kg/m   GEN: No acute distress; alert,appropriate. PULM: Breathing comfortably in no respiratory distress PSYCH: Normally interactive.   Right knee: Mild effusion.  There is some impairment with patellar motion and crepitus Stable to varus and valgus stress, very mild pain with valgus stress Medial joint line tenderness, more than lateral joint line tenderness She does have fullness in the popliteal fossa ACL and PCL are intact Mild pain with flexion pinch and McMurray's Bounce home testing is normal   CERVICAL SPINE EXAM Range of motion: Flexion, extension, lateral bending, and rotation: Approaching full motion Spurling's: Negative Pain with terminal motion: Mild Spinous Processes: NT SCM: NT Upper paracervical muscles:  Tender to palpation posterior occiput Upper traps: NT C5-T1 intact, sensation and motor   Laboratory and Imaging Data:  Assessment and Plan:     ICD-10-CM   1. Acute pain of right knee  M25.561 DG Knee 4 Views W/Patella Right    triamcinolone  acetonide (KENALOG -40) injection 40 mg    2. Cervicalgia  M54.2     3. Primary osteoarthritis of right knee  M17.11      Assessment & Plan Right knee injury with pain and mild osteoarthritis Persistent pain and swelling post-fall, likely due to medial collateral ligament sprain and possible  meniscal injury. X-ray shows mild osteoarthritis.  Adjacent muscular pain attributed to initial injury and compensatory mechanisms. - Administered knee injection with Kenalog  and lidocaine. - Prescribed prednisone  20 mg: 2 tablets daily for 5 days, then 1 tablet daily for 5 days. - Advised to monitor response over two weeks. - Consider physical therapy if pain persists beyond two weeks.  Cervicalgia Neck pain for six weeks without injury history. No nerve compromise.  - Advised use of Voltaren  as needed. - Encouraged neck motion exercises. - Consider physical therapy if symptoms persist.  Aspiration/Injection Procedure Note ANNALIS KACZMARCZYK [birth year] Date of procedure: 10/01/2024  Procedure: Large Joint Aspiration / Injection of Knee, R Indications: Pain  Procedure Details Patient verbally consented to procedure. Risks, benefits, and alternatives explained. Sterilely prepped with Chloraprep. Ethyl cholride used for anesthesia. 9 cc Lidocaine 1% mixed with 1 mL of Kenalog  40 mg injected using the anteromedial approach without difficulty. No complications with procedure and tolerated well. Patient had decreased pain post-injection. Medication: 1 mL of Kenalog  40 mg   Medication Management during today's office visit: Meds ordered this encounter  Medications   albuterol  (VENTOLIN  HFA) 108 (90 Base) MCG/ACT inhaler    Sig: Inhale 2 puffs into the lungs every 6 (six) hours as needed for wheezing or shortness of breath.    Dispense:  8 g    Refill:  2   predniSONE  (DELTASONE ) 20 MG tablet    Sig: 2 tabs po daily for 5 days, then 1 tab po daily for 5 days    Dispense:  15 tablet    Refill:  0   triamcinolone  acetonide (KENALOG -40) injection 40 mg   Medications Discontinued During This Encounter  Medication Reason   albuterol  (VENTOLIN  HFA) 108 (90 Base) MCG/ACT inhaler Reorder    Orders placed today for conditions managed today: Orders Placed This Encounter  Procedures   DG Knee 4  Views W/Patella Right    Disposition: No follow-ups on file.  [employer] speech-to-text software was used for transcription in this dictation.  Possible transcriptional errors can occur using Animal nutritionist.   Signed,  Jacques DASEN. Odis Wickey, MD   Outpatient Encounter Medications as of 10/01/2024  Medication Sig   allopurinol  (ZYLOPRIM ) 300 MG tablet TAKE 1 TABLET BY MOUTH DAILY   atorvastatin  (LIPITOR) 80 MG tablet Take 1 tablet (80 mg total) by mouth daily.   betamethasone  dipropionate 0.05 % cream Apply topically 2 (two) times daily.   clobetasol  (TEMOVATE ) 0.05 % external solution Patient to mix solution in jar of CeraVe Cream. Apply all over neck and back twice daily until itchy rash improved. Avoid face, groin, underarms.   diclofenac  Sodium (VOLTAREN ) 1 % GEL Apply 2 g topically 4 (four) times daily as needed.   EPINEPHrine  0.3 mg/0.3 mL IJ SOAJ injection    ferrous sulfate  325 (65 FE) MG tablet Take 325 mg by mouth daily.  fluocinonide  (LIDEX ) 0.05 % external solution Apply 1 application topically daily as needed.   fluticasone  (FLONASE ) 50 MCG/ACT nasal spray Place 1 spray into both nostrils daily.   hydrochlorothiazide  (HYDRODIURIL ) 25 MG tablet TAKE 1 TABLET BY MOUTH DAILY   ketoconazole  (NIZORAL ) 2 % shampoo Apply to scalp and behind ears daily as needed for flares, left sit several minutes before rinsing.   levocetirizine (XYZAL) 5 MG tablet Take 5 mg by mouth every evening.   levothyroxine  (SYNTHROID ) 50 MCG tablet Take 1 tablet (50 mcg total) by mouth daily before breakfast.   nitroGLYCERIN  (NITROSTAT ) 0.4 MG SL tablet Place 1 tablet (0.4 mg total) under the tongue every 5 (five) minutes as needed for chest pain.   Omega-3 Fatty Acids (FISH OIL) 1200 MG CAPS Take 1 capsule by mouth daily.   omeprazole (PRILOSEC) 20 MG capsule Take 20 mg by mouth as needed.   pimecrolimus  (ELIDEL ) 1 % cream Apply topically 2 (two) times daily. Apply 1-2 times a day to eyebrows and  around ears as needed for rash.   predniSONE  (DELTASONE ) 20 MG tablet 2 tabs po daily for 5 days, then 1 tab po daily for 5 days   [DISCONTINUED] albuterol  (VENTOLIN  HFA) 108 (90 Base) MCG/ACT inhaler Inhale 2 puffs into the lungs every 6 (six) hours as needed for wheezing or shortness of breath.   albuterol  (VENTOLIN  HFA) 108 (90 Base) MCG/ACT inhaler Inhale 2 puffs into the lungs every 6 (six) hours as needed for wheezing or shortness of breath.   [EXPIRED] triamcinolone  acetonide (KENALOG -40) injection 40 mg    No facility-administered encounter medications on file as of 10/01/2024.   "

## 2024-10-01 ENCOUNTER — Ambulatory Visit: Admitting: Family Medicine

## 2024-10-01 ENCOUNTER — Ambulatory Visit
Admission: RE | Admit: 2024-10-01 | Discharge: 2024-10-01 | Disposition: A | Source: Ambulatory Visit | Attending: Family Medicine | Admitting: Family Medicine

## 2024-10-01 ENCOUNTER — Encounter: Payer: Self-pay | Admitting: Family Medicine

## 2024-10-01 VITALS — BP 104/80 | HR 75 | Temp 98.0°F | Ht 65.47 in | Wt 206.5 lb

## 2024-10-01 DIAGNOSIS — M25561 Pain in right knee: Secondary | ICD-10-CM | POA: Diagnosis not present

## 2024-10-01 DIAGNOSIS — M542 Cervicalgia: Secondary | ICD-10-CM | POA: Diagnosis not present

## 2024-10-01 DIAGNOSIS — M1711 Unilateral primary osteoarthritis, right knee: Secondary | ICD-10-CM | POA: Diagnosis not present

## 2024-10-01 MED ORDER — PREDNISONE 20 MG PO TABS: ORAL_TABLET | ORAL | 0 refills | Status: AC

## 2024-10-01 MED ORDER — ALBUTEROL SULFATE HFA 108 (90 BASE) MCG/ACT IN AERS: 2.0000 | INHALATION_SPRAY | Freq: Four times a day (QID) | RESPIRATORY_TRACT | 2 refills | Status: AC | PRN

## 2024-10-01 MED ORDER — TRIAMCINOLONE ACETONIDE 40 MG/ML IJ SUSP
40.0000 mg | Freq: Once | INTRAMUSCULAR | Status: AC
  Administered 2024-10-01: 40 mg via INTRA_ARTICULAR

## 2024-10-03 ENCOUNTER — Encounter: Payer: Self-pay | Admitting: Family Medicine

## 2024-10-17 ENCOUNTER — Ambulatory Visit: Payer: Self-pay | Admitting: Family Medicine

## 2025-02-20 ENCOUNTER — Encounter: Admitting: Family Medicine
